# Patient Record
Sex: Female | Born: 1975 | Race: Black or African American | Hispanic: No | Marital: Single | Smoking: Never smoker
Health system: Southern US, Community
[De-identification: ages and names within clinical notes are randomized; demographics above are authoritative.]

## PROBLEM LIST (undated history)

## (undated) DIAGNOSIS — E039 Hypothyroidism, unspecified: Secondary | ICD-10-CM

## (undated) DIAGNOSIS — K219 Gastro-esophageal reflux disease without esophagitis: Secondary | ICD-10-CM

## (undated) DIAGNOSIS — D649 Anemia, unspecified: Secondary | ICD-10-CM

## (undated) DIAGNOSIS — Z9289 Personal history of other medical treatment: Secondary | ICD-10-CM

## (undated) DIAGNOSIS — G4730 Sleep apnea, unspecified: Secondary | ICD-10-CM

## (undated) DIAGNOSIS — R011 Cardiac murmur, unspecified: Secondary | ICD-10-CM

## (undated) DIAGNOSIS — T7840XA Allergy, unspecified, initial encounter: Secondary | ICD-10-CM

## (undated) DIAGNOSIS — J302 Other seasonal allergic rhinitis: Secondary | ICD-10-CM

## (undated) DIAGNOSIS — M13169 Monoarthritis, not elsewhere classified, unspecified knee: Secondary | ICD-10-CM

## (undated) DIAGNOSIS — E538 Deficiency of other specified B group vitamins: Secondary | ICD-10-CM

## (undated) DIAGNOSIS — Z8489 Family history of other specified conditions: Secondary | ICD-10-CM

## (undated) HISTORY — DX: Anemia, unspecified: D64.9

## (undated) HISTORY — DX: Gastro-esophageal reflux disease without esophagitis: K21.9

## (undated) HISTORY — DX: Monoarthritis, not elsewhere classified, unspecified knee: M13.169

## (undated) HISTORY — DX: Allergy, unspecified, initial encounter: T78.40XA

## (undated) HISTORY — DX: Deficiency of other specified B group vitamins: E53.8

---

## 1998-05-25 ENCOUNTER — Emergency Department (HOSPITAL_COMMUNITY): Admission: EM | Admit: 1998-05-25 | Discharge: 1998-05-25 | Payer: Self-pay

## 1998-06-16 ENCOUNTER — Encounter: Admission: RE | Admit: 1998-06-16 | Discharge: 1998-09-14 | Payer: Self-pay

## 1998-11-17 ENCOUNTER — Emergency Department (HOSPITAL_COMMUNITY): Admission: EM | Admit: 1998-11-17 | Discharge: 1998-11-17 | Payer: Self-pay | Admitting: Emergency Medicine

## 1998-11-18 ENCOUNTER — Encounter: Admission: RE | Admit: 1998-11-18 | Discharge: 1999-02-16 | Payer: Self-pay | Admitting: Family Medicine

## 1999-11-13 HISTORY — PX: GASTRIC BYPASS: SHX52

## 2001-04-11 ENCOUNTER — Encounter: Payer: Self-pay | Admitting: Family Medicine

## 2001-04-11 ENCOUNTER — Encounter: Admission: RE | Admit: 2001-04-11 | Discharge: 2001-04-11 | Payer: Self-pay | Admitting: Family Medicine

## 2001-04-28 ENCOUNTER — Encounter: Admission: RE | Admit: 2001-04-28 | Discharge: 2001-07-27 | Payer: Self-pay | Admitting: Family Medicine

## 2002-11-12 DIAGNOSIS — Z9289 Personal history of other medical treatment: Secondary | ICD-10-CM

## 2002-11-12 HISTORY — PX: ABDOMINOPLASTY: SHX5355

## 2002-11-12 HISTORY — PX: REDUCTION MAMMAPLASTY: SUR839

## 2002-11-12 HISTORY — DX: Personal history of other medical treatment: Z92.89

## 2003-09-28 DIAGNOSIS — Z9884 Bariatric surgery status: Secondary | ICD-10-CM | POA: Insufficient documentation

## 2003-11-13 HISTORY — PX: BREAST REDUCTION SURGERY: SHX8

## 2003-12-16 ENCOUNTER — Other Ambulatory Visit: Admission: RE | Admit: 2003-12-16 | Discharge: 2003-12-16 | Payer: Self-pay | Admitting: Family Medicine

## 2006-06-13 ENCOUNTER — Other Ambulatory Visit: Admission: RE | Admit: 2006-06-13 | Discharge: 2006-06-13 | Payer: Self-pay | Admitting: Family Medicine

## 2006-06-13 ENCOUNTER — Ambulatory Visit: Payer: Self-pay | Admitting: Family Medicine

## 2006-07-03 ENCOUNTER — Ambulatory Visit: Payer: Self-pay | Admitting: Gastroenterology

## 2006-07-04 ENCOUNTER — Ambulatory Visit (HOSPITAL_COMMUNITY): Admission: RE | Admit: 2006-07-04 | Discharge: 2006-07-04 | Payer: Self-pay | Admitting: Gastroenterology

## 2006-07-08 ENCOUNTER — Ambulatory Visit: Payer: Self-pay | Admitting: Gastroenterology

## 2006-07-12 ENCOUNTER — Ambulatory Visit: Payer: Self-pay | Admitting: Cardiology

## 2008-01-21 ENCOUNTER — Telehealth (INDEPENDENT_AMBULATORY_CARE_PROVIDER_SITE_OTHER): Payer: Self-pay | Admitting: *Deleted

## 2011-02-01 ENCOUNTER — Other Ambulatory Visit: Payer: Self-pay | Admitting: Gastroenterology

## 2011-02-02 ENCOUNTER — Other Ambulatory Visit: Payer: Self-pay | Admitting: Gastroenterology

## 2011-02-02 ENCOUNTER — Ambulatory Visit
Admission: RE | Admit: 2011-02-02 | Discharge: 2011-02-02 | Disposition: A | Discharge status: Home / Self Care | Payer: BC Managed Care – PPO | Source: Ambulatory Visit | Attending: Gastroenterology | Admitting: Gastroenterology

## 2011-09-25 ENCOUNTER — Ambulatory Visit: Payer: BC Managed Care – PPO | Attending: Obstetrics & Gynecology | Admitting: Physical Therapy

## 2011-09-25 DIAGNOSIS — R5381 Other malaise: Secondary | ICD-10-CM | POA: Insufficient documentation

## 2011-09-25 DIAGNOSIS — M2550 Pain in unspecified joint: Secondary | ICD-10-CM | POA: Insufficient documentation

## 2011-09-25 DIAGNOSIS — IMO0001 Reserved for inherently not codable concepts without codable children: Secondary | ICD-10-CM | POA: Insufficient documentation

## 2011-09-25 DIAGNOSIS — M6281 Muscle weakness (generalized): Secondary | ICD-10-CM | POA: Insufficient documentation

## 2011-10-09 ENCOUNTER — Ambulatory Visit: Payer: BC Managed Care – PPO | Admitting: Physical Therapy

## 2011-10-23 ENCOUNTER — Ambulatory Visit: Payer: BC Managed Care – PPO | Attending: Obstetrics & Gynecology | Admitting: Physical Therapy

## 2011-10-23 DIAGNOSIS — M6281 Muscle weakness (generalized): Secondary | ICD-10-CM | POA: Insufficient documentation

## 2011-10-23 DIAGNOSIS — IMO0001 Reserved for inherently not codable concepts without codable children: Secondary | ICD-10-CM | POA: Insufficient documentation

## 2011-10-23 DIAGNOSIS — M2550 Pain in unspecified joint: Secondary | ICD-10-CM | POA: Insufficient documentation

## 2011-10-23 DIAGNOSIS — R5381 Other malaise: Secondary | ICD-10-CM | POA: Insufficient documentation

## 2011-11-08 ENCOUNTER — Ambulatory Visit: Payer: BC Managed Care – PPO | Admitting: Physical Therapy

## 2011-11-20 ENCOUNTER — Ambulatory Visit: Payer: BC Managed Care – PPO | Attending: Obstetrics & Gynecology | Admitting: Physical Therapy

## 2011-11-20 DIAGNOSIS — M6281 Muscle weakness (generalized): Secondary | ICD-10-CM | POA: Insufficient documentation

## 2011-11-20 DIAGNOSIS — IMO0001 Reserved for inherently not codable concepts without codable children: Secondary | ICD-10-CM | POA: Insufficient documentation

## 2011-11-20 DIAGNOSIS — M2550 Pain in unspecified joint: Secondary | ICD-10-CM | POA: Insufficient documentation

## 2011-11-20 DIAGNOSIS — R5381 Other malaise: Secondary | ICD-10-CM | POA: Insufficient documentation

## 2011-11-28 ENCOUNTER — Ambulatory Visit: Payer: BC Managed Care – PPO | Admitting: Physical Therapy

## 2011-12-05 ENCOUNTER — Ambulatory Visit: Payer: BC Managed Care – PPO | Admitting: Physical Therapy

## 2011-12-11 ENCOUNTER — Ambulatory Visit: Payer: BC Managed Care – PPO | Admitting: Physical Therapy

## 2011-12-18 ENCOUNTER — Ambulatory Visit: Payer: BC Managed Care – PPO | Attending: Obstetrics & Gynecology | Admitting: Physical Therapy

## 2011-12-18 DIAGNOSIS — IMO0001 Reserved for inherently not codable concepts without codable children: Secondary | ICD-10-CM | POA: Insufficient documentation

## 2011-12-18 DIAGNOSIS — M6281 Muscle weakness (generalized): Secondary | ICD-10-CM | POA: Insufficient documentation

## 2011-12-18 DIAGNOSIS — M2550 Pain in unspecified joint: Secondary | ICD-10-CM | POA: Insufficient documentation

## 2011-12-18 DIAGNOSIS — R5381 Other malaise: Secondary | ICD-10-CM | POA: Insufficient documentation

## 2011-12-25 ENCOUNTER — Ambulatory Visit: Payer: BC Managed Care – PPO | Admitting: Physical Therapy

## 2012-01-08 ENCOUNTER — Ambulatory Visit: Payer: BC Managed Care – PPO | Admitting: Physical Therapy

## 2012-01-15 ENCOUNTER — Ambulatory Visit: Payer: BC Managed Care – PPO | Attending: Obstetrics & Gynecology | Admitting: Physical Therapy

## 2012-01-15 DIAGNOSIS — IMO0001 Reserved for inherently not codable concepts without codable children: Secondary | ICD-10-CM | POA: Insufficient documentation

## 2012-01-15 DIAGNOSIS — M2550 Pain in unspecified joint: Secondary | ICD-10-CM | POA: Insufficient documentation

## 2012-01-15 DIAGNOSIS — M6281 Muscle weakness (generalized): Secondary | ICD-10-CM | POA: Insufficient documentation

## 2012-01-15 DIAGNOSIS — R5381 Other malaise: Secondary | ICD-10-CM | POA: Insufficient documentation

## 2012-11-12 HISTORY — PX: WISDOM TOOTH EXTRACTION: SHX21

## 2013-02-02 ENCOUNTER — Encounter: Payer: Self-pay | Admitting: Obstetrics & Gynecology

## 2013-02-02 DIAGNOSIS — M25559 Pain in unspecified hip: Secondary | ICD-10-CM

## 2013-03-26 ENCOUNTER — Encounter: Payer: Self-pay | Admitting: Obstetrics & Gynecology

## 2013-03-26 ENCOUNTER — Ambulatory Visit (INDEPENDENT_AMBULATORY_CARE_PROVIDER_SITE_OTHER): Payer: Managed Care, Other (non HMO) | Admitting: Obstetrics & Gynecology

## 2013-03-26 VITALS — BP 116/80 | HR 36 | Resp 12 | Wt 245.0 lb

## 2013-03-26 DIAGNOSIS — N949 Unspecified condition associated with female genital organs and menstrual cycle: Secondary | ICD-10-CM

## 2013-03-26 DIAGNOSIS — M25559 Pain in unspecified hip: Secondary | ICD-10-CM

## 2013-03-26 DIAGNOSIS — N938 Other specified abnormal uterine and vaginal bleeding: Secondary | ICD-10-CM

## 2013-03-26 MED ORDER — NORETHIN ACE-ETH ESTRAD-FE 1-20 MG-MCG(24) PO CHEW: 1.0000 | CHEWABLE_TABLET | Freq: Every day | ORAL | Status: DC

## 2013-03-26 NOTE — Patient Instructions (Signed)
Please call if you have any new issues or problems.    Don't forget to take the information to the pharmacy about the savings.  BIN # [id number], PCN# [id number], Grp# [id number], ID# [id number]

## 2013-03-26 NOTE — Progress Notes (Signed)
37 y.o. Single African American G0P0000here for evaluation of irregular bleeding and pelvic pain.  She started Minastrin in March and has been through two packs.  Cycles are much lighter, mostly just spotting when wipes.  Lasts three days.  Cramping is much better.  All of the pain that was occuring not during her cycle is also much better.    Just bought her first house.  Closed on Monday.  Off work this week to move into her place.  This has been a busy week for her.    Patient taking medication as prescribed. Denies missed pills: yes, headaches: occasional but she is working more just in front of a computer, nausea no, DVT warning signs or symptoms: no, breakthrough bleeding: no, or other changes no.  Keeping menses calendar. No other health issues today.  Down 12 pounds since March.  Exercising regularly.  Not doing weights as much.    O: Healthy female, WD WN Affect: normal orientation X 3    A:  DUB and pelvic pain, much improved on Minastrin  P: Continue Minastrin.  3 samples given.  Pt will call and let me know if needs 90 day supply through [facility].   10 minutes spent with patient with >50% of time spent in face to face counseling.  RV

## 2013-06-29 ENCOUNTER — Telehealth: Payer: Self-pay | Admitting: Gynecology

## 2013-06-29 ENCOUNTER — Telehealth: Payer: Self-pay | Admitting: Obstetrics & Gynecology

## 2013-06-29 NOTE — Telephone Encounter (Signed)
Needs refill of Minastrin called into [facility].

## 2013-06-29 NOTE — Telephone Encounter (Signed)
Left Message To Call Back  

## 2013-06-29 NOTE — Telephone Encounter (Signed)
Minastrin 24 fe #28/12 refills was sent through to [facility] 03/26/13 #28/12rfs, s/w patient she is wanting to have her rx filled at [facility], told patient that she can call [facility] and have her rx transferred. Patient said she would do this, and is aware to call us back if she has any issues or problems.

## 2014-04-02 ENCOUNTER — Encounter: Payer: Self-pay | Admitting: Obstetrics & Gynecology

## 2014-04-02 ENCOUNTER — Ambulatory Visit (INDEPENDENT_AMBULATORY_CARE_PROVIDER_SITE_OTHER): Payer: Managed Care, Other (non HMO) | Admitting: Obstetrics & Gynecology

## 2014-04-02 VITALS — BP 124/78 | HR 58 | Resp 16 | Ht 66.0 in | Wt 250.4 lb

## 2014-04-02 DIAGNOSIS — N839 Noninflammatory disorder of ovary, fallopian tube and broad ligament, unspecified: Secondary | ICD-10-CM

## 2014-04-02 DIAGNOSIS — R790 Abnormal level of blood mineral: Secondary | ICD-10-CM

## 2014-04-02 DIAGNOSIS — R7989 Other specified abnormal findings of blood chemistry: Secondary | ICD-10-CM

## 2014-04-02 DIAGNOSIS — R5381 Other malaise: Secondary | ICD-10-CM

## 2014-04-02 DIAGNOSIS — Z01419 Encounter for gynecological examination (general) (routine) without abnormal findings: Secondary | ICD-10-CM

## 2014-04-02 DIAGNOSIS — Z0000 Encounter for general adult medical examination without abnormal findings: Secondary | ICD-10-CM

## 2014-04-02 DIAGNOSIS — Z124 Encounter for screening for malignant neoplasm of cervix: Secondary | ICD-10-CM

## 2014-04-02 DIAGNOSIS — R5383 Other fatigue: Secondary | ICD-10-CM

## 2014-04-02 DIAGNOSIS — N838 Other noninflammatory disorders of ovary, fallopian tube and broad ligament: Secondary | ICD-10-CM

## 2014-04-02 LAB — TSH: TSH: 0.629 u[IU]/mL (ref 0.350–4.500)

## 2014-04-02 LAB — POCT URINALYSIS DIPSTICK
BILIRUBIN UA: NEGATIVE
Blood, UA: NEGATIVE
Glucose, UA: NEGATIVE
KETONES UA: NEGATIVE
LEUKOCYTES UA: NEGATIVE
Nitrite, UA: NEGATIVE
PROTEIN UA: NEGATIVE
Urobilinogen, UA: NEGATIVE
pH, UA: 5

## 2014-04-02 LAB — COMPREHENSIVE METABOLIC PANEL
ALBUMIN: 3.5 g/dL (ref 3.5–5.2)
ALT: 16 U/L (ref 0–35)
AST: 16 U/L (ref 0–37)
Alkaline Phosphatase: 72 U/L (ref 39–117)
BUN: 9 mg/dL (ref 6–23)
CALCIUM: 9.2 mg/dL (ref 8.4–10.5)
CHLORIDE: 107 meq/L (ref 96–112)
CO2: 23 meq/L (ref 19–32)
Creat: 0.87 mg/dL (ref 0.50–1.10)
Glucose, Bld: 83 mg/dL (ref 70–99)
POTASSIUM: 4 meq/L (ref 3.5–5.3)
Sodium: 139 mEq/L (ref 135–145)
TOTAL PROTEIN: 7 g/dL (ref 6.0–8.3)
Total Bilirubin: 0.4 mg/dL (ref 0.2–1.2)

## 2014-04-02 LAB — IRON: IRON: 84 ug/dL (ref 42–145)

## 2014-04-02 LAB — FERRITIN: FERRITIN: 5 ng/mL — AB (ref 10–291)

## 2014-04-02 LAB — VITAMIN B12: Vitamin B-12: 280 pg/mL (ref 211–911)

## 2014-04-02 LAB — HEMOGLOBIN, FINGERSTICK: HEMOGLOBIN, FINGERSTICK: 12.2 g/dL (ref 12.0–16.0)

## 2014-04-02 MED ORDER — NORETHIN ACE-ETH ESTRAD-FE 1-20 MG-MCG(24) PO CHEW: 1.0000 | CHEWABLE_TABLET | Freq: Every day | ORAL | Status: DC

## 2014-04-02 NOTE — Progress Notes (Signed)
38 y.o. G2P0 SingleAfrican AmericanF here for annual exam.  Is much better!!  Would like to stay on OCP.  Trying to figure out if she can do mail order.  Having some more fatigue.     Patient's last menstrual period was 01/10/2013.          Sexually active: no  The current method of family planning is OCP (estrogen/progesterone).    Exercising: yes  weights, walking, and running Smoker:  no  Health Maintenance: Pap:  01/14/12 WNL/negative HR HPV History of abnormal Pap:  no MMG:  Per patient ? 02/09/11-normal Colonoscopy:  none BMD:   none TDaP:  01/11/11 Screening Labs: today, Hb today: 12.2, Urine today: negative   reports that she has never smoked. She has never used smokeless tobacco. She reports that she does not drink alcohol or use illicit drugs.  Past Medical History  Diagnosis Date  . Anemia     and low B12    Past Surgical History  Procedure Laterality Date  . Gastric bypass  2001  . Abdominoplasty  2004  . Breast reduction surgery  2005    Current Outpatient Prescriptions  Medication Sig Dispense Refill  . montelukast (SINGULAIR) 10 MG tablet Take 10 mg by mouth at bedtime.      . Norethin Ace-Eth Estrad-FE (MINASTRIN 24 FE) 1-20 MG-MCG(24) CHEW Chew 1 tablet by mouth daily.  28 tablet  12  . ferrous sulfate 220 (44 FE) MG/5ML solution Take 220 mg by mouth daily.      . Vitamin D, Ergocalciferol, (DRISDOL) 50000 UNITS CAPS Take 50,000 Units by mouth. Twice weekly       No current facility-administered medications for this visit.    Family History  Problem Relation Age of Onset  . CVA Maternal Grandmother   . Hypertension Brother   . Diabetes Brother   . Heart disease Brother   . Diabetes Sister   . Hypertension Sister   . Diabetes Sister   . Hypertension Sister   . Diabetes Brother   . Hypertension Brother   . Heart disease Brother   . Diabetes Brother   . Hypertension Brother   . Heart disease Brother     ROS:  Pertinent items are noted in HPI.   Otherwise, a comprehensive ROS was negative.  Exam:   BP 124/78  Pulse 58  Resp 16  Ht 5\' 6"  (1.676 m)  Wt 250 lb 6.4 oz (113.581 kg)  BMI 40.44 kg/m2  LMP 01/10/2013 Height: 5\' 6"  (167.6 cm)  Ht Readings from Last 3 Encounters:  04/02/14 5\' 6"  (1.676 m)    General appearance: alert, cooperative and appears stated age Head: Normocephalic, without obvious abnormality, atraumatic Neck: no adenopathy, supple, symmetrical, trachea midline and thyroid normal to inspection and palpation Lungs: clear to auscultation bilaterally Breasts: normal appearance, no masses or tenderness Heart: regular rate and rhythm Abdomen: soft, non-tender; bowel sounds normal; no masses,  no organomegaly Extremities: extremities normal, atraumatic, no cyanosis or edema Skin: Skin color, texture, turgor normal. No rashes or lesions Lymph nodes: Cervical, supraclavicular, and axillary nodes normal. No abnormal inguinal nodes palpated Neurologic: Grossly normal   Pelvic: External genitalia:  no lesions              Urethra:  normal appearing urethra with no masses, tenderness or lesions              Bartholins and Skenes: normal  Vagina: normal appearing vagina with normal color and discharge, no lesions              Cervix: no lesions              Pap taken: yes Bimanual Exam:  Uterus:  normal size, contour, position, consistency, mobility, non-tender              Adnexa: fullness on right size, about 4cm               Rectovaginal: Confirms               Anus:  normal sphincter tone, no lesions  A:  Well Woman with normal exam Amenorrhea on OCPs Chronic pelvic pain, improved with current OCP Fatigue Right enlarged ovary?  H/o 3cm right ovarian cyst on 1/14 PUS  P:   Mammogram recommended.  Information given for starting screening pap smear only  Iron studies, Vit B12 CMP, TSH, Vit D Rx for Minastrin 24 to pharmacy.  90 day supply given. Will need rx for Vit D.  Will wait to see  lab results. Recheck PUS to assess size of ovary. return annually or prn  An After Visit Summary was printed and given to the patient.

## 2014-04-03 LAB — VITAMIN D 25 HYDROXY (VIT D DEFICIENCY, FRACTURES): VIT D 25 HYDROXY: 33 ng/mL (ref 30–89)

## 2014-04-06 ENCOUNTER — Telehealth: Payer: Self-pay | Admitting: Obstetrics & Gynecology

## 2014-04-06 ENCOUNTER — Telehealth: Payer: Self-pay

## 2014-04-06 MED ORDER — FERROUS SULFATE 220 (44 FE) MG/5ML PO ELIX: 220.0000 mg | ORAL_SOLUTION | Freq: Every day | ORAL | Status: DC

## 2014-04-06 NOTE — Telephone Encounter (Signed)
Spoke with patient. Advised that per benefit quote received, she will be responsible for  $378.06 when she comes in for pus. Patient agreeable. Scheduled pus. Advised of cx policy.

## 2014-04-06 NOTE — Telephone Encounter (Signed)
Lmtcb//kn 

## 2014-04-06 NOTE — Addendum Note (Signed)
Addended by: Megan Salon on: 04/06/2014 07:20 AM   Modules accepted: Orders

## 2014-04-06 NOTE — Addendum Note (Signed)
Addended by: Megan Salon on: 04/06/2014 11:39 AM   Modules accepted: Orders

## 2014-04-06 NOTE — Telephone Encounter (Signed)
Message copied by Robley Fries on Tue Apr 06, 2014  8:55 AM ------      Message from: Megan Salon      Created: Tue Apr 06, 2014  7:19 AM       Inform CMP normal.  TSH normal.  Vit D normal.  B12 normal.  Ferriting (iron stores) very low.  Needs to take FeSo4 325mg  daily and repeat in 2 months.  Order placed.  If not a lot better then, may need IV iron infusion. ------

## 2014-04-07 LAB — IPS PAP SMEAR ONLY

## 2014-04-08 ENCOUNTER — Ambulatory Visit (INDEPENDENT_AMBULATORY_CARE_PROVIDER_SITE_OTHER): Payer: Managed Care, Other (non HMO) | Admitting: Obstetrics & Gynecology

## 2014-04-08 ENCOUNTER — Ambulatory Visit (INDEPENDENT_AMBULATORY_CARE_PROVIDER_SITE_OTHER): Payer: Managed Care, Other (non HMO)

## 2014-04-08 ENCOUNTER — Encounter: Payer: Self-pay | Admitting: Obstetrics & Gynecology

## 2014-04-08 VITALS — BP 120/78 | Ht 66.0 in | Wt 253.0 lb

## 2014-04-08 DIAGNOSIS — N838 Other noninflammatory disorders of ovary, fallopian tube and broad ligament: Secondary | ICD-10-CM

## 2014-04-08 DIAGNOSIS — N839 Noninflammatory disorder of ovary, fallopian tube and broad ligament, unspecified: Secondary | ICD-10-CM

## 2014-04-08 NOTE — Progress Notes (Signed)
38 yo G2A2 SAAF here for pelvic ultrasound due to palpable right adnexal mass noted on physical exam 04/08/14.  H/o ultrasound 11/24/12 here showing thin walled avascular cyst 33 x 27 x 36 mm on right side.  Patient's last menstrual period was 01/10/2013.  Sexually active:  no  Contraception: oral contraceptives (estrogen/progesterone)  FINDINGS: UTERUS: 5.7 x 4.0 x 2.3cm.  No masses noted. EMS: 3.18mm ADNEXA:   Left ovary 2.6 x 1.4 x 1.3cm   Right ovary 2.2 x 1.9 x 1.7cm with 3.6 x 2.9 x 3.4cm avascular cyst. CUL DE SAC: no free fluid  Images reviewed with patient.  Comparison made to prior ultrasound done 11/24/12.  As this has not changed significantly in size, feel monitoring vs surgical excision is appropriate.  Pt continues to have pain on right side, it just isn't as severe as a year ago.  She would very much like this removed as she does nto want to proceed with continued observation.  Procedure, incision locations, risks and benefits including bleeding, infection, bowel/bladder/ureteral injury discussed.  Pt in agreement with proceeding.    Assessment:  Ovarian mass Plan: Will test ca-125 today.  If normal, proceed with surgery scheduling.  ~20 minutes spent with patient >50% of time was in face to face discussion of above.

## 2014-04-09 LAB — CA 125: CA 125: 4.3 U/mL (ref 0.0–30.2)

## 2014-04-12 ENCOUNTER — Telehealth: Payer: Self-pay | Admitting: *Deleted

## 2014-04-12 NOTE — Telephone Encounter (Signed)
Call to patient, LMTCB

## 2014-04-12 NOTE — Telephone Encounter (Signed)
Message copied by Jaymes Graff on Mon Apr 12, 2014  4:39 PM ------      Message from: Megan Salon      Created: Fri Apr 09, 2014  6:39 PM       Inform pt this is normal.  Ok to proceed with scheduling laparoscopic ovarian cyst removal. ------

## 2014-04-13 NOTE — Telephone Encounter (Signed)
Patient notified of all results.//kn 

## 2014-04-13 NOTE — Telephone Encounter (Signed)
Left message for patient to call back. Need to go over surgery benefits. °

## 2014-04-13 NOTE — Telephone Encounter (Signed)
Returning a call to Sheri [name]. °

## 2014-04-13 NOTE — Telephone Encounter (Signed)
Patient returning Sally's call. °

## 2014-04-13 NOTE — Telephone Encounter (Signed)
Patient notified of normal CA125 and ok to proceed with scheduling surgery.  Per patient request, also reviewed iron results and ferritin from last week. Patient is still waiting for liquid iron RX to arrive from mail order.  She is interested in date of late June or early July but will take whatever we can get. Advised Gabriel Cirri will check insurance and call her back.

## 2014-04-13 NOTE — Telephone Encounter (Signed)
Spoke with patient. Advised that per benefit quote received, she will be responsible for $1035.68. Advised that payment is due in full at least 2 weeks prior to scheduled surgery date. Patient agreeable. She will be waiting to hear from [facility] regarding scheduling.

## 2014-04-14 MED ORDER — FERROUS SULFATE 220 (44 FE) MG/5ML PO ELIX: 220.0000 mg | ORAL_SOLUTION | Freq: Every day | ORAL | Status: DC

## 2014-04-14 NOTE — Telephone Encounter (Signed)
Spoke with patient. Patient states that she had a rx for iron sent in by Dr.Miller that was sent over to [facility] but they will not fill it. Patient would like rx sent over the [facility] in [facility]. Patient states "I have been waiting about a week and I do not want to have to have an infusion before surgery." Advised patient that rx was reordered to pharmacy of choice. Advised patient to call back with any further needs or questions. Patient agreeable.

## 2014-04-19 NOTE — Telephone Encounter (Signed)
Pt says she is calling the nurse or Gay Filler to schedule surgery.

## 2014-04-19 NOTE — Telephone Encounter (Signed)
Per DPR, Ok to leave message. LM returning call and apolgized for delay.Working on request and will be back in touch with her mid-week.  Case request sent to hosp for 05-17-14, first available.

## 2014-04-19 NOTE — Telephone Encounter (Signed)
Routing to [facility] for surgery scheduling.

## 2014-04-26 NOTE — Telephone Encounter (Signed)
Pt calling to schedule surgery.

## 2014-04-29 NOTE — Telephone Encounter (Signed)
Call to patient. Advised of surgical date of 05-17-14 at 22 at [facility].  Surgery instruction sheet reviewed and mailed to patient.  Surgery consult scheduled for week prior 05-11-14 so you can recheck iron if needed.  Will she need any type of bowel prep?

## 2014-04-29 NOTE — Telephone Encounter (Signed)
Call to patient to notify, LMTCB.

## 2014-04-29 NOTE — Telephone Encounter (Signed)
No bowel prep.  Thank you.

## 2014-04-30 NOTE — Telephone Encounter (Signed)
*  corrected patient name on letter: Dear [name],   Your surgery is scheduled for May 17, 2014.  Upon requesting authorization for your surgery, your insurance company has informed us that they will cover 60% of the charges after a $1700 deductible, and you will be responsible to pay approximately $1035.68  It is our office policy that this amount is paid in full two weeks prior to your surgery. Your payment is due on May 03, 2014.  If there is a balance due after your insurance company pays their portion, we will send you a bill.  If there is a refund due to you, we will send you a check within one month.  Payment may be made by cash, check, Visa, Nurse, learning disability. Payment can be made in the office or over the telephone.  If payment is not made two weeks prior to your surgery, we will have to reschedule your surgery.  The above fee includes only our fee for the surgery and does not include charges you may have from the facility, anesthesia or pathology.  If you have any questions, please call us at [phone].

## 2014-04-30 NOTE — Telephone Encounter (Signed)
April 30, 2014   Dear Ms. Marguerita Merles,   Your surgery is scheduled for May 17, 2014.  Upon requesting authorization for your surgery, your insurance company has informed us that they will cover 60% of the charges after a $1700 deductible, and you will be responsible to pay approximately $1035.68  It is our office policy that this amount is paid in full two weeks prior to your surgery. Your payment is due on May 03, 2014.  If there is a balance due after your insurance company pays their portion, we will send you a bill.  If there is a refund due to you, we will send you a check within one month.  Payment may be made by cash, check, Visa, Nurse, learning disability. Payment can be made in the office or over the telephone.  If payment is not made two weeks prior to your surgery, we will have to reschedule your surgery.  The above fee includes only our fee for the surgery and does not include charges you may have from the facility, anesthesia or pathology.  If you have any questions, please call us at [phone].

## 2014-04-30 NOTE — Telephone Encounter (Signed)
Call to patient, Per DPR, ok to  Leave detailed message on [phone]. LM calling to follow up from yesterday and advised dr [name] Heck said no bowel prep needed with the exception of NPO past midnight. Surgery instruction sheet mailed as confirmation. Call if questions. Encounter closed.

## 2014-05-05 ENCOUNTER — Encounter (HOSPITAL_COMMUNITY): Payer: Self-pay | Admitting: Pharmacist

## 2014-05-11 ENCOUNTER — Ambulatory Visit (INDEPENDENT_AMBULATORY_CARE_PROVIDER_SITE_OTHER): Payer: Managed Care, Other (non HMO) | Admitting: Obstetrics & Gynecology

## 2014-05-11 ENCOUNTER — Encounter: Payer: Self-pay | Admitting: Obstetrics & Gynecology

## 2014-05-11 ENCOUNTER — Telehealth: Payer: Self-pay | Admitting: Obstetrics & Gynecology

## 2014-05-11 VITALS — BP 104/64 | HR 60 | Resp 16 | Ht 66.0 in | Wt 250.4 lb

## 2014-05-11 DIAGNOSIS — N83209 Unspecified ovarian cyst, unspecified side: Secondary | ICD-10-CM

## 2014-05-11 MED ORDER — MORPHINE SULFATE 15 MG PO TABS: 15.0000 mg | ORAL_TABLET | ORAL | Status: DC | PRN

## 2014-05-11 MED ORDER — NORETHIN ACE-ETH ESTRAD-FE 1-20 MG-MCG(24) PO CHEW: 1.0000 | CHEWABLE_TABLET | Freq: Every day | ORAL | Status: DC

## 2014-05-11 NOTE — Progress Notes (Signed)
38 y.o. G2P0 SingleAfrican American female here for discussion of upcoming procedure.  Laparoscopic ovarian cystectomy planned due to persistent 4cm right ovarian cyst.  This has been imaged twice with no change in the cyst size.  Pt had RLQ pain that initiated the original evaluation.       Procedure discussed.  Incision locations discussed.  Risks and benefits discussed including but not limited to bleeding, 1% risk of receiving a  transfusion, infection, 3-4% risk of bowel/bladder/ureteral/vascular injury discussed as well as possible need for additional surgery if injury does occur discussed.  DVT/PE and rare risk of death discussed.  My actual complications with prior surgeries discussed.  Hernia formation discussed.  Positioning discussed.  Patient aware if pathology abnormal she may need additional treatment.  All questions answered.  Pt's mother was with her for entire discussion.   Ob Hx:   No LMP recorded. Patient is not currently having periods (Reason: Oral contraceptives).          Sexually active: No. Birth control: oral contraceptives (estrogen/progesterone) Last pap: 04/02/14 WNL Last MMG: none Tobacco: no Transfusion:  2001, after bypass  Past Surgical History  Procedure Laterality Date  . Gastric bypass  2001  . Abdominoplasty  2004  . Breast reduction surgery  2005    Past Medical History  Diagnosis Date  . Anemia     and low B12    Allergies: Aspirin  Current Outpatient Prescriptions  Medication Sig Dispense Refill  . ferrous sulfate 220 (44 FE) MG/5ML solution Take 5 mLs (220 mg total) by mouth daily.  480 mL  1  . montelukast (SINGULAIR) 10 MG tablet Take 10 mg by mouth at bedtime.      . Norethin Ace-Eth Estrad-FE (MINASTRIN 24 FE) 1-20 MG-MCG(24) CHEW Chew 1 tablet by mouth daily.  84 tablet  4   No current facility-administered medications for this visit.    ROS: A comprehensive review of systems was negative.  Exam:    BP 104/64  Pulse 60  Resp 16   Ht 5\' 6"  (1.676 m)  Wt 250 lb 6.4 oz (113.581 kg)  BMI 40.44 kg/m2  General appearance: alert and cooperative Head: Normocephalic, without obvious abnormality, atraumatic Neck: no adenopathy, supple, symmetrical, trachea midline and thyroid not enlarged, symmetric, no tenderness/mass/nodules Lungs: clear to auscultation bilaterally Heart: regular rate and rhythm, S1, S2 normal, no murmur, click, rub or gallop Abdomen: soft, non-tender; bowel sounds normal; no masses,  no organomegaly Extremities: extremities normal, atraumatic, no cyanosis or edema Skin: Skin color, texture, turgor normal. No rashes or lesions Lymph nodes: Cervical, supraclavicular, and axillary nodes normal. no inguinal nodes palpated Neurologic: Grossly normal  Pelvic: External genitalia:  no lesions              Urethra: normal appearing urethra with no masses, tenderness or lesions              Bartholins and Skenes: normal                 Vagina: normal appearing vagina with normal color and discharge, no lesions                A: 4cm right ovarian cyst, RLQ pain     P:  Laparoscopic right ovarian cystectomy planned Rx for MSIR Will check CBC and ferritin with pre-op labs Make sure before surgery, any metal in hair braids is removed/covered Medications/Vitamins reviewed.  Pt knows needs to stop Aleve.  ~25 minutes spent with patient >  50% of time was in face to face discussion of above.

## 2014-05-11 NOTE — Telephone Encounter (Signed)
Patient states she is bringing payment today

## 2014-05-11 NOTE — Telephone Encounter (Signed)
Left message for patient to call back. Need to collect her surgery payment of $1035.68.

## 2014-05-12 NOTE — Patient Instructions (Signed)
   Your procedure is scheduled on: Monday, July 6  Enter through the Main Entrance of [facility] at:  9 AM Pick up the phone at the desk and dial [phone] and inform us of your arrival.  Please call this number if you have any problems the morning of surgery: [phone]  Remember: Do not eat or drink after midnight: Sunday Take these medicines the morning of surgery with a SIP OF WATER:  Do not wear jewelry, make-up, or FINGER nail polish No metal in your hair or on your body. Do not wear lotions, powders, perfumes.  You may wear deodorant.  Do not bring valuables to the hospital. Contacts, dentures or bridgework may not be worn into surgery.  Patients discharged on the day of surgery will not be allowed to drive home.

## 2014-05-13 ENCOUNTER — Encounter (HOSPITAL_COMMUNITY)
Admission: RE | Admit: 2014-05-13 | Discharge: 2014-05-13 | Disposition: A | Discharge status: Home / Self Care | Payer: Managed Care, Other (non HMO) | Source: Ambulatory Visit | Attending: Obstetrics & Gynecology | Admitting: Obstetrics & Gynecology

## 2014-05-13 ENCOUNTER — Encounter (HOSPITAL_COMMUNITY): Payer: Self-pay

## 2014-05-13 ENCOUNTER — Telehealth: Payer: Self-pay | Admitting: Obstetrics & Gynecology

## 2014-05-13 DIAGNOSIS — Z01812 Encounter for preprocedural laboratory examination: Secondary | ICD-10-CM | POA: Insufficient documentation

## 2014-05-13 HISTORY — DX: Cardiac murmur, unspecified: R01.1

## 2014-05-13 HISTORY — DX: Other seasonal allergic rhinitis: J30.2

## 2014-05-13 HISTORY — DX: Hypothyroidism, unspecified: E03.9

## 2014-05-13 HISTORY — DX: Personal history of other medical treatment: Z92.89

## 2014-05-13 LAB — CBC
HEMATOCRIT: 38.8 % (ref 36.0–46.0)
Hemoglobin: 12.5 g/dL (ref 12.0–15.0)
MCH: 27.7 pg (ref 26.0–34.0)
MCHC: 32.2 g/dL (ref 30.0–36.0)
MCV: 86 fL (ref 78.0–100.0)
Platelets: 290 10*3/uL (ref 150–400)
RBC: 4.51 MIL/uL (ref 3.87–5.11)
RDW: 15.8 % — AB (ref 11.5–15.5)
WBC: 7 10*3/uL (ref 4.0–10.5)

## 2014-05-13 NOTE — Telephone Encounter (Signed)
Per Levada Dy from [facility], rx has already been called in #[phone] 1 refill  Encounter closed

## 2014-05-13 NOTE — Telephone Encounter (Signed)
Pt is requesting Minastrin 24 Fe to be called into [facility] @ [phone]

## 2014-05-13 NOTE — Patient Instructions (Addendum)
   Your procedure is scheduled on:  Monday, July 6  Enter through the [facility] at: 9 Am Pick up the phone at the desk and dial [phone] and inform us of your arrival.  Please call this number if you have any problems the morning of surgery: [phone]  Remember: Do not eat or drink after midnight: Sunday Take these medicines the morning of surgery with a SIP OF WATER:  liquid iron   Do not wear jewelry, make-up, or FINGER nail polish No metal in your hair or on your body. Do not wear lotions, powders, perfumes.  You may wear deodorant.  Do not bring valuables to the hospital. Contacts, dentures or bridgework may not be worn into surgery.   Patients discharged on the day of surgery will not be allowed to drive home.  Home with mother Sheri [name] cell [phone]

## 2014-05-13 NOTE — Telephone Encounter (Signed)
Dr. [name] Heck escribed rx refill to [facility] on 6/30. Sheri [name], they do not have rx. Called in at this time to Pharmacist Levada Dy and savings card faxed with fax confirmation received.  Patient aware and will pick up rx. Routing to provider for final review. Patient agreeable to disposition. Will close encounter

## 2014-05-13 NOTE — Telephone Encounter (Signed)
Pt is requesting Minastrin 24 Fe to be called into [facility] @ [phone]

## 2014-05-17 ENCOUNTER — Ambulatory Visit (HOSPITAL_COMMUNITY): Payer: Managed Care, Other (non HMO) | Admitting: Anesthesiology

## 2014-05-17 ENCOUNTER — Encounter: Payer: Self-pay | Admitting: Obstetrics & Gynecology

## 2014-05-17 ENCOUNTER — Ambulatory Visit (HOSPITAL_COMMUNITY)
Admission: RE | Admit: 2014-05-17 | Discharge: 2014-05-17 | Disposition: A | Discharge status: Home / Self Care | Payer: Managed Care, Other (non HMO) | Source: Ambulatory Visit | Attending: Obstetrics & Gynecology | Admitting: Obstetrics & Gynecology

## 2014-05-17 ENCOUNTER — Encounter (HOSPITAL_COMMUNITY): Payer: Self-pay | Admitting: Anesthesiology

## 2014-05-17 ENCOUNTER — Encounter (HOSPITAL_COMMUNITY): Payer: Managed Care, Other (non HMO) | Admitting: Anesthesiology

## 2014-05-17 ENCOUNTER — Encounter (HOSPITAL_COMMUNITY)
Admission: RE | Disposition: A | Discharge status: Home / Self Care | Payer: Self-pay | Source: Ambulatory Visit | Attending: Obstetrics & Gynecology

## 2014-05-17 DIAGNOSIS — E039 Hypothyroidism, unspecified: Secondary | ICD-10-CM | POA: Insufficient documentation

## 2014-05-17 DIAGNOSIS — N838 Other noninflammatory disorders of ovary, fallopian tube and broad ligament: Secondary | ICD-10-CM

## 2014-05-17 DIAGNOSIS — Z6841 Body Mass Index (BMI) 40.0 and over, adult: Secondary | ICD-10-CM | POA: Insufficient documentation

## 2014-05-17 DIAGNOSIS — D649 Anemia, unspecified: Secondary | ICD-10-CM | POA: Insufficient documentation

## 2014-05-17 DIAGNOSIS — K219 Gastro-esophageal reflux disease without esophagitis: Secondary | ICD-10-CM | POA: Insufficient documentation

## 2014-05-17 DIAGNOSIS — G8929 Other chronic pain: Secondary | ICD-10-CM | POA: Insufficient documentation

## 2014-05-17 DIAGNOSIS — Z823 Family history of stroke: Secondary | ICD-10-CM | POA: Insufficient documentation

## 2014-05-17 DIAGNOSIS — N9489 Other specified conditions associated with female genital organs and menstrual cycle: Secondary | ICD-10-CM

## 2014-05-17 DIAGNOSIS — Z9884 Bariatric surgery status: Secondary | ICD-10-CM | POA: Insufficient documentation

## 2014-05-17 DIAGNOSIS — N949 Unspecified condition associated with female genital organs and menstrual cycle: Secondary | ICD-10-CM | POA: Insufficient documentation

## 2014-05-17 DIAGNOSIS — N854 Malposition of uterus: Secondary | ICD-10-CM | POA: Insufficient documentation

## 2014-05-17 DIAGNOSIS — R011 Cardiac murmur, unspecified: Secondary | ICD-10-CM | POA: Insufficient documentation

## 2014-05-17 DIAGNOSIS — D3910 Neoplasm of uncertain behavior of unspecified ovary: Secondary | ICD-10-CM

## 2014-05-17 DIAGNOSIS — N7013 Chronic salpingitis and oophoritis: Secondary | ICD-10-CM | POA: Insufficient documentation

## 2014-05-17 HISTORY — PX: LAPAROSCOPIC OVARIAN CYSTECTOMY: SHX6248

## 2014-05-17 LAB — PREGNANCY, URINE: Preg Test, Ur: NEGATIVE

## 2014-05-17 LAB — FERRITIN: FERRITIN: 13 ng/mL (ref 10–291)

## 2014-05-17 SURGERY — EXCISION, CYST, OVARY, LAPAROSCOPIC
Anesthesia: General | Site: Abdomen | Laterality: Right

## 2014-05-17 MED ORDER — LIDOCAINE HCL (CARDIAC) 20 MG/ML IV SOLN
INTRAVENOUS | Status: DC | PRN
  Administered 2014-05-17: 70 mg via INTRAVENOUS
  Administered 2014-05-17: 30 mg via INTRAVENOUS

## 2014-05-17 MED ORDER — CEFAZOLIN SODIUM-DEXTROSE 2-3 GM-% IV SOLR: 2.0000 g | INTRAVENOUS | Status: DC

## 2014-05-17 MED ORDER — CEFAZOLIN SODIUM-DEXTROSE 2-3 GM-% IV SOLR
INTRAVENOUS | Status: AC
Start: 2014-05-17 — End: 2014-05-17
  Administered 2014-05-17: 2 g via INTRAVENOUS
  Filled 2014-05-17: qty 50

## 2014-05-17 MED ORDER — METOCLOPRAMIDE HCL 5 MG/ML IJ SOLN: 10.0000 mg | Freq: Once | INTRAMUSCULAR | Status: DC | PRN

## 2014-05-17 MED ORDER — OXYCODONE HCL 5 MG/5ML PO SOLN: 5.0000 mg | Freq: Once | ORAL | Status: DC | PRN

## 2014-05-17 MED ORDER — LACTATED RINGERS IV SOLN
INTRAVENOUS | Status: DC
  Administered 2014-05-17 (×2): via INTRAVENOUS

## 2014-05-17 MED ORDER — NEOSTIGMINE METHYLSULFATE 10 MG/10ML IV SOLN
INTRAVENOUS | Status: DC | PRN
  Administered 2014-05-17: 3 mg via INTRAVENOUS

## 2014-05-17 MED ORDER — OXYCODONE HCL 5 MG PO TABS: 5.0000 mg | ORAL_TABLET | Freq: Once | ORAL | Status: DC | PRN

## 2014-05-17 MED ORDER — BUPIVACAINE HCL (PF) 0.25 % IJ SOLN
INTRAMUSCULAR | Status: DC | PRN
  Administered 2014-05-17: 10 mL

## 2014-05-17 MED ORDER — MIDAZOLAM HCL 2 MG/2ML IJ SOLN
INTRAMUSCULAR | Status: AC
  Filled 2014-05-17: qty 2

## 2014-05-17 MED ORDER — GLYCOPYRROLATE 0.2 MG/ML IJ SOLN
INTRAMUSCULAR | Status: AC
  Filled 2014-05-17: qty 1

## 2014-05-17 MED ORDER — MIDAZOLAM HCL 2 MG/2ML IJ SOLN
INTRAMUSCULAR | Status: DC | PRN
  Administered 2014-05-17: 2 mg via INTRAVENOUS

## 2014-05-17 MED ORDER — MEPERIDINE HCL 25 MG/ML IJ SOLN: 6.2500 mg | INTRAMUSCULAR | Status: DC | PRN

## 2014-05-17 MED ORDER — FENTANYL CITRATE 0.05 MG/ML IJ SOLN: 25.0000 ug | INTRAMUSCULAR | Status: DC | PRN

## 2014-05-17 MED ORDER — FENTANYL CITRATE 0.05 MG/ML IJ SOLN
INTRAMUSCULAR | Status: DC | PRN
  Administered 2014-05-17 (×5): 50 ug via INTRAVENOUS

## 2014-05-17 MED ORDER — LIDOCAINE HCL (CARDIAC) 20 MG/ML IV SOLN
INTRAVENOUS | Status: AC
  Filled 2014-05-17: qty 5

## 2014-05-17 MED ORDER — FENTANYL CITRATE 0.05 MG/ML IJ SOLN
INTRAMUSCULAR | Status: AC
  Filled 2014-05-17: qty 5

## 2014-05-17 MED ORDER — KETOROLAC TROMETHAMINE 30 MG/ML IJ SOLN
INTRAMUSCULAR | Status: AC
Start: 2014-05-17 — End: 2014-05-17
  Filled 2014-05-17: qty 1

## 2014-05-17 MED ORDER — DEXAMETHASONE SODIUM PHOSPHATE 10 MG/ML IJ SOLN
INTRAMUSCULAR | Status: AC
  Filled 2014-05-17: qty 1

## 2014-05-17 MED ORDER — ROCURONIUM BROMIDE 100 MG/10ML IV SOLN
INTRAVENOUS | Status: DC | PRN
  Administered 2014-05-17: 35 mg via INTRAVENOUS
  Administered 2014-05-17: 10 mg via INTRAVENOUS

## 2014-05-17 MED ORDER — NEOSTIGMINE METHYLSULFATE 10 MG/10ML IV SOLN
INTRAVENOUS | Status: AC
  Filled 2014-05-17: qty 1

## 2014-05-17 MED ORDER — KETOROLAC TROMETHAMINE 30 MG/ML IJ SOLN
INTRAMUSCULAR | Status: DC | PRN
  Administered 2014-05-17: 30 mg via INTRAVENOUS

## 2014-05-17 MED ORDER — SODIUM CHLORIDE 0.9 % IJ SOLN
INTRAMUSCULAR | Status: AC
  Filled 2014-05-17: qty 10

## 2014-05-17 MED ORDER — GLYCOPYRROLATE 0.2 MG/ML IJ SOLN
INTRAMUSCULAR | Status: AC
  Filled 2014-05-17: qty 3

## 2014-05-17 MED ORDER — HEPARIN SODIUM (PORCINE) 5000 UNIT/ML IJ SOLN
INTRAMUSCULAR | Status: AC
  Filled 2014-05-17: qty 1

## 2014-05-17 MED ORDER — PROPOFOL 10 MG/ML IV BOLUS
INTRAVENOUS | Status: DC | PRN
  Administered 2014-05-17: 180 mg via INTRAVENOUS

## 2014-05-17 MED ORDER — ROCURONIUM BROMIDE 100 MG/10ML IV SOLN
INTRAVENOUS | Status: AC
  Filled 2014-05-17: qty 1

## 2014-05-17 MED ORDER — ONDANSETRON HCL 4 MG/2ML IJ SOLN
INTRAMUSCULAR | Status: AC
  Filled 2014-05-17: qty 2

## 2014-05-17 MED ORDER — GLYCOPYRROLATE 0.2 MG/ML IJ SOLN
INTRAMUSCULAR | Status: DC | PRN
  Administered 2014-05-17: 0.2 mg via INTRAVENOUS
  Administered 2014-05-17: 0.6 mg via INTRAVENOUS

## 2014-05-17 MED ORDER — PROPOFOL 10 MG/ML IV EMUL
INTRAVENOUS | Status: AC
  Filled 2014-05-17: qty 20

## 2014-05-17 MED ORDER — ONDANSETRON HCL 4 MG/2ML IJ SOLN
INTRAMUSCULAR | Status: DC | PRN
  Administered 2014-05-17: 4 mg via INTRAVENOUS

## 2014-05-17 MED ORDER — BUPIVACAINE HCL (PF) 0.25 % IJ SOLN
INTRAMUSCULAR | Status: AC
  Filled 2014-05-17: qty 30

## 2014-05-17 MED ORDER — DEXAMETHASONE SODIUM PHOSPHATE 10 MG/ML IJ SOLN
INTRAMUSCULAR | Status: DC | PRN
  Administered 2014-05-17: 10 mg via INTRAVENOUS

## 2014-05-17 SURGICAL SUPPLY — 35 items
BENZOIN TINCTURE PRP APPL 2/3 (GAUZE/BANDAGES/DRESSINGS) IMPLANT
CABLE HIGH FREQUENCY MONO STRZ (ELECTRODE) ×3 IMPLANT
CATH ROBINSON RED A/P 16FR (CATHETERS) IMPLANT
CHLORAPREP W/TINT 26ML (MISCELLANEOUS) ×3 IMPLANT
CLOSURE WOUND 1/2 X4 (GAUZE/BANDAGES/DRESSINGS)
CLOSURE WOUND 1/4X4 (GAUZE/BANDAGES/DRESSINGS) ×1
CLOTH BEACON ORANGE TIMEOUT ST (SAFETY) ×3 IMPLANT
DERMABOND ADVANCED (GAUZE/BANDAGES/DRESSINGS) ×2
DERMABOND ADVANCED .7 DNX12 (GAUZE/BANDAGES/DRESSINGS) ×1 IMPLANT
DRSG COVADERM PLUS 2X2 (GAUZE/BANDAGES/DRESSINGS) ×3 IMPLANT
DRSG OPSITE POSTOP 3X4 (GAUZE/BANDAGES/DRESSINGS) ×3 IMPLANT
GLOVE BIOGEL PI IND STRL 7.0 (GLOVE) ×2 IMPLANT
GLOVE BIOGEL PI INDICATOR 7.0 (GLOVE) ×4
GLOVE ECLIPSE 6.5 STRL STRAW (GLOVE) ×6 IMPLANT
GOWN STRL REUS W/TWL LRG LVL3 (GOWN DISPOSABLE) ×3 IMPLANT
NEEDLE INSUFFLATION 120MM (ENDOMECHANICALS) ×3 IMPLANT
PACK LAPAROSCOPY BASIN (CUSTOM PROCEDURE TRAY) ×3 IMPLANT
POUCH SPECIMEN RETRIEVAL 10MM (ENDOMECHANICALS) IMPLANT
PROTECTOR NERVE ULNAR (MISCELLANEOUS) ×3 IMPLANT
SEALER TISSUE G2 CVD JAW 35 (ENDOMECHANICALS) IMPLANT
SEALER TISSUE G2 CVD JAW 45CM (ENDOMECHANICALS)
SET CYSTO W/LG BORE CLAMP LF (SET/KITS/TRAYS/PACK) ×3 IMPLANT
SET IRRIG TUBING LAPAROSCOPIC (IRRIGATION / IRRIGATOR) IMPLANT
STRIP CLOSURE SKIN 1/2X4 (GAUZE/BANDAGES/DRESSINGS) IMPLANT
STRIP CLOSURE SKIN 1/4X4 (GAUZE/BANDAGES/DRESSINGS) ×2 IMPLANT
SUT VICRYL 0 UR6 27IN ABS (SUTURE) ×6 IMPLANT
SUT VICRYL 4-0 PS2 18IN ABS (SUTURE) ×3 IMPLANT
SYR 30ML LL (SYRINGE) IMPLANT
TOWEL OR 17X24 6PK STRL BLUE (TOWEL DISPOSABLE) ×6 IMPLANT
TRAY FOLEY CATH 14FR (SET/KITS/TRAYS/PACK) ×3 IMPLANT
TROCAR BALLN 12MMX100 BLUNT (TROCAR) IMPLANT
TROCAR XCEL NON-BLD 11X100MML (ENDOMECHANICALS) IMPLANT
TROCAR XCEL NON-BLD 5MMX100MML (ENDOMECHANICALS) IMPLANT
WARMER LAPAROSCOPE (MISCELLANEOUS) ×3 IMPLANT
WATER STERILE IRR 1000ML POUR (IV SOLUTION) ×3 IMPLANT

## 2014-05-17 NOTE — Discharge Instructions (Addendum)
Post-surgical Instructions, Outpatient Surgery  You may expect to feel dizzy, weak, and drowsy for as long as 24 hours after receiving the medicine that made you sleep (anesthetic). For the first 24 hours after your surgery:    Do not drive a car, ride a bicycle, participate in physical activities, or take public transportation until you are done taking narcotic pain medicines or as directed by Dr. [name] Heck.   Do not drink alcohol or take tranquilizers.   Do not take medicine that has not been prescribed by your physicians.   Do not sign important papers or make important decisions while on narcotic pain medicines.   Have a responsible person with you.   CARE OF INCISION  If you have a bandage, you may remove it in one day, except for the one at the belly button.  This one you can remove in 3 days.  If there are steri-strips or dermabond, just let this loosen on its own.   You may shower on the first day after your surgery.  Do not sit in a tub bath for one week.  Avoid heavy lifting (more than 10 pounds/4.5 kilograms), pushing, or pulling.   Avoid activities that may risk injury to your incisions.   PAIN MANAGEMENT    MSIR 15mg , one tablet every 4 hours as needed.  (Remember that narcotic pain medications increase your risk of constipation.  If this becomes a problem, you may take an over the counter stool softener like Colace 100mg  up to four times a day.)  DO'S AND DON'T'S  Do not take a tub bath for one week.  You may shower on the first day after your surgery  Do not do any heavy lifting for one to two weeks.  This increases the chance of bleeding.  Do move around as you feel able.  Stairs are fine.  You may begin to exercise again as you feel able.  Do not lift any weights for two weeks.  Do not put anything in the vagina for two weeks--no tampons, intercourse, or douching.    REGULAR MEDIATIONS/VITAMINS:  You may restart all of your regular medications as  prescribed.  You may restart all of your vitamins as you normally take them.    PLEASE CALL OR SEEK MEDICAL CARE IF:  You have persistent nausea and vomiting.   You have trouble eating or drinking.   You have an oral temperature above 100.5.   You have constipation that is not helped by adjusting diet or increasing fluid intake. Pain medicines are a common cause of constipation.   You have heavy vaginal bleeding  You have redness or drainage from your incision(s) or there is increasing pain or tenderness near or in the surgical site.

## 2014-05-17 NOTE — Op Note (Signed)
05/17/2014  1:23 PM  PATIENT:  Sheri [name]  38 y.o. 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, scarring of right ovary to the bladder, malpositioned uterus  PROCEDURE:  Procedure(s): LAPAROSCOPIC  partial  right salpingo-oohorectomy  and cystoscopy  SURGEON:  Omarii Scalzo SUZANNE  ASSISTANTS: Josefa Half   ANESTHESIA:   general  ESTIMATED BLOOD LOSS: 10cc  BLOOD ADMINISTERED:none   FLUIDS: 1200cc LR  UOP: 75cc concentrated urine in foley bag  SPECIMEN:  Partial right tube and small portion of right ovary, washings  DISPOSITION OF SPECIMEN:  PATHOLOGY  FINDINGS: fimbria of right fallopian tube scarred to bladder, dilated right tube, scarring of right ovary to bladder, more exaggerated posterior position of uterus which was relieved after scarring was incised, normal ovaries, normal left tube, normal liver, small amount of adhesions along the left upper quadrant  DESCRIPTION OF OPERATION: Patient is taken to the operating room. She is placed in the supine position. She is a running IV in place. Informed consent was present on the chart. SCDs on her lower extremities and functioning properly. General endotracheal anesthesia was administered by the anesthesia staff without difficulty. Once adequate anesthesia was confirmed the legs are placed in the low lithotomy position in [place]. Her left arm was tucked by the side.   The right arm extended on the arm board.    Dura prep was then used to prep the abdomen and Betadine was used to prep the inner thighs, perineum and vagina. Once 3 minutes had past the patient was draped in a normal standard fashion. The legs were lifted to the high lithotomy position. The cervix was visualized by placing a heavy weighted speculum in the posterior aspect of the vagina and using a curved Deaver retractor to the retract anteriorly. The uterus sounded to  7 cm.  The anterior lip of the cervix was grasped with single-tooth tenaculum. A Hulka clamp was advanced through the cervical canal and attached to the anterior lip of the cervix as a means of manipulating the uterus during the procedure. The tenaculum was removed. The speculum was removed. A Foley catheter was placed to straight drain. Clear urine was noted. Legs were lowered to the low lithotomy position and attention was turned the abdomen.   0.25% Marcaine was used anesthetize the skin above the umbilicus. An open technique was used to make the incision for the [place] port.  There was significant scaring from her prior abdominoplasty.  Once the fascia was incised, the peritoneum was found easily and incised sharply.  A single #0 Vicryl suture was used on the corners of the incision to provide and anchor for the [place] port.  Pneumoperitoneum was achieved.  Locations for LLQ and RLQ 20mm ports were chosen with transillumination of the skin. The skin was anesthetized with 0.25% Marcaine. 77mm skin incisions were made and 5mm nonbladed trocars and ports were passed directly into the abdomen. The trochars were removed. The upper abdomen was surveyed. Liver, gall bladder, and stomach edge appeared normal except for the adhesions from prior surgery.  The patient was placed in Trendelenburg. The pelvix was surveyed. The ovaries were normal.  The left fallopian tube was normal.  The right tube was scarred along with the ovary to the bladder.  The tube was dilated as well.  Washings were obtained.  Using sharp dissection, the scar was incised.  A small portion of the ovary was excised  to be able to visualize the tube better.  This was ultimately excised off the bladder and sent for pathology.   The tube on the right was dilated and the fimbriated end scarred closed.  Decision was made to remove a portion of the tube.  Using an Enseal device, the tube was transected in the mid-cornual region and the mesosalpinx serially  clamped, cauterized and incised.  This freed the tube.  Once the adhesions were completely freed, the uterus become more mid position in location and was much more mobile from vaginal manipulation.  The tube was then placed in a laparoscopic bag and brought through the midline.  The tube was ruptured in the bag, allowing for enough decompression to the remove this through the umbilical incision.  The pelvis was irrigated.  No bleeding was noted.  The inferior ports were removed under direct visualization.  The pneumoperitoneum was relieved.  The pt was taken out of Trendelenburg positioning.  The umbilical incision was closed with a running, interlocking stitch of #0 Vicryl.  Then the skin was closed at all three port sites with subcuticular stitch of #4.0 Vicryl.  Dermabond was applied.    The vaginal instruments were removed.  The Foley catheter was removed.  A cystoscopy was performed to ensure there was no bladder injury.  The bladder was intact and the ureteral orifices were seen and were both shooting urine.  Cystoscopy fluid was drained and the procedure was ended.  Sponge, lap, instrument, and needle counts were correct  X 2.  Pt was placed back in supine positioning and awakened from anesthesia without difficulty.  Pt was taken to PACU in stable condition.   COUNTS:  YES  PLAN OF CARE: Transfer to PACU

## 2014-05-17 NOTE — Transfer of Care (Signed)
Immediate Anesthesia Transfer of Care Note  Patient: Sheri [name]  Procedure(s) Performed: Procedure(s): LAPAROSCOPIC  partial  right salpingo-oohorectomy  and cystoscopy (Right)  Patient Location: PACU  Anesthesia Type:General  Level of Consciousness: awake, alert , oriented and patient cooperative  Airway & Oxygen Therapy: Patient Spontanous Breathing and Patient connected to nasal cannula oxygen  Post-op Assessment: Report given to PACU RN and Post -op Vital signs reviewed and stable  Post vital signs: Reviewed and stable  Complications: No apparent anesthesia complications

## 2014-05-17 NOTE — Anesthesia Postprocedure Evaluation (Signed)
Anesthesia Post Note  Patient: Sheri [name]  Procedure(s) Performed: Procedure(s) (LRB): LAPAROSCOPIC  partial  right salpingo-oohorectomy  and cystoscopy (Right)  Anesthesia type: General  Patient location: PACU  Post pain: Pain level controlled  Post assessment: Post-op Vital signs reviewed  Last Vitals:  Filed Vitals:   05/17/14 1415  BP: 106/71  Pulse: 56  Temp:   Resp: 14    Post vital signs: Reviewed  Level of consciousness: sedated  Complications: No apparent anesthesia complications

## 2014-05-17 NOTE — Anesthesia Preprocedure Evaluation (Addendum)
Anesthesia Evaluation  Patient identified by MRN, date of birth, ID band Patient awake    Reviewed: Allergy & Precautions, H&P , NPO status , Patient's Chart, lab work & pertinent test results  Airway Mallampati: II TM Distance: >3 FB Neck ROM: Full    Dental no notable dental hx. (+) Teeth Intact   Pulmonary neg pulmonary ROS,  breath sounds clear to auscultation  Pulmonary exam normal       Cardiovascular negative cardio ROS  + Valvular Problems/Murmurs Rhythm:Regular Rate:Normal     Neuro/Psych negative neurological ROS  negative psych ROS   GI/Hepatic Neg liver ROS, GERD-  ,S/P Roux en Y Gastric Bypass 2001   Endo/Other  Hypothyroidism Morbid obesityRight Ovarian Cyst  Renal/GU negative Renal ROS  negative genitourinary   Musculoskeletal   Abdominal (+) + obese,   Peds  Hematology  (+) anemia , Hx/o Blood transfusion 2004   Anesthesia Other Findings   Reproductive/Obstetrics Chronic pelvic pain                          Anesthesia Physical Anesthesia Plan  ASA: III  Anesthesia Plan: General   Post-op Pain Management:    Induction: Intravenous  Airway Management Planned: Oral ETT  Additional Equipment:   Intra-op Plan:   Post-operative Plan: Extubation in OR  Informed Consent: I have reviewed the patients History and Physical, chart, labs and discussed the procedure including the risks, benefits and alternatives for the proposed anesthesia with the patient or authorized representative who has indicated his/her understanding and acceptance.   Dental advisory given  Plan Discussed with: CRNA, Anesthesiologist and Surgeon  Anesthesia Plan Comments:         Anesthesia Quick Evaluation

## 2014-05-17 NOTE — H&P (Signed)
Sheri [name] is an 38 y.o. female  G45 SAAF here with laparoscopic right ovarian cyst removal.  Cyst has been present on ultrasound for greater than six months showing smooth walled, avascular cyst.  Pt has been experiencing RLQ pain for this entire time.  She feels these are related.  I have discussed with her that I am not sure about this nor can I guarantee that surgery will fix any pain she may have.  Procedure, risks, and benefits have all been discussed and she is here and ready to proceed.  I am also planning to evaluate for endometriosis and will treat this if I see it as well.  Above reviewed with patient. She is ready to proceed.  Pertinent Gynecological History: Menses: none since starting current OCP Bleeding: none Contraception: OCP (estrogen/progesterone) DES exposure: denies Blood transfusions: one after abdominoplasty Sexually transmitted diseases: no past history Previous GYN Procedures: none except ultrasounds  Last mammogram: n/a Date: n/a Last pap: normal Date: 5/15 OB History: G0, P0   Menstrual History: No LMP recorded. Patient is not currently having periods (Reason: Oral contraceptives).    Past Medical History  Diagnosis Date  . Anemia     and low B12  . History of blood transfusion 2004    In [place] at [place]  . Seasonal allergies   . Heart murmur     as child - no problems as adult  . Hypothyroidism     history in past, resolved    Past Surgical History  Procedure Laterality Date  . Gastric bypass  2001    Dr Duwayne Heck, in [facility]  . Abdominoplasty  2004  . Breast reduction surgery  2005  . Wisdom tooth extraction  2014    upper right    Family History  Problem Relation Age of Onset  . CVA Maternal Grandmother   . Hypertension Brother   . Diabetes Brother   . Heart disease Brother   . Diabetes Sister   . Hypertension Sister   . Diabetes Sister   . Hypertension Sister   . Diabetes Brother   . Hypertension Brother   . Heart  disease Brother   . Diabetes Brother   . Hypertension Brother   . Heart disease Brother     Social History:  reports that she has never smoked. She has never used smokeless tobacco. She reports that she does not drink alcohol or use illicit drugs.  Allergies:  Allergies  Allergen Reactions  . Aspirin Other (See Comments)    Pt. Has had gastric bypass surgery.  ASA causes ulcers.  Can take Aleve.    Prescriptions prior to admission  Medication Sig Dispense Refill  . ferrous sulfate 220 (44 FE) MG/5ML solution Take 5 mLs (220 mg total) by mouth daily.  480 mL  1  . montelukast (SINGULAIR) 10 MG tablet Take 10 mg by mouth at bedtime.      . Norethin Ace-Eth Estrad-FE (MINASTRIN 24 FE) 1-20 MG-MCG(24) CHEW Chew 1 tablet by mouth daily.  28 tablet  1  . morphine (MSIR) 15 MG tablet Take 1 tablet (15 mg total) by mouth every 4 (four) hours as needed for severe pain.  20 tablet  0    Review of Systems  All other systems reviewed and are negative.   Blood pressure 121/68, pulse 59, temperature 98.2 F (36.8 C), temperature source Oral, resp. rate 16, SpO2 100.00%. Physical Exam  Results for orders placed during the hospital encounter of 05/17/14 (  from the past 24 hour(s))  PREGNANCY, URINE     Status: None   Collection Time    05/17/14  8:50 AM      Result Value Ref Range   Preg Test, Ur NEGATIVE  NEGATIVE    No results found.  Assessment/Plan: 38 yo G0 SAAF here for laparoscopic right ovarian removal due to persistent 4cm right ovarian cyst and RLQ pain.  All questions answered and pt ready to proceed.  Hale Bogus SUZANNE 05/17/2014, 10:44 AM

## 2014-05-18 ENCOUNTER — Encounter (HOSPITAL_COMMUNITY): Payer: Self-pay | Admitting: Obstetrics & Gynecology

## 2014-05-18 MED FILL — Heparin Sodium (Porcine) Inj 5000 Unit/ML: INTRAMUSCULAR | Qty: 1 | Status: AC

## 2014-05-26 ENCOUNTER — Telehealth: Payer: Self-pay | Admitting: Obstetrics & Gynecology

## 2014-05-26 NOTE — Telephone Encounter (Signed)
Patient is requesting a note to be excusing her from work this week. Patient went back to work on Monday and was just to exhausted to stay. Patient has been working from home this week. Patient has a post op appointment this Friday.

## 2014-05-26 NOTE — Telephone Encounter (Signed)
Routing to Dr. [name] Heck for review and advise. Patient attempted to drive first time on Monday into work and realized that she is more mentally and physically tired than she thought she would be. She has been working from home and is just fatigued. No fevers or abdominal pain other than slight cramping intermittently. She is requesting that on Friday, when she comes for post op appointment, that she be given a letter that says it was okay to have modified work schedule from home from 7/13-7/19/15 and cleared to return to work with no restrictions on 05/31/14. Wanting to know if Dr. [name] Heck agrees to this?

## 2014-05-26 NOTE — Telephone Encounter (Signed)
Yes.  That is fine.  I can sign it once done.

## 2014-05-27 ENCOUNTER — Encounter: Payer: Self-pay | Admitting: Emergency Medicine

## 2014-05-27 NOTE — Telephone Encounter (Signed)
Letter written, printed and given to [facility] to place on patient's chart for tomorrow.

## 2014-05-28 ENCOUNTER — Encounter: Payer: Self-pay | Admitting: Obstetrics & Gynecology

## 2014-05-28 ENCOUNTER — Ambulatory Visit (INDEPENDENT_AMBULATORY_CARE_PROVIDER_SITE_OTHER): Payer: Managed Care, Other (non HMO) | Admitting: Obstetrics & Gynecology

## 2014-05-28 VITALS — BP 118/80 | HR 60 | Ht 66.0 in | Wt 251.0 lb

## 2014-05-28 DIAGNOSIS — Z9889 Other specified postprocedural states: Secondary | ICD-10-CM

## 2014-05-28 DIAGNOSIS — N9489 Other specified conditions associated with female genital organs and menstrual cycle: Secondary | ICD-10-CM | POA: Insufficient documentation

## 2014-05-28 NOTE — Progress Notes (Signed)
Post Operative Visit  Procedure:LAPAROSCOPIC partial right salpingo-oohorectomy and cystoscopy  Days Post-op: 11  Subjective: Doing well.  Incisions healing well.  Pain is really good.  Hasn't taken anything in several day.  Had a scant amount of spotting for about a week post operatively.  Bowel function does seem to have returned to normal.  Emptying bladder fine.  Reviewed pictures and pathology with patient.    Objective: BP 118/80  Pulse 60  Ht 5\' 6"  (1.676 m)  Wt 251 lb (113.853 kg)  BMI 40.53 kg/m2  EXAM General: alert and cooperative Resp: clear to auscultation bilaterally Cardio: regular rate and rhythm, S1, S2 normal, no murmur, click, rub or gallop GI: soft, non-tender; bowel sounds normal; no masses,  no organomegaly Extremities: extremities normal, atraumatic, no cyanosis or edema Vaginal Bleeding: none Inc:  C/D/I  Assessment: s/p laparoscopy with partial RSO, cystoscopy  Plan: Return to work Monday.  Letter given to patient. Recheck 2 months

## 2014-06-02 ENCOUNTER — Other Ambulatory Visit: Payer: Managed Care, Other (non HMO)

## 2014-06-10 ENCOUNTER — Telehealth: Payer: Self-pay | Admitting: Obstetrics & Gynecology

## 2014-06-10 NOTE — Telephone Encounter (Signed)
Patient calling to see if her FMLA forms are ready yet?

## 2014-06-11 DIAGNOSIS — Z0289 Encounter for other administrative examinations: Secondary | ICD-10-CM

## 2014-06-29 NOTE — Telephone Encounter (Signed)
Sheri [name], Can this encounter be closed? [name]

## 2014-07-14 ENCOUNTER — Telehealth: Payer: Self-pay | Admitting: Obstetrics & Gynecology

## 2014-07-14 NOTE — Telephone Encounter (Signed)
Pt calling to check status of claims says she had surgery earlier this year and wants to see if she is going to owe any more or if she will receive a refund

## 2014-07-29 ENCOUNTER — Encounter: Payer: Self-pay | Admitting: Obstetrics & Gynecology

## 2014-07-29 ENCOUNTER — Ambulatory Visit (INDEPENDENT_AMBULATORY_CARE_PROVIDER_SITE_OTHER): Payer: Managed Care, Other (non HMO) | Admitting: Obstetrics & Gynecology

## 2014-07-29 VITALS — BP 100/62 | HR 56 | Resp 16 | Wt 255.2 lb

## 2014-07-29 DIAGNOSIS — N9489 Other specified conditions associated with female genital organs and menstrual cycle: Secondary | ICD-10-CM

## 2014-07-29 MED ORDER — NORETHIN ACE-ETH ESTRAD-FE 1-20 MG-MCG(24) PO CHEW: 1.0000 | CHEWABLE_TABLET | Freq: Every day | ORAL | Status: DC

## 2014-07-29 NOTE — Progress Notes (Deleted)
Post Operative Visit  Procedure:Laparoscopic partial left salpingo-oohorectomy and cystoscopy Days Post-op: 74  Subjective: ***  Objective: BP 100/62  Pulse 56  Resp 16  Wt 255 lb 3.2 oz (115.758 kg)  LMP 01/10/2013  EXAM General: {Exam; general:16600} Resp: {Exam; lung:16931} Cardio: {Exam; heart:5510} GI: {Exam, BD:5329924} Extremities: {Exam; extremity:5109} Vaginal Bleeding: {exam; vaginal bleeding:3041122}  Assessment: s/p ***  Plan: Recheck {NUMBER 1-10:22536} weeks ***

## 2014-07-29 NOTE — Progress Notes (Signed)
Subjective:     Patient ID: Sheri [name], female   DOB: [birth year], 38 y.o.   MRN: [id number]  HPI 38 yo G2P0 SAAF here for recheck after laparoscopic for right ovarian cyst that turned out to be a cyst on the fallopian tube.  Also, unexpected finding of adhesion to this ovary was seen.  Pathology showed cystadenoma.  Pt states chronic, pulling pain has completely resolved.  States she feels great.  Has started exercising again.  Amenorrhea on OCPs.  No issues.  BP normal today.  No increased headaches.  Does occasionally feel like her "ovary hurts".  O/W doing well.    Review of Systems  All other systems reviewed and are negative.      Objective:   Physical Exam  Constitutional: She is oriented to person, place, and time. She appears well-developed and well-nourished.  Abdominal: Soft. Bowel sounds are normal. She exhibits no distension and no mass. There is no tenderness. There is no rebound and no guarding.  Neurological: She is alert and oriented to person, place, and time.  Skin: Skin is warm and dry.  Psychiatric: She has a normal mood and affect.       Assessment:     Pelvic pain with significant improvement since surgery H/O persistent right cyst on u/s. Surgical excision 05/17/14    Plan:   Continue current medications.  Rx for [employer] given for one year. AEX scheduled 6/16

## 2014-08-06 ENCOUNTER — Telehealth: Payer: Self-pay | Admitting: Emergency Medicine

## 2014-08-06 MED ORDER — NORETHIN ACE-ETH ESTRAD-FE 1-20 MG-MCG(24) PO CHEW: 1.0000 | CHEWABLE_TABLET | Freq: Every day | ORAL | Status: DC

## 2014-08-06 NOTE — Telephone Encounter (Signed)
[facility] and they have received current escribed rx.  Routing to provider for final review. Patient agreeable to disposition. Will close encounter

## 2014-08-06 NOTE — Telephone Encounter (Signed)
Patient calling, she is at [facility] and they do not have her prescription. Prescription re-ordered and sent to pharmacy.  Patient aware.

## 2014-08-09 ENCOUNTER — Other Ambulatory Visit: Payer: Self-pay

## 2014-08-09 NOTE — Telephone Encounter (Signed)
Per Karen Chafe, Rn (encounter 08/06/14) Minastrin Rx was received at the pharmacy.  Encounter closed

## 2014-08-26 ENCOUNTER — Other Ambulatory Visit: Payer: Self-pay | Admitting: Obstetrics & Gynecology

## 2014-08-26 DIAGNOSIS — Z1231 Encounter for screening mammogram for malignant neoplasm of breast: Secondary | ICD-10-CM

## 2014-08-30 ENCOUNTER — Ambulatory Visit (HOSPITAL_COMMUNITY)
Admission: RE | Admit: 2014-08-30 | Discharge: 2014-08-30 | Disposition: A | Discharge status: Home / Self Care | Payer: Managed Care, Other (non HMO) | Source: Ambulatory Visit | Attending: Obstetrics & Gynecology | Admitting: Obstetrics & Gynecology

## 2014-08-30 DIAGNOSIS — Z1231 Encounter for screening mammogram for malignant neoplasm of breast: Secondary | ICD-10-CM | POA: Insufficient documentation

## 2014-09-07 ENCOUNTER — Other Ambulatory Visit: Payer: Self-pay | Admitting: Obstetrics & Gynecology

## 2014-09-07 DIAGNOSIS — R928 Other abnormal and inconclusive findings on diagnostic imaging of breast: Secondary | ICD-10-CM

## 2014-09-13 ENCOUNTER — Encounter: Payer: Self-pay | Admitting: Obstetrics & Gynecology

## 2014-09-20 ENCOUNTER — Ambulatory Visit
Admission: RE | Admit: 2014-09-20 | Discharge: 2014-09-20 | Disposition: A | Discharge status: Home / Self Care | Payer: Managed Care, Other (non HMO) | Source: Ambulatory Visit | Attending: Obstetrics & Gynecology | Admitting: Obstetrics & Gynecology

## 2014-09-20 DIAGNOSIS — R928 Other abnormal and inconclusive findings on diagnostic imaging of breast: Secondary | ICD-10-CM

## 2014-11-29 ENCOUNTER — Telehealth: Payer: Self-pay | Admitting: Obstetrics & Gynecology

## 2014-11-29 NOTE — Telephone Encounter (Signed)
Call to patient. She states that Minastrin is costing her $144.43 and it usually doesn't cost so much. She is concerned.  Advised would call pharmacy to discuss. Called pharmacy. Patient has new deductible. Savings card information given.  New rx cost $53.28.  Patient notified, she will pick up rx.  Routing to provider for final review. Patient agreeable to disposition. Will close encounter

## 2014-11-29 NOTE — Telephone Encounter (Signed)
Patient calling requesting some assistance with her Minastrin due to cost and insurance issues. Pharmacy on file is correct.

## 2015-04-22 ENCOUNTER — Ambulatory Visit: Payer: Managed Care, Other (non HMO) | Admitting: Obstetrics & Gynecology

## 2015-04-28 ENCOUNTER — Telehealth: Payer: Self-pay | Admitting: Obstetrics & Gynecology

## 2015-04-28 NOTE — Telephone Encounter (Signed)
Patient would like an appointment with Dr [name] Heck just for a check after surgery.

## 2015-04-28 NOTE — Telephone Encounter (Signed)
Can you find a place for her for an AEX.  I would prefer to do this instead of a "check in" appt.  Thanks.

## 2015-04-28 NOTE — Telephone Encounter (Signed)
Left message to call Kaitlyn at [phone]. 

## 2015-04-28 NOTE — Telephone Encounter (Signed)
Spoke with patient. Patient states that she had to cancel her aex with Dr.Miller due to being [place] the [place]. Patient is rescheduled by front for first available aex for 06/15/2016. Would like to be seen before aex to "just check in with Dr.Miller." Denies any current problems. OV scheduled with Dr.Miller for 05/27/2015 at 2:30pm. Patient is agreeable to date and time.  Routing to provider for final review. Patient agreeable to disposition. Will close encounter.   Patient aware provider will review message and nurse will return call if any additional advice or change of disposition.

## 2015-05-05 NOTE — Telephone Encounter (Signed)
6/23 lmtcb//kn

## 2015-05-05 NOTE — Telephone Encounter (Signed)
Appointment for AEX made on 06/03/15 with Dr Miller.//kn

## 2015-05-27 ENCOUNTER — Ambulatory Visit: Payer: Managed Care, Other (non HMO) | Admitting: Obstetrics & Gynecology

## 2015-06-03 ENCOUNTER — Ambulatory Visit (INDEPENDENT_AMBULATORY_CARE_PROVIDER_SITE_OTHER): Payer: Managed Care, Other (non HMO) | Admitting: Obstetrics & Gynecology

## 2015-06-03 ENCOUNTER — Encounter: Payer: Self-pay | Admitting: Obstetrics & Gynecology

## 2015-06-03 VITALS — BP 118/74 | HR 60 | Resp 16 | Ht 66.0 in | Wt 257.0 lb

## 2015-06-03 DIAGNOSIS — Z01419 Encounter for gynecological examination (general) (routine) without abnormal findings: Secondary | ICD-10-CM

## 2015-06-03 DIAGNOSIS — Z9884 Bariatric surgery status: Secondary | ICD-10-CM

## 2015-06-03 DIAGNOSIS — R5383 Other fatigue: Secondary | ICD-10-CM

## 2015-06-03 DIAGNOSIS — Z0000 Encounter for general adult medical examination without abnormal findings: Secondary | ICD-10-CM

## 2015-06-03 LAB — POCT URINALYSIS DIPSTICK
Bilirubin, UA: NEGATIVE
Blood, UA: NEGATIVE
GLUCOSE UA: NEGATIVE
Ketones, UA: NEGATIVE
Leukocytes, UA: NEGATIVE
Nitrite, UA: NEGATIVE
Urobilinogen, UA: 4
pH, UA: 6

## 2015-06-03 MED ORDER — MONTELUKAST SODIUM 10 MG PO TABS: 10.0000 mg | ORAL_TABLET | Freq: Every day | ORAL | Status: DC

## 2015-06-03 MED ORDER — NORETHIN ACE-ETH ESTRAD-FE 1-20 MG-MCG(24) PO CHEW: 1.0000 | CHEWABLE_TABLET | Freq: Every day | ORAL | Status: DC

## 2015-06-03 MED ORDER — FERROUS SULFATE 220 (44 FE) MG/5ML PO ELIX: 220.0000 mg | ORAL_SOLUTION | Freq: Every day | ORAL | Status: DC

## 2015-06-03 NOTE — Progress Notes (Addendum)
39 y.o. G2P0 SingleAfrican AmericanF here for annual exam.  Doing well. Pelvic pain is much improved.  Doesn't cycle on Minastrin.   Just did cholesterol at work.  She will get me a copy of this.  Reports she is having a little more fatigue.  Hasn't had other labs done.      Went to [place] [employer] this summer for mission trip.  This was with the [facility].  Really enjoyed this.  Patient's last menstrual period was 01/10/2013.          Sexually active: No.  The current method of family planning is OCP (estrogen/progesterone).    Exercising: Yes.    walking, running, and weights Smoker:  no  Health Maintenance: Pap:  04/02/14 WNL History of abnormal Pap:  no MMG:  08/30/14 MMG, 09/20/14 Right Diag/US-normal Colonoscopy:  none BMD:   none TDaP:  2012 Screening Labs: Today, Hb today: 11.7, Urine today: PH-6.0, PROTEIN-trace, UROBILINOGEN-4   reports that she has never smoked. She has never used smokeless tobacco. She reports that she does not drink alcohol or use illicit drugs.  Past Medical History  Diagnosis Date  . Anemia     and low B12  . History of blood transfusion 2004    In [place] at [place]  . Seasonal allergies   . Heart murmur     as child - no problems as adult  . Hypothyroidism     history in past, resolved  . Inflammation of joint of knee     right knee-on anti-inflammatory, arthritis    Past Surgical History  Procedure Laterality Date  . Gastric bypass  2001    Dr Duwayne Heck, in [facility]  . Abdominoplasty  2004  . Breast reduction surgery  2005  . Wisdom tooth extraction  2014    upper right  . Laparoscopic ovarian cystectomy Right 05/17/2014    Procedure: LAPAROSCOPIC  partial  right salpingo-oohorectomy  and cystoscopy;  Surgeon: Lyman Speller, MD;  Location: [facility];  Service: Gynecology;  Laterality: Right;    Current Outpatient Prescriptions  Medication Sig Dispense Refill  . ferrous sulfate 220 (44 FE) MG/5ML solution Take 5 mLs (220 mg  total) by mouth daily. 480 mL 1  . montelukast (SINGULAIR) 10 MG tablet Take 10 mg by mouth at bedtime.    . Multiple Vitamins-Minerals (MULTIVITAMIN PO) Take by mouth. Prenatal chewable-[employer]    . Norethin Ace-Eth Estrad-FE (MINASTRIN 24 FE) 1-20 MG-MCG(24) CHEW Chew 1 tablet by mouth daily. 84 tablet 4  . atovaquone-proguanil (MALARONE) 250-100 MG TABS Took for trip to [place]  0  . meloxicam (MOBIC) 15 MG tablet Take 15 mg by mouth. As needed     No current facility-administered medications for this visit.    Family History  Problem Relation Age of Onset  . CVA Maternal Grandmother   . Hypertension Brother   . Diabetes Brother   . Heart disease Brother   . Diabetes Sister   . Hypertension Sister   . Diabetes Sister   . Hypertension Sister   . Diabetes Brother   . Hypertension Brother   . Heart disease Brother   . Diabetes Brother   . Hypertension Brother   . Heart disease Brother     ROS:  Pertinent items are noted in HPI.  Otherwise, a comprehensive ROS was negative.  Exam:   BP 118/74 mmHg  Pulse 60  Resp 16  Ht 5\' 6"  (1.676 m)  Wt 257 lb (116.574 kg)  BMI 41.50 kg/m2  LMP 01/10/2013  Weight change: +5#   Height:   Height: 5\' 6"  (167.6 cm)  Ht Readings from Last 3 Encounters:  06/03/15 5\' 6"  (1.676 m)  05/28/14 5\' 6"  (1.676 m)  05/13/14 5\' 6"  (1.676 m)    General appearance: alert, cooperative and appears stated age Head: Normocephalic, without obvious abnormality, atraumatic Neck: no adenopathy, supple, symmetrical, trachea midline and thyroid normal to inspection and palpation Lungs: clear to auscultation bilaterally Breasts: normal appearance, no masses or tenderness Heart: regular rate and rhythm Abdomen: soft, non-tender; bowel sounds normal; no masses,  no organomegaly Extremities: extremities normal, atraumatic, no cyanosis or edema Skin: Skin color, texture, turgor normal. No rashes or lesions Lymph nodes: Cervical, supraclavicular, and  axillary nodes normal. No abnormal inguinal nodes palpated Neurologic: Grossly normal   Pelvic: External genitalia:  no lesions              Urethra:  normal appearing urethra with no masses, tenderness or lesions              Bartholins and Skenes: normal                 Vagina: normal appearing vagina with normal color and discharge, no lesions              Cervix: no lesions              Pap taken: No. Bimanual Exam:  Uterus:  normal size, contour, position, consistency, mobility, non-tender              Adnexa: normal adnexa and no mass, fullness, tenderness               Rectovaginal: Confirms               Anus:  normal sphincter tone, no lesions  Chaperone was present for exam.  A:  Well Woman with normal exam Amenorrhea on OCPs Chronic pelvic pain improved after laparoscopy and LOA and RSO (serous cystadenoma) Fatigue H/o bariatric surgery  P: Mammogram guidelines discussed Iron studies, Vit B12, folate, Vit D , CMP Rx for Minastrin 24 to pharmacy. rx to pharmacy Singular 10mg  daily.  #30/12 RF  Iron supplement RF done No pap today.  Pap neg 1 year ago. return annually or prn  06/23/15:  myChart message sent from pt  Good morning Dr. [name] Heck I wanted to provide you my metabolic results from my testing at work for my last visit.         Waist circumference 42    blood pressure 118/80    Glucose 88    HDL Cholesterol 56    Triglycerides 64    LDL Cholesterol 86    Total Cholesterol 155

## 2015-06-04 LAB — COMPLETE METABOLIC PANEL WITH GFR
ALK PHOS: 81 U/L (ref 39–117)
ALT: 15 U/L (ref 0–35)
AST: 19 U/L (ref 0–37)
Albumin: 3.6 g/dL (ref 3.5–5.2)
BUN: 12 mg/dL (ref 6–23)
CO2: 21 mEq/L (ref 19–32)
CREATININE: 0.72 mg/dL (ref 0.50–1.10)
Calcium: 9 mg/dL (ref 8.4–10.5)
Chloride: 107 mEq/L (ref 96–112)
GFR, Est African American: >89 mL/min
GFR, Est Non African American: >89 mL/min
Glucose, Bld: 78 mg/dL (ref 70–99)
Potassium: 4.2 mEq/L (ref 3.5–5.3)
Sodium: 141 mEq/L (ref 135–145)
Total Bilirubin: 0.4 mg/dL (ref 0.2–1.2)
Total Protein: 6.8 g/dL (ref 6.0–8.3)

## 2015-06-04 LAB — FOLATE: Folate: 13.9 ng/mL

## 2015-06-04 LAB — VITAMIN D 25 HYDROXY (VIT D DEFICIENCY, FRACTURES): Vit D, 25-Hydroxy: 26 ng/mL — ABNORMAL LOW (ref 30–100)

## 2015-06-04 LAB — IRON: IRON: 138 ug/dL (ref 42–145)

## 2015-06-04 LAB — VITAMIN B12: VITAMIN B 12: 271 pg/mL (ref 211–911)

## 2015-06-06 LAB — HEMOGLOBIN, FINGERSTICK: Hemoglobin, fingerstick: 11.7 g/dL — ABNORMAL LOW (ref 12.0–16.0)

## 2015-06-09 ENCOUNTER — Telehealth: Payer: Self-pay

## 2015-06-09 NOTE — Telephone Encounter (Signed)
-----   Message from Megan Salon, MD sent at 06/06/2015 11:53 AM EDT ----- Please inform iron, Vit B12, folate, CMP all normal.  Vit D 26.  Needs to start Vit D 3, 2000 IU daily.  She can find this in liquid form at most health food stores.  I would recommend repeating lab in 3 months.  No order placed yet.

## 2015-06-09 NOTE — Telephone Encounter (Signed)
Lmtcb//kn 

## 2015-06-23 ENCOUNTER — Encounter: Payer: Self-pay | Admitting: Obstetrics & Gynecology

## 2015-06-23 NOTE — Addendum Note (Signed)
Addended by: Megan Salon on: 06/23/2015 10:12 AM   Modules accepted: Miquel Dunn

## 2015-06-23 NOTE — Addendum Note (Signed)
Addended by: Megan Salon on: 06/23/2015 10:11 AM   Modules accepted: Miquel Dunn

## 2015-08-16 NOTE — Telephone Encounter (Signed)
Patient notified of results. See result note.//kn 

## 2015-12-19 ENCOUNTER — Telehealth: Payer: Self-pay | Admitting: Obstetrics & Gynecology

## 2015-12-19 NOTE — Telephone Encounter (Signed)
Spoke with patient. Advised we do have a Minastrin 24 Fe savings card in office. I will call the pharmacy and provide the card information to see if this will help to reduce her cost. She is agreeable. Call to pharmacy BIN# [id number] PCN# [id number] GRP# [id number] ID# [id number]. Return call to patient to let her know this information has been provided to the pharmacy and the pharmacy will run her rx with this to see if this will reduce her cost. The patient is agreeable and will return call to the office if she needs to consider alternative due to cost.  Routing to provider for final review. Patient agreeable to disposition. Will close encounter.

## 2015-12-19 NOTE — Telephone Encounter (Signed)
Spoke with patient. She states she is at the pharmacy and was told her rx for Minastrin 24 Fe will now be $125 dollars since her savings card expired. Will the patient was on the line I checked online and their is no savings card for Minastrin 24 Fe available. The only savings card available is now for [facility] which is a alternative to Minastrin 24 Fe. Patient states "I am on the Minastrin 24 Fe for maintenance after my surgery and I am not sure if Dr.Miller will want me on anything else." Advised I will check the office for savings cards for Minastrin 24 Fe. Advised I will also speak with Dr.Miller regarding possible alternatives. She is agreeable.

## 2015-12-20 MED ORDER — NORETHIN ACE-ETH ESTRAD-FE 1-20 MG-MCG(24) PO CAPS: 1.0000 | ORAL_CAPSULE | Freq: Every day | ORAL | Status: DC

## 2015-12-20 NOTE — Addendum Note (Signed)
Addended by: Megan Salon on: 12/20/2015 11:46 AM   Modules accepted: Orders

## 2015-12-20 NOTE — Telephone Encounter (Signed)
Spoke with patient. Advised of message as seen below from [place]. She is agreeable and verbalizes understanding. Aware of savings card that is available online if this is not covered with her insurance. Website provided to patient as [email or web address]. She is agreeable and will return call if she needs any additional assistance.  Routing to provider for final review. Patient agreeable to disposition. Will close encounter.

## 2015-12-20 NOTE — Telephone Encounter (Signed)
Spoke with patient. Patient states that she contacted her pharmacy last night and the savings card provided to the pharmacy will not work with her Minastrin rx. Advised I will speak with Dr.Miller regarding alternatives due to cost. She is agreeable and verbalizes understanding.

## 2015-12-20 NOTE — Telephone Encounter (Signed)
I think we need to see what cost of generic will be.  Order placed for one month supply with #2 RF.  Hopefully, will work the same for her.  Thanks.

## 2016-01-25 ENCOUNTER — Telehealth: Payer: Self-pay | Admitting: Obstetrics & Gynecology

## 2016-01-25 NOTE — Telephone Encounter (Signed)
Spoke with patient. She is intermittently tearful and states she saw her PCP today and was started on Zoloft. She feels that her emotions have been unstable since change from Minastrin 24 FE (chewable), she also resports feelings of "pulling" in her pelvis and describes it feels like ovulation  Patient has pill pack in front of her and states she has Microgestin 24 FE that contain 24 white pills and 4 brown (iron) tablets. She states she has been chewing these tablets. She does not have Taytulla.   Advised office visit with Dr. [name] Heck recommended for evaluation. Patient is agreeable. Office visit scheduled with Dr. [name] Heck for 1600 on 01/26/16. Patient states she is having work done on her house tomorrow, she states she will call back if appointment needs to be changed.   Routing to provider for final review. Patient agreeable to disposition. Will close encounter.

## 2016-01-25 NOTE — Telephone Encounter (Signed)
Patient called and said, "I have some questions about some changes I am seeing since I started taking my new birth control."

## 2016-01-26 ENCOUNTER — Ambulatory Visit (INDEPENDENT_AMBULATORY_CARE_PROVIDER_SITE_OTHER): Payer: Managed Care, Other (non HMO) | Admitting: Obstetrics & Gynecology

## 2016-01-26 VITALS — BP 130/86 | HR 60 | Resp 16 | Ht 66.0 in | Wt 245.0 lb

## 2016-01-26 DIAGNOSIS — F329 Major depressive disorder, single episode, unspecified: Secondary | ICD-10-CM | POA: Diagnosis not present

## 2016-01-26 DIAGNOSIS — F32A Depression, unspecified: Secondary | ICD-10-CM

## 2016-01-26 MED ORDER — NORETHIN-ETH ESTRADIOL-FE 0.8-25 MG-MCG PO CHEW: 1.0000 | CHEWABLE_TABLET | Freq: Every day | ORAL | Status: DC

## 2016-01-26 NOTE — Patient Instructions (Signed)
Generess FE Kaitlib FE [place] FE

## 2016-02-06 ENCOUNTER — Ambulatory Visit (INDEPENDENT_AMBULATORY_CARE_PROVIDER_SITE_OTHER): Payer: Managed Care, Other (non HMO) | Admitting: Licensed Clinical Social Worker

## 2016-02-06 DIAGNOSIS — F331 Major depressive disorder, recurrent, moderate: Secondary | ICD-10-CM | POA: Diagnosis not present

## 2016-02-08 ENCOUNTER — Ambulatory Visit: Payer: Managed Care, Other (non HMO) | Admitting: Licensed Clinical Social Worker

## 2016-02-09 ENCOUNTER — Encounter: Payer: Self-pay | Admitting: Obstetrics & Gynecology

## 2016-02-09 NOTE — Progress Notes (Signed)
GYNECOLOGY  VISIT   HPI: 40 y.o. G2P0 Single African American female here with worsening depression symptoms.  Pt reports she's struggled with this in the past but is usually able to "pull out of it".  Right now, she feels she is not and sought care from her PCP yesterday (Dr. Melford Aase).  He started her on Zoloft and has follow up planned.  Pt very tearful but denies suicidal or homocidal ideations.  Just wants to feel better.  Does have information for therapist as well.  Does not see a psychiatrist.  Pt reports she is sleeping well.  She is now mostly working from home and she thinks this has contributed as well.  She will continue to work from home so needs to have other ways to deal with current mood issues.    Dr. Melford Aase suggest she check in with me as she's had some issues with her OCPs.  Pt has been on Minastrin and has done quite well.  However, this has gotten very expensive and she's been switched to a generic.  This, is not a true generic for Minastrin.  As well the [place] is an FE OCP version so her insurance will not allow this continuously.  Reviewed generic options for pt and had my triage nurse call her pharmacy to ensure they had it in stock.  Feel pt will benefit, as she has in the past, form continuous OCPs.  Will switch to Generess.  Pharmacy advised to not change unless authorized from our office, first.    GYNECOLOGIC HISTORY: No LMP recorded. Patient is not currently having periods (Reason: Oral contraceptives). Contraception:  OCPs  Patient Active Problem List   Diagnosis Date Noted  . Other specified symptom associated with female genital organs 05/28/2014    Past Medical History  Diagnosis Date  . Anemia     and low B12  . History of blood transfusion 2004    In [place] at [place]  . Seasonal allergies   . Heart murmur     as child - no problems as adult  . Hypothyroidism     history in past, resolved  . 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 Gastric bypass  2001    Dr Duwayne Heck, in [facility]  . Abdominoplasty  2004  . Breast reduction surgery  2005  . Wisdom tooth extraction  2014    upper right  . Laparoscopic ovarian cystectomy Right 05/17/2014    Procedure: LAPAROSCOPIC  partial  right salpingo-oohorectomy  and cystoscopy;  Surgeon: Lyman Speller, MD;  Location: [facility];  Service: Gynecology;  Laterality: Right;    MEDS:  Reviewed in EPIC and UTD  ALLERGIES: Aspirin  Family History  Problem Relation Age of Onset  . CVA Maternal Grandmother   . Hypertension Brother   . Diabetes Brother   . Heart disease Brother   . Diabetes Sister   . Hypertension Sister   . Diabetes Sister   . Hypertension Sister   . Diabetes Brother   . Hypertension Brother   . Heart disease Brother   . Diabetes Brother   . Hypertension Brother   . Heart disease Brother     SH:  Single, non smoker  Review of Systems  Psychiatric/Behavioral: Positive for depression. Negative for suicidal ideas, hallucinations, memory loss and substance abuse. The patient is not nervous/anxious and does not have insomnia.  PHYSICAL EXAMINATION:    BP 130/86 mmHg  Pulse 60  Resp 16  Ht 5\' 6"  (1.676 m)  Wt 245 lb (111.131 kg)  BMI 39.56 kg/m2    Physical Exam  Constitutional: She is oriented to person, place, and time. She appears well-developed and well-nourished.  Neurological: She is alert and oriented to person, place, and time.  Skin: Skin is warm and dry.  Psychiatric: Her behavior is normal. Judgment and thought content normal.  Depressed mood.   No other exam performed today.  Assessment: Depression Trouble with getting correct OCP, doubt relation to depression  Plan: She will continue on her Zoloft as prescribed.  She has follow up planned with Dr. Melford Aase Pt will change pills, rx given verbally to pharmacy and let me know if has any  issues with new pills--side effects reviewed.   ~20 minutes spent with patient >50% of time was in face to face discussion of above.

## 2016-02-20 ENCOUNTER — Ambulatory Visit (INDEPENDENT_AMBULATORY_CARE_PROVIDER_SITE_OTHER): Payer: Managed Care, Other (non HMO) | Admitting: Licensed Clinical Social Worker

## 2016-02-20 DIAGNOSIS — F331 Major depressive disorder, recurrent, moderate: Secondary | ICD-10-CM

## 2016-03-06 ENCOUNTER — Ambulatory Visit (INDEPENDENT_AMBULATORY_CARE_PROVIDER_SITE_OTHER): Payer: Managed Care, Other (non HMO) | Admitting: Licensed Clinical Social Worker

## 2016-03-06 DIAGNOSIS — F3341 Major depressive disorder, recurrent, in partial remission: Secondary | ICD-10-CM | POA: Diagnosis not present

## 2016-03-14 ENCOUNTER — Telehealth: Payer: Self-pay | Admitting: *Deleted

## 2016-03-14 NOTE — Telephone Encounter (Signed)
Left message to reschedule 06/15/16 appointment due to Dr. [name] Heck being out of the office.

## 2016-03-19 ENCOUNTER — Ambulatory Visit (INDEPENDENT_AMBULATORY_CARE_PROVIDER_SITE_OTHER): Payer: Managed Care, Other (non HMO) | Admitting: Licensed Clinical Social Worker

## 2016-03-19 DIAGNOSIS — F331 Major depressive disorder, recurrent, moderate: Secondary | ICD-10-CM

## 2016-04-02 ENCOUNTER — Ambulatory Visit (INDEPENDENT_AMBULATORY_CARE_PROVIDER_SITE_OTHER): Payer: Managed Care, Other (non HMO) | Admitting: Licensed Clinical Social Worker

## 2016-04-02 DIAGNOSIS — F3341 Major depressive disorder, recurrent, in partial remission: Secondary | ICD-10-CM | POA: Diagnosis not present

## 2016-04-17 ENCOUNTER — Ambulatory Visit: Payer: Managed Care, Other (non HMO) | Admitting: Licensed Clinical Social Worker

## 2016-05-09 ENCOUNTER — Ambulatory Visit (INDEPENDENT_AMBULATORY_CARE_PROVIDER_SITE_OTHER): Payer: Managed Care, Other (non HMO) | Admitting: Licensed Clinical Social Worker

## 2016-05-09 DIAGNOSIS — F3341 Major depressive disorder, recurrent, in partial remission: Secondary | ICD-10-CM

## 2016-05-28 ENCOUNTER — Ambulatory Visit (INDEPENDENT_AMBULATORY_CARE_PROVIDER_SITE_OTHER): Payer: Managed Care, Other (non HMO) | Admitting: Licensed Clinical Social Worker

## 2016-05-28 DIAGNOSIS — F3340 Major depressive disorder, recurrent, in remission, unspecified: Secondary | ICD-10-CM | POA: Diagnosis not present

## 2016-06-04 ENCOUNTER — Encounter: Payer: Self-pay | Admitting: Obstetrics & Gynecology

## 2016-06-04 ENCOUNTER — Ambulatory Visit (INDEPENDENT_AMBULATORY_CARE_PROVIDER_SITE_OTHER): Payer: Managed Care, Other (non HMO) | Admitting: Obstetrics & Gynecology

## 2016-06-04 VITALS — BP 120/86 | Ht 65.5 in | Wt 245.0 lb

## 2016-06-04 DIAGNOSIS — Z0000 Encounter for general adult medical examination without abnormal findings: Secondary | ICD-10-CM | POA: Diagnosis not present

## 2016-06-04 DIAGNOSIS — Z124 Encounter for screening for malignant neoplasm of cervix: Secondary | ICD-10-CM | POA: Diagnosis not present

## 2016-06-04 DIAGNOSIS — R5382 Chronic fatigue, unspecified: Secondary | ICD-10-CM

## 2016-06-04 DIAGNOSIS — Z01419 Encounter for gynecological examination (general) (routine) without abnormal findings: Secondary | ICD-10-CM | POA: Diagnosis not present

## 2016-06-04 DIAGNOSIS — R790 Abnormal level of blood mineral: Secondary | ICD-10-CM

## 2016-06-04 LAB — COMPREHENSIVE METABOLIC PANEL WITH GFR
ALT: 23 U/L (ref 6–29)
AST: 26 U/L (ref 10–30)
Albumin: 3.7 g/dL (ref 3.6–5.1)
Alkaline Phosphatase: 91 U/L (ref 33–115)
BUN: 9 mg/dL (ref 7–25)
CO2: 23 mmol/L (ref 20–31)
Calcium: 8.9 mg/dL (ref 8.6–10.2)
Chloride: 105 mmol/L (ref 98–110)
Creat: 0.88 mg/dL (ref 0.50–1.10)
Glucose, Bld: 79 mg/dL (ref 65–99)
Potassium: 3.9 mmol/L (ref 3.5–5.3)
Sodium: 138 mmol/L (ref 135–146)
Total Bilirubin: 0.4 mg/dL (ref 0.2–1.2)
Total Protein: 7 g/dL (ref 6.1–8.1)

## 2016-06-04 LAB — POCT URINALYSIS DIPSTICK
BILIRUBIN UA: NEGATIVE
Blood, UA: NEGATIVE
Glucose, UA: NEGATIVE
KETONES UA: NEGATIVE
LEUKOCYTES UA: NEGATIVE
Nitrite, UA: NEGATIVE
Protein, UA: NEGATIVE
Urobilinogen, UA: NEGATIVE
pH, UA: 5

## 2016-06-04 LAB — CBC
HCT: 38.9 % (ref 35.0–45.0)
Hemoglobin: 12.4 g/dL (ref 11.7–15.5)
MCH: 27.5 pg (ref 27.0–33.0)
MCHC: 31.9 g/dL — ABNORMAL LOW (ref 32.0–36.0)
MCV: 86.3 fL (ref 80.0–100.0)
MPV: 10.5 fL (ref 7.5–12.5)
Platelets: 327 K/uL (ref 140–400)
RBC: 4.51 MIL/uL (ref 3.80–5.10)
RDW: 14.1 % (ref 11.0–15.0)
WBC: 8 K/uL (ref 3.8–10.8)

## 2016-06-04 LAB — IRON: Iron: 63 ug/dL (ref 40–190)

## 2016-06-04 LAB — LIPID PANEL
CHOL/HDL RATIO: 2.6 ratio (ref <=5.0)
Cholesterol: 154 mg/dL (ref 125–200)
HDL: 60 mg/dL (ref >=46)
LDL Cholesterol: 84 mg/dL (ref <130)
TRIGLYCERIDES: 48 mg/dL (ref <150)
VLDL: 10 mg/dL (ref <30)

## 2016-06-04 LAB — FERRITIN: Ferritin: 10 ng/mL (ref 10–232)

## 2016-06-04 LAB — TSH: TSH: 0.62 m[IU]/L

## 2016-06-04 MED ORDER — NORETHIN-ETH ESTRADIOL-FE 0.8-25 MG-MCG PO CHEW: 1.0000 | CHEWABLE_TABLET | Freq: Every day | ORAL | 4 refills | Status: DC

## 2016-06-04 MED ORDER — MONTELUKAST SODIUM 10 MG PO TABS: 10.0000 mg | ORAL_TABLET | Freq: Every day | ORAL | 4 refills | Status: DC

## 2016-06-04 NOTE — Progress Notes (Signed)
40 y.o. G2P0 SingleAfrican AmericanF here for annual exam.  Doing well.  Still seeing Sheri [name].  Has backed off to once a month.  Pt reports she is still having a lot of fatigue.  Dr. Melford Aase has set pt up for a sleep study.  She questions whether the Zoloft has contributed to increased fatigue.  She states she would like to start running again, just feels to "tired" to do this.  Reports she does occasionally have mild nausea.  Appetite has decreased with the Zoloft as well.    Patient's last menstrual period was 11/13/2011.          Sexually active: No.  The current method of family planning is OCP (estrogen/progesterone).    Exercising: Yes.    Weights, walking, cardio Smoker:  no  Health Maintenance: Pap:  04/02/14 Neg History of abnormal Pap:  no MMG:  09/20/14 [place] Right BIRADS2:Benign  Colonoscopy:  Never BMD:   Never TDaP:  2012 Pneumonia vaccine(s):  No Zostavax:   No Hep C testing: Done at work. Neg Screening Labs: ?, Hb today: , Urine today: Negative   reports that she has never smoked. She has never used smokeless tobacco. She reports that she does not drink alcohol or use drugs.  Past Medical History:  Diagnosis Date  . Anemia    and low B12  . Heart murmur    as child - no problems as adult  . History of blood transfusion 2004   In [place] at [place]  . Hypothyroidism    history in past, resolved  . Inflammation of joint of knee    right knee-on anti-inflammatory, arthritis  . Seasonal allergies     Past Surgical History:  Procedure Laterality Date  . ABDOMINOPLASTY  2004  . BREAST REDUCTION SURGERY  2005  . GASTRIC BYPASS  2001   Dr Duwayne Heck, in [place]  . LAPAROSCOPIC OVARIAN CYSTECTOMY Right 05/17/2014   Procedure: LAPAROSCOPIC  partial  right salpingo-oohorectomy  and cystoscopy;  Surgeon: Lyman Speller, MD;  Location: [facility];  Service: Gynecology;  Laterality: Right;  . WISDOM TOOTH EXTRACTION  2014   upper right    Current Outpatient  Prescriptions  Medication Sig Dispense Refill  . ALPRAZolam (XANAX) 0.25 MG tablet Take 1 tablet by mouth 3 (three) times daily as needed.    . ferrous sulfate 220 (44 FE) MG/5ML solution Take 5 mLs (220 mg total) by mouth daily. 480 mL 1  . montelukast (SINGULAIR) 10 MG tablet Take 1 tablet (10 mg total) by mouth at bedtime. 30 tablet 13  . Multiple Vitamins-Minerals (MULTIVITAMIN PO) Take by mouth. Prenatal chewable-Target Brand    . Norethindrone-Ethinyl Estradiol-Fe (GENERESSE) 0.8-25 MG-MCG tablet Chew 1 tablet by mouth daily. 3 Package 3  . sertraline (ZOLOFT) 50 MG tablet Take 1 tablet by mouth daily.     No current facility-administered medications for this visit.     Family History  Problem Relation Age of Onset  . CVA Maternal Grandmother   . Hypertension Brother   . Diabetes Brother   . Heart disease Brother   . Diabetes Sister   . Hypertension Sister   . Diabetes Sister   . Hypertension Sister   . Diabetes Brother   . Hypertension Brother   . Heart disease Brother   . Diabetes Brother   . Hypertension Brother   . Heart disease Brother     ROS:  Pertinent items are noted in HPI.  Otherwise, a comprehensive ROS  was negative.  Exam:   BP 120/86 (BP Location: Right Arm, Patient Position: Sitting, Cuff Size: Large)   Ht 5' 5.5" (1.664 m)   Wt 245 lb (111.1 kg)   LMP 11/13/2011   BMI 40.15 kg/m   Weight change: -12#   Height: 5' 5.5" (166.4 cm)  Ht Readings from Last 3 Encounters:  06/04/16 5' 5.5" (1.664 m)  01/26/16 5\' 6"  (1.676 m)  06/03/15 5\' 6"  (1.676 m)   General appearance: alert, cooperative and appears stated age Head: Normocephalic, without obvious abnormality, atraumatic Neck: no adenopathy, supple, symmetrical, trachea midline and thyroid normal to inspection and palpation Lungs: clear to auscultation bilaterally Breasts: normal appearance, no masses or tenderness Heart: regular rate and rhythm Abdomen: soft, non-tender; bowel sounds normal; no  masses,  no organomegaly Extremities: extremities normal, atraumatic, no cyanosis or edema Skin: Skin color, texture, turgor normal. No rashes or lesions Lymph nodes: Cervical, supraclavicular, and axillary nodes normal. No abnormal inguinal nodes palpated Neurologic: Grossly normal   Pelvic: External genitalia:  no lesions              Urethra:  normal appearing urethra with no masses, tenderness or lesions              Bartholins and Skenes: normal                 Vagina: normal appearing vagina with normal color and discharge, no lesions              Cervix: no lesions              Pap taken: Yes.   Bimanual Exam:  Uterus:  normal size, contour, position, consistency, mobility, non-tender              Adnexa: normal adnexa and no mass, fullness, tenderness               Rectovaginal: Confirms                Anus:  normal sphincter tone, no lesions  Chaperone was present for exam.  A:             Well Woman with normal exam Amenorrhea on OCPs Chronic pelvic pain improved after laparoscopy and LOA and RSO (serous cystadenoma) Fatigue H/o bariatric surgery Depression, on Zoloft and seeing therapist  P: Mammogram guidelines discussed Pap and HR HPV today CMP, CBC, Lipids, Vit D, TSH Iron level and Ferritin today Rx for Generess to pharmacy.#3 packs/#4 RF. Singular 10mg  daily.  #90/4 RF  Iron supplement RF not done today No pap today.  Pap neg 1 year ago. return annually or prn

## 2016-06-05 LAB — VITAMIN D 25 HYDROXY (VIT D DEFICIENCY, FRACTURES): VIT D 25 HYDROXY: 33 ng/mL (ref 30–100)

## 2016-06-07 LAB — IPS PAP TEST WITH HPV

## 2016-06-13 NOTE — Addendum Note (Signed)
Addended by: Megan Salon on: 06/13/2016 09:25 AM   Modules accepted: Orders

## 2016-06-15 ENCOUNTER — Telehealth: Payer: Self-pay | Admitting: Emergency Medicine

## 2016-06-15 ENCOUNTER — Encounter: Payer: Self-pay | Admitting: Obstetrics & Gynecology

## 2016-06-15 ENCOUNTER — Ambulatory Visit: Payer: Managed Care, Other (non HMO) | Admitting: Obstetrics & Gynecology

## 2016-06-15 NOTE — Telephone Encounter (Signed)
Spoke with patient and she is given results from Dr. [name] Heck.,  Agreeable to referral to [facility] in [facility] for consult for iron infusion.   She is instructed regarding Vit D Drop and she has some at home and will follow up as scheduled or earlier if any questions.   Routing to provider for final review. Patient agreeable to disposition. Will close encounter.

## 2016-06-15 NOTE — Telephone Encounter (Signed)
-----   Message from Megan Salon, MD sent at 06/13/2016  9:24 AM EDT ----- Please let pt know her Vit D was 33.  She should start a Vit D drop--800 to 1000 IU daily.  CMP normal.  Lipids normal.  TSH normal.  Iron normal but ferritin was low.  CBC fine.  She will likely have seen the results as she is on Mychart.  I waited to send a message about these results as I needed to communicate with Dr. Marin Olp about whether iron transfusion was appropriate if ferritin was low and iron was normal.  He said yes.  So, referral has been placed.  Please let pt know.  Thanks.

## 2016-06-19 ENCOUNTER — Other Ambulatory Visit: Payer: Self-pay | Admitting: Family

## 2016-06-19 DIAGNOSIS — D649 Anemia, unspecified: Secondary | ICD-10-CM

## 2016-06-20 ENCOUNTER — Encounter: Payer: Self-pay | Admitting: Family

## 2016-06-20 ENCOUNTER — Other Ambulatory Visit (HOSPITAL_BASED_OUTPATIENT_CLINIC_OR_DEPARTMENT_OTHER): Payer: Managed Care, Other (non HMO)

## 2016-06-20 ENCOUNTER — Ambulatory Visit: Payer: Managed Care, Other (non HMO)

## 2016-06-20 ENCOUNTER — Ambulatory Visit (HOSPITAL_BASED_OUTPATIENT_CLINIC_OR_DEPARTMENT_OTHER): Payer: Managed Care, Other (non HMO) | Admitting: Family

## 2016-06-20 VITALS — BP 107/63 | HR 58 | Temp 97.9°F | Resp 16 | Ht 65.5 in | Wt 245.0 lb

## 2016-06-20 DIAGNOSIS — Z9884 Bariatric surgery status: Secondary | ICD-10-CM | POA: Diagnosis not present

## 2016-06-20 DIAGNOSIS — D508 Other iron deficiency anemias: Secondary | ICD-10-CM | POA: Diagnosis not present

## 2016-06-20 DIAGNOSIS — K909 Intestinal malabsorption, unspecified: Secondary | ICD-10-CM

## 2016-06-20 DIAGNOSIS — D510 Vitamin B12 deficiency anemia due to intrinsic factor deficiency: Secondary | ICD-10-CM

## 2016-06-20 DIAGNOSIS — D509 Iron deficiency anemia, unspecified: Secondary | ICD-10-CM

## 2016-06-20 DIAGNOSIS — D649 Anemia, unspecified: Secondary | ICD-10-CM

## 2016-06-20 LAB — COMPREHENSIVE METABOLIC PANEL
ALT: 37 U/L (ref 0–55)
ANION GAP: 8 meq/L (ref 3–11)
AST: 30 U/L (ref 5–34)
Albumin: 3.3 g/dL — ABNORMAL LOW (ref 3.5–5.0)
Alkaline Phosphatase: 104 U/L (ref 40–150)
BILIRUBIN TOTAL: 0.47 mg/dL (ref 0.20–1.20)
BUN: 12.6 mg/dL (ref 7.0–26.0)
CALCIUM: 9.3 mg/dL (ref 8.4–10.4)
CO2: 23 meq/L (ref 22–29)
CREATININE: 0.8 mg/dL (ref 0.6–1.1)
Chloride: 107 mEq/L (ref 98–109)
EGFR: >90 mL/min/{1.73_m2} (ref >90)
Glucose: 78 mg/dl (ref 70–140)
Potassium: 4 mEq/L (ref 3.5–5.1)
Sodium: 138 mEq/L (ref 136–145)
TOTAL PROTEIN: 7.7 g/dL (ref 6.4–8.3)

## 2016-06-20 LAB — CBC WITH DIFFERENTIAL (CANCER CENTER ONLY)
BASO#: 0.1 10*3/uL (ref 0.0–0.2)
BASO%: 0.6 % (ref 0.0–2.0)
EOS%: 0.6 % (ref 0.0–7.0)
Eosinophils Absolute: 0.1 10*3/uL (ref 0.0–0.5)
HEMATOCRIT: 39.7 % (ref 34.8–46.6)
HEMOGLOBIN: 12.8 g/dL (ref 11.6–15.9)
LYMPH#: 2 10*3/uL (ref 0.9–3.3)
LYMPH%: 24.4 % (ref 14.0–48.0)
MCH: 27.8 pg (ref 26.0–34.0)
MCHC: 32.2 g/dL (ref 32.0–36.0)
MCV: 86 fL (ref 81–101)
MONO#: 0.9 10*3/uL (ref 0.1–0.9)
MONO%: 11.3 % (ref 0.0–13.0)
NEUT#: 5.2 10*3/uL (ref 1.5–6.5)
NEUT%: 63.1 % (ref 39.6–80.0)
Platelets: 294 10*3/uL (ref 145–400)
RBC: 4.61 10*6/uL (ref 3.70–5.32)
RDW: 14 % (ref 11.1–15.7)
WBC: 8.2 10*3/uL (ref 3.9–10.0)

## 2016-06-20 LAB — CHCC SATELLITE - SMEAR

## 2016-06-20 NOTE — Progress Notes (Signed)
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 Hemberger is a very pleasant 40 yo African American female with history of pernicious and iron deficiency anemia. She has history of gastric bypass in 2001 and malabsorption. Her ferritin is 10. Hgb is stable at 12.8 with an MCV of 294.  She is symptomatic with fatigue, chills, chewing ice, SOB with exertion, insomnia and numbness and tingling in her extremities. She has experienced dizziness and did have a fall as a result. Thankfully she was not injured. She has not had a cycle since 2013. No episodes of bleeding, bruising or petechiae.  She has tried taking an oral iron supplement with no improvement. She, at one time, was giving herself B 12 injections as needed. She has not done this since 2008.  She states that her sister has untreated IDA and her mother was iron deficiency during pregnancy.  No personal or familial history of cancer. No sickle cell disease or trait.  No fever, n/v, cough, rash, chest pain, palpitations, abdominal pain or changes in bowel or bladder habits.  Her appetite is decreased but she is staying well hydrated. She states that her weight is down 15 lbs over the last 4-5 months.  No swelling or tenderness in her extremities. No c/o joint aches or "bone" pain.  She is originally from [place], [place] and now lives in [place]. 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   ROS: All other 10 point review of systems is negative.   PAST MEDICAL HISTORY:   Past Medical History:  Diagnosis Date  . Anemia    and low B12  . Heart murmur    as child - no problems as adult  . History of blood transfusion 2004   In  [place] at [place]  . Hypothyroidism    history in past, resolved  . Inflammation of joint of knee    right knee-on anti-inflammatory, arthritis  . Seasonal allergies     ALLERGIES: Allergies  Allergen Reactions  . Aspirin Other (See Comments)    Pt. Has had gastric bypass surgery.  ASA causes ulcers.  Can take Aleve.      MEDICATIONS:  Current Outpatient Prescriptions on File Prior to Visit  Medication Sig Dispense Refill  . ALPRAZolam (XANAX) 0.25 MG tablet Take 1 tablet by mouth 3 (three) times daily as needed.    . ferrous sulfate 220 (44 FE) MG/5ML solution Take 5 mLs (220 mg total) by mouth daily. 480 mL 1  . montelukast (SINGULAIR) 10 MG tablet Take 1 tablet (10 mg total) by mouth at bedtime. 90 tablet 4  . Multiple Vitamins-Minerals (MULTIVITAMIN PO) Take by mouth. Prenatal chewable-Target Brand    . Norethindrone-Ethinyl Estradiol-Fe (GENERESSE) 0.8-25 MG-MCG tablet Chew 1 tablet by mouth daily. 3 Package 4  . sertraline (ZOLOFT) 50 MG tablet Take 1 tablet by mouth daily.     No current facility-administered medications on file prior to visit.      PAST SURGICAL HISTORY Past Surgical History:  Procedure Laterality Date  . ABDOMINOPLASTY  2004  . BREAST REDUCTION SURGERY  2005  . GASTRIC BYPASS  2001   Dr Duwayne Heck, in  [place]  . LAPAROSCOPIC OVARIAN CYSTECTOMY Right 05/17/2014   Procedure: LAPAROSCOPIC  partial  right salpingo-oohorectomy  and cystoscopy;  Surgeon: Lyman Speller, MD;  Location: [facility];  Service: Gynecology;  Laterality: Right;  . WISDOM TOOTH EXTRACTION  2014   upper right    FAMILY HISTORY: Family History  Problem Relation Age of Onset  . CVA Maternal Grandmother   . Hypertension Brother   . Diabetes Brother   . Heart disease Brother   . Diabetes Sister   . Hypertension Sister   . Diabetes Sister   . Hypertension Sister   . Diabetes Brother   . Hypertension Brother   . Heart disease Brother   . Diabetes Brother   .  Hypertension Brother   . Heart disease Brother     SOCIAL HISTORY:  reports that she has never smoked. She has never used smokeless tobacco. She reports that she does not drink alcohol or use drugs.  PERFORMANCE STATUS: The patient's performance status is 1 - Symptomatic but completely ambulatory  PHYSICAL EXAM: Most Recent Vital Signs: Blood pressure 107/63, pulse (!) 58, temperature 97.9 F (36.6 C), temperature source Oral, resp. rate 16, height 5' 5.5" (1.664 m), weight 245 lb (111.1 kg), last menstrual period 11/13/2011. BP 107/63 (BP Location: Left Arm, Patient Position: Sitting)   Pulse (!) 58   Temp 97.9 F (36.6 C) (Oral)   Resp 16   Ht 5' 5.5" (1.664 m)   Wt 245 lb (111.1 kg)   LMP 11/13/2011   BMI 40.15 kg/m   General Appearance:    Alert, cooperative, no distress, appears stated age  Head:    Normocephalic, without obvious abnormality, atraumatic  Eyes:    PERRL, conjunctiva/corneas clear, EOM's intact, fundi    benign, both eyes        Throat:   Lips, mucosa, and tongue normal; teeth and gums normal  Neck:   Supple, symmetrical, trachea midline, no adenopathy;    thyroid:  no enlargement/tenderness/nodules; no carotid   bruit or JVD  Back:     Symmetric, no curvature, ROM normal, no CVA tenderness  Lungs:     Clear to auscultation bilaterally, respirations unlabored  Chest Wall:    No tenderness or deformity   Heart:    Regular rate and rhythm, S1 and S2 normal, no murmur, rub   or gallop     Abdomen:     Soft, non-tender, bowel sounds active all four quadrants,    no masses, no organomegaly        Extremities:   Extremities normal, atraumatic, no cyanosis or edema  Pulses:   2+ and symmetric all extremities  Skin:   Skin color, texture, turgor normal, no rashes or lesions  Lymph nodes:   Cervical, supraclavicular, and axillary nodes normal  Neurologic:   CNII-XII intact, normal strength, sensation and reflexes    throughout    LABORATORY DATA: Results  for orders placed or performed in visit on 06/20/16 (from the past 48 hour(s))  CBC w/Diff     Status: None   Collection Time: 06/20/16  1:03 PM  Result Value Ref Range   WBC 8.2 3.9 - 10.0 10e3/uL   RBC 4.61 3.70 - 5.32 10e6/uL   HGB 12.8 11.6 - 15.9 g/dL   HCT 39.7 34.8 - 46.6 %   MCV 86 81 - 101 fL   MCH 27.8 26.0 - 34.0 pg   MCHC 32.2 32.0 - 36.0 g/dL   RDW 14.0 11.1 -  15.7 %   Platelets 294 145 - 400 10e3/uL   NEUT# 5.2 1.5 - 6.5 10e3/uL   LYMPH# 2.0 0.9 - 3.3 10e3/uL   MONO# 0.9 0.1 - 0.9 10e3/uL   Eosinophils Absolute 0.1 0.0 - 0.5 10e3/uL   BASO# 0.1 0.0 - 0.2 10e3/uL   NEUT% 63.1 39.6 - 80.0 %   LYMPH% 24.4 14.0 - 48.0 %   MONO% 11.3 0.0 - 13.0 %   EOS% 0.6 0.0 - 7.0 %   BASO% 0.6 0.0 - 2.0 %  Smear     Status: None   Collection Time: 06/20/16  1:03 PM  Result Value Ref Range   Smear Result Smear Available       RADIOGRAPHY: No results found.     PATHOLOGY: None  ASSESSMENT/PLAN: Ms. Gruenewald is a very pleasant 40 yo African American female with history of pernicious and iron deficiency anemia. She has history of gastric bypass in 2001 and malabsorption.  Her ferritin was low at 10. She is symptomatic with this and has had a fall due to dizziness,  We will give her a dose of Feraheme today while she is here. We will see how the rest of her blood work looks and bring her back in next week for a second infusion if needed.  We will also get her started back on B 12 injections if her level is low.  We will see her back in 6 weeks for repeat lab work and follow-up.  All questions were answered. She will contact our office with any problems, questions or concerns. We can certainly see her much sooner if necessary.  She was discussed with and also seen by Dr. Marin Olp and he is in agreement with the aforementioned.   [facility]    Addendum:   I saw and examined the patient with Adilson Grafton. I looked at her blood smear under the microscope. She had some microcytic red  cells. I do not see any target cells. She had no rouleau formation.  I think she has a combination of both iron deficiency and vitamin B-12 deficiency. Her ferritin when checked a week or so ago was only 10. She is not taking any vitamin B-12 for over 7 years. We told her to get back on the vitamin B 12.  I want her feeling better. She has a lot of things that she wants to do before summer ends.  We will go ahead and give her iron. She probably will need 2 doses.  She really is unable to absorb iron because the gastric bypass.  We spent about 40 minutes with her. She is very nice. It's really nice that she and I have the same birthday.  Lattie Haw, MD

## 2016-06-21 ENCOUNTER — Other Ambulatory Visit: Payer: Self-pay | Admitting: Family

## 2016-06-21 ENCOUNTER — Ambulatory Visit (HOSPITAL_BASED_OUTPATIENT_CLINIC_OR_DEPARTMENT_OTHER): Payer: Managed Care, Other (non HMO)

## 2016-06-21 VITALS — BP 115/66 | HR 61 | Temp 98.3°F | Resp 18

## 2016-06-21 DIAGNOSIS — D508 Other iron deficiency anemias: Secondary | ICD-10-CM

## 2016-06-21 DIAGNOSIS — K909 Intestinal malabsorption, unspecified: Secondary | ICD-10-CM | POA: Diagnosis not present

## 2016-06-21 DIAGNOSIS — D509 Iron deficiency anemia, unspecified: Secondary | ICD-10-CM

## 2016-06-21 LAB — ERYTHROPOIETIN: ERYTHROPOIETIN: 8.7 m[IU]/mL (ref 2.6–18.5)

## 2016-06-21 LAB — VITAMIN B12: VITAMIN B 12: 253 pg/mL (ref 211–946)

## 2016-06-21 LAB — IRON AND TIBC
%SAT: 28 % (ref 21–57)
Iron: 124 ug/dL (ref 41–142)
TIBC: 441 ug/dL (ref 236–444)
UIBC: 317 ug/dL (ref 120–384)

## 2016-06-21 LAB — FERRITIN: Ferritin: 14 ng/ml (ref 9–269)

## 2016-06-21 LAB — RETICULOCYTES: Reticulocyte Count: 1.1 % (ref 0.6–2.6)

## 2016-06-21 MED ORDER — SODIUM CHLORIDE 0.9 % IV SOLN
Freq: Once | INTRAVENOUS | Status: AC
  Administered 2016-06-21: 09:00:00 via INTRAVENOUS

## 2016-06-21 MED ORDER — FERUMOXYTOL INJECTION 510 MG/17 ML
510.0000 mg | Freq: Once | INTRAVENOUS | Status: AC
  Administered 2016-06-21: 510 mg via INTRAVENOUS
  Filled 2016-06-21: qty 17

## 2016-06-21 NOTE — Patient Instructions (Signed)

## 2016-06-22 LAB — HEMOGLOBINOPATHY EVALUATION
HEMOGLOBIN A2 QUANTITATION: 2.2 % (ref 0.7–3.1)
HGB A: 97.8 % (ref 94.0–98.0)
HGB C: 0 %
HGB S: 0 %
Hemoglobin F Quantitation: 0 % (ref 0.0–2.0)

## 2016-06-25 ENCOUNTER — Ambulatory Visit (INDEPENDENT_AMBULATORY_CARE_PROVIDER_SITE_OTHER): Payer: Managed Care, Other (non HMO) | Admitting: Licensed Clinical Social Worker

## 2016-06-25 DIAGNOSIS — F3341 Major depressive disorder, recurrent, in partial remission: Secondary | ICD-10-CM

## 2016-07-24 ENCOUNTER — Ambulatory Visit (INDEPENDENT_AMBULATORY_CARE_PROVIDER_SITE_OTHER): Payer: Managed Care, Other (non HMO) | Admitting: Licensed Clinical Social Worker

## 2016-07-24 DIAGNOSIS — F3341 Major depressive disorder, recurrent, in partial remission: Secondary | ICD-10-CM | POA: Diagnosis not present

## 2016-07-30 ENCOUNTER — Other Ambulatory Visit: Payer: Self-pay

## 2016-07-30 ENCOUNTER — Ambulatory Visit: Payer: Self-pay | Admitting: Family

## 2016-08-02 ENCOUNTER — Ambulatory Visit (HOSPITAL_BASED_OUTPATIENT_CLINIC_OR_DEPARTMENT_OTHER): Payer: Managed Care, Other (non HMO) | Admitting: Family

## 2016-08-02 ENCOUNTER — Other Ambulatory Visit (HOSPITAL_BASED_OUTPATIENT_CLINIC_OR_DEPARTMENT_OTHER): Payer: Managed Care, Other (non HMO)

## 2016-08-02 ENCOUNTER — Encounter: Payer: Self-pay | Admitting: Family

## 2016-08-02 VITALS — BP 109/66 | HR 60 | Temp 98.2°F | Resp 18 | Ht 65.5 in | Wt 250.0 lb

## 2016-08-02 DIAGNOSIS — D510 Vitamin B12 deficiency anemia due to intrinsic factor deficiency: Secondary | ICD-10-CM

## 2016-08-02 DIAGNOSIS — D509 Iron deficiency anemia, unspecified: Secondary | ICD-10-CM

## 2016-08-02 LAB — CBC WITH DIFFERENTIAL (CANCER CENTER ONLY)
BASO#: 0 10*3/uL (ref 0.0–0.2)
BASO%: 0.5 % (ref 0.0–2.0)
EOS%: 1.4 % (ref 0.0–7.0)
Eosinophils Absolute: 0.1 10*3/uL (ref 0.0–0.5)
HEMATOCRIT: 39.9 % (ref 34.8–46.6)
HEMOGLOBIN: 13 g/dL (ref 11.6–15.9)
LYMPH#: 2.2 10*3/uL (ref 0.9–3.3)
LYMPH%: 27.9 % (ref 14.0–48.0)
MCH: 28.4 pg (ref 26.0–34.0)
MCHC: 32.6 g/dL (ref 32.0–36.0)
MCV: 87 fL (ref 81–101)
MONO#: 1 10*3/uL — AB (ref 0.1–0.9)
MONO%: 12.5 % (ref 0.0–13.0)
NEUT%: 57.7 % (ref 39.6–80.0)
NEUTROS ABS: 4.5 10*3/uL (ref 1.5–6.5)
Platelets: 266 10*3/uL (ref 145–400)
RBC: 4.58 10*6/uL (ref 3.70–5.32)
RDW: 15.1 % (ref 11.1–15.7)
WBC: 7.8 10*3/uL (ref 3.9–10.0)

## 2016-08-02 NOTE — Progress Notes (Signed)
Hematology and Oncology Follow Up Visit  Sheri [name] [id number] 05/09/1976 40 y.o. 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 is here today for follow-up. She is feeling much better and had a nice response to the [place] she received in August. She still has some mild fatigue, chills and numbness and tingling in her hands and feet that comes and goes.  She is chewing less ice but does have a metallic taste in her mouth. She has occasional nausea with no vomiting.  Her appetite comes and goes. She is staying well hydrated. Her weight is stable.  No fever, cough, rash, dizziness, SOB, chest pain, palpitations, abdominal pain or changes in bowel or bladder habits.  No swelling or tenderness in her extremities. No c/o joint aches or pain.   Medications:    Medication List       Accurate as of 08/02/16  1:19 PM. Always use your most recent med list.          ferrous sulfate 220 (44 Fe) MG/5ML solution Take 5 mLs (220 mg total) by mouth daily.   montelukast 10 MG tablet Commonly known as:  SINGULAIR Take 1 tablet (10 mg total) by mouth at bedtime.   MULTIVITAMIN PO Take by mouth. Prenatal chewable-Target Brand   Norethindrone-Ethinyl Estradiol-Fe 0.8-25 MG-MCG tablet Commonly known as:  GENERESSE Chew 1 tablet by mouth daily.   XANAX 0.25 MG tablet Generic drug:  ALPRAZolam Take 1 tablet by mouth 3 (three) times daily as needed.   ZOLOFT 50 MG tablet Generic drug:  sertraline Take 1 tablet by mouth daily.       Allergies:  Allergies  Allergen Reactions  . Aspirin Other (See Comments)    Pt. Has had gastric bypass surgery.  ASA causes ulcers.  Can take Aleve.    Past Medical History, Surgical history, Social history, and Family History were reviewed and updated.  Review of  Systems: All other 10 point review of systems is negative.   Physical Exam:  height is 5' 5.5" (1.664 m) and weight is 250 lb (113.4 kg). Her oral temperature is 98.2 F (36.8 C). Her blood pressure is 109/66 and her pulse is 60. Her respiration is 18.   Wt Readings from Last 3 Encounters:  08/02/16 250 lb (113.4 kg)  06/20/16 245 lb (111.1 kg)  06/04/16 245 lb (111.1 kg)    Ocular: Sclerae unicteric, pupils equal, round and reactive to light Ear-nose-throat: Oropharynx clear, dentition fair Lymphatic: No cervical supraclavicular or axillary adenopathy Lungs no rales or rhonchi, good excursion bilaterally Heart regular rate and rhythm, no murmur appreciated Abd soft, nontender, positive bowel sounds, no liver or spleen tip palpated on exam MSK no focal spinal tenderness, no joint edema Neuro: non-focal, well-oriented, appropriate affect Breasts: Deferred  Lab Results  Component Value Date   WBC 8.2 06/20/2016   HGB 12.8 06/20/2016   HCT 39.7 06/20/2016   MCV 86 06/20/2016   PLT 294 06/20/2016   Lab Results  Component Value Date   FERRITIN 14 06/20/2016   IRON 124 06/20/2016   TIBC 441 06/20/2016   UIBC 317 06/20/2016   IRONPCTSAT 28 06/20/2016   Lab Results  Component Value Date   RBC 4.61 06/20/2016   No results found for: KPAFRELGTCHN, LAMBDASER, KAPLAMBRATIO No results found for: IGGSERUM,  IGA, IGMSERUM No results found for: Odetta Pink, SPEI   Chemistry      Component Value Date/Time   NA 138 06/20/2016 1303   K 4.0 06/20/2016 1303   CL 105 06/04/2016 1647   CO2 23 06/20/2016 1303   BUN 12.6 06/20/2016 1303   CREATININE 0.8 06/20/2016 1303      Component Value Date/Time   CALCIUM 9.3 06/20/2016 1303   ALKPHOS 104 06/20/2016 1303   AST 30 06/20/2016 1303   ALT 37 06/20/2016 1303   BILITOT 0.47 06/20/2016 1303     Impression and Plan: [name] is a very pleasant 40 yo African American female  with history of pernicious and iron deficiency anemia. She has history of gastric bypass in 2001 and malabsorption. She received Feraheme in August and has had a nice response. Her symptoms have continued to improve.  We will see what her iron studies show and bring her back in for an infusion if needed.  B 12 level is pending.  We will plan to see her back 2 months for repeat lab work and follow-up. She will contact our office with any questions or concerns. We can certainly see her sooner if needed.   Eliezer Bottom, NP 9/21/20171:19 PM

## 2016-08-03 ENCOUNTER — Encounter: Payer: Self-pay | Admitting: *Deleted

## 2016-08-03 LAB — IRON AND TIBC
%SAT: 35 % (ref 21–57)
IRON: 110 ug/dL (ref 41–142)
TIBC: 311 ug/dL (ref 236–444)
UIBC: 202 ug/dL (ref 120–384)

## 2016-08-03 LAB — FERRITIN: FERRITIN: 71 ng/mL (ref 9–269)

## 2016-08-03 LAB — VITAMIN B12: Vitamin B12: 255 pg/mL (ref 211–946)

## 2016-08-31 ENCOUNTER — Ambulatory Visit: Payer: Managed Care, Other (non HMO) | Admitting: Obstetrics & Gynecology

## 2016-09-25 ENCOUNTER — Ambulatory Visit (INDEPENDENT_AMBULATORY_CARE_PROVIDER_SITE_OTHER): Payer: Managed Care, Other (non HMO) | Admitting: Licensed Clinical Social Worker

## 2016-09-25 DIAGNOSIS — F3341 Major depressive disorder, recurrent, in partial remission: Secondary | ICD-10-CM

## 2016-09-27 ENCOUNTER — Encounter: Payer: Self-pay | Admitting: Family

## 2016-10-08 ENCOUNTER — Other Ambulatory Visit (HOSPITAL_BASED_OUTPATIENT_CLINIC_OR_DEPARTMENT_OTHER): Payer: Managed Care, Other (non HMO)

## 2016-10-08 ENCOUNTER — Ambulatory Visit (HOSPITAL_BASED_OUTPATIENT_CLINIC_OR_DEPARTMENT_OTHER): Payer: Managed Care, Other (non HMO) | Admitting: Family

## 2016-10-08 VITALS — BP 101/62 | HR 67 | Temp 98.5°F | Resp 20 | Wt 257.1 lb

## 2016-10-08 DIAGNOSIS — D510 Vitamin B12 deficiency anemia due to intrinsic factor deficiency: Secondary | ICD-10-CM

## 2016-10-08 DIAGNOSIS — D508 Other iron deficiency anemias: Secondary | ICD-10-CM | POA: Diagnosis not present

## 2016-10-08 DIAGNOSIS — D509 Iron deficiency anemia, unspecified: Secondary | ICD-10-CM

## 2016-10-08 LAB — CBC WITH DIFFERENTIAL (CANCER CENTER ONLY)
BASO#: 0 10*3/uL (ref 0.0–0.2)
BASO%: 0.4 % (ref 0.0–2.0)
EOS ABS: 0.2 10*3/uL (ref 0.0–0.5)
EOS%: 2 % (ref 0.0–7.0)
HCT: 40.8 % (ref 34.8–46.6)
HEMOGLOBIN: 13.1 g/dL (ref 11.6–15.9)
LYMPH#: 1.8 10*3/uL (ref 0.9–3.3)
LYMPH%: 21.6 % (ref 14.0–48.0)
MCH: 28.2 pg (ref 26.0–34.0)
MCHC: 32.1 g/dL (ref 32.0–36.0)
MCV: 88 fL (ref 81–101)
MONO#: 1 10*3/uL — AB (ref 0.1–0.9)
MONO%: 11.7 % (ref 0.0–13.0)
NEUT%: 64.3 % (ref 39.6–80.0)
NEUTROS ABS: 5.4 10*3/uL (ref 1.5–6.5)
PLATELETS: 244 10*3/uL (ref 145–400)
RBC: 4.64 10*6/uL (ref 3.70–5.32)
RDW: 14 % (ref 11.1–15.7)
WBC: 8.4 10*3/uL (ref 3.9–10.0)

## 2016-10-08 NOTE — Progress Notes (Signed)
Hematology and Oncology Follow Up Visit  Sheri [name] [id number] 05-25-1976 40 y.o. 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 She is symptomatic with fatigue, dizziness and chewing ice. She last received Feraheme in August 2017.  CBC is stable, iron studies are pending.  Since her gastric bypass she has had occasional nausea with no vomiting.  Her appetite comes and goes. She is staying well hydrated. Her weight is up 7 lbs since her last visit.  No fever, cough, rash, dizziness, SOB, chest pain, palpitations, abdominal pain or changes in bowel or bladder habits.  No episodes of bleeding or bruising. No lymphadenopathy found on exam.  She has had a few episodes of numbness and tingling in her hands and feet.  No weakness, swelling or tenderness in her extremities. No c/o joint aches or pain.   Medications:    Medication List       Accurate as of 10/08/16  3:11 PM. Always use your most recent med list.          ferrous sulfate 220 (44 Fe) MG/5ML solution Take 5 mLs (220 mg total) by mouth daily.   montelukast 10 MG tablet Commonly known as:  SINGULAIR Take 1 tablet (10 mg total) by mouth at bedtime.   MULTIVITAMIN PO Take by mouth. Prenatal chewable-Target Brand   Norethindrone-Ethinyl Estradiol-Fe 0.8-25 MG-MCG tablet Commonly known as:  GENERESSE Chew 1 tablet by mouth daily.   XANAX 0.25 MG tablet Generic drug:  ALPRAZolam Take 1 tablet by mouth 3 (three) times daily as needed.   ZOLOFT 50 MG tablet Generic drug:  sertraline Take 1 tablet by mouth daily.       Allergies:  Allergies  Allergen Reactions  . Aspirin Other (See Comments)    Pt. Has had gastric bypass surgery.  ASA causes ulcers.  Can take Aleve.    Past Medical History,  Surgical history, Social history, and Family History were reviewed and updated.  Review of Systems: All other 10 point review of systems is negative.   Physical Exam:  weight is 257 lb 1.9 oz (116.6 kg). Her oral temperature is 98.5 F (36.9 C). Her blood pressure is 101/62 and her pulse is 67. Her respiration is 20 and oxygen saturation is 98%.   Wt Readings from Last 3 Encounters:  10/08/16 257 lb 1.9 oz (116.6 kg)  08/02/16 250 lb (113.4 kg)  06/20/16 245 lb (111.1 kg)    Ocular: Sclerae unicteric, pupils equal, round and reactive to light Ear-nose-throat: Oropharynx clear, dentition fair Lymphatic: No cervical supraclavicular or axillary adenopathy Lungs no rales or rhonchi, good excursion bilaterally Heart regular rate and rhythm, no murmur appreciated Abd soft, nontender, positive bowel sounds, no liver or spleen tip palpated on exam, no fluid wave  MSK no focal spinal tenderness, no joint edema Neuro: non-focal, well-oriented, appropriate affect Breasts: Deferred  Lab Results  Component Value Date   WBC 8.4 10/08/2016   HGB 13.1 10/08/2016   HCT 40.8 10/08/2016   MCV 88 10/08/2016   PLT 244 10/08/2016   Lab Results  Component Value Date   FERRITIN 71 08/02/2016   IRON 110 08/02/2016   TIBC 311 08/02/2016   UIBC 202 08/02/2016   IRONPCTSAT 35 08/02/2016   Lab Results  Component Value Date  RBC 4.64 10/08/2016   No results found for: KPAFRELGTCHN, LAMBDASER, KAPLAMBRATIO No results found for: IGGSERUM, IGA, IGMSERUM No results found for: Odetta Pink, SPEI   Chemistry      Component Value Date/Time   NA 138 06/20/2016 1303   K 4.0 06/20/2016 1303   CL 105 06/04/2016 1647   CO2 23 06/20/2016 1303   BUN 12.6 06/20/2016 1303   CREATININE 0.8 06/20/2016 1303      Component Value Date/Time   CALCIUM 9.3 06/20/2016 1303   ALKPHOS 104 06/20/2016 1303   AST 30 06/20/2016 1303   ALT 37 06/20/2016 1303    BILITOT 0.47 06/20/2016 1303     Impression and Plan: Sheri [name] is a very pleasant 40 yo African American female with history of pernicious and iron deficiency anemia. She has history of gastric bypass in 2001 and malabsorption. Her last dose of Feraheme was 3 months ago in August. She is symptomatic with fatigue, dizziness and chewing ice.  Hgb is stable at 13.1 with an MCV of 88.  We will see what her iron studies show and bring her in next week for an infusion if needed.  We will plan to see her back 3 months for repeat lab work and follow-up. She will contact our office with any questions or concerns. We can certainly see her sooner if needed.   Eliezer Bottom, NP 11/27/20173:11 PM

## 2016-10-09 LAB — IRON AND TIBC
%SAT: 36 % (ref 21–57)
IRON: 117 ug/dL (ref 41–142)
TIBC: 325 ug/dL (ref 236–444)
UIBC: 208 ug/dL (ref 120–384)

## 2016-10-09 LAB — VITAMIN B12: Vitamin B12: 236 pg/mL (ref 211–946)

## 2016-10-09 LAB — FERRITIN: FERRITIN: 57 ng/mL (ref 9–269)

## 2016-10-11 ENCOUNTER — Other Ambulatory Visit: Payer: Self-pay

## 2016-10-11 ENCOUNTER — Ambulatory Visit: Payer: Self-pay | Admitting: Family

## 2016-11-20 ENCOUNTER — Ambulatory Visit (INDEPENDENT_AMBULATORY_CARE_PROVIDER_SITE_OTHER): Payer: Managed Care, Other (non HMO) | Admitting: Licensed Clinical Social Worker

## 2016-11-20 DIAGNOSIS — F3341 Major depressive disorder, recurrent, in partial remission: Secondary | ICD-10-CM

## 2017-01-08 ENCOUNTER — Ambulatory Visit (HOSPITAL_BASED_OUTPATIENT_CLINIC_OR_DEPARTMENT_OTHER): Payer: 59 | Admitting: Family

## 2017-01-08 ENCOUNTER — Other Ambulatory Visit (HOSPITAL_BASED_OUTPATIENT_CLINIC_OR_DEPARTMENT_OTHER): Payer: 59

## 2017-01-08 VITALS — BP 117/61 | HR 61 | Temp 98.8°F | Wt 264.0 lb

## 2017-01-08 DIAGNOSIS — D509 Iron deficiency anemia, unspecified: Secondary | ICD-10-CM | POA: Diagnosis not present

## 2017-01-08 DIAGNOSIS — D508 Other iron deficiency anemias: Secondary | ICD-10-CM

## 2017-01-08 DIAGNOSIS — D510 Vitamin B12 deficiency anemia due to intrinsic factor deficiency: Secondary | ICD-10-CM | POA: Diagnosis not present

## 2017-01-08 LAB — CBC WITH DIFFERENTIAL (CANCER CENTER ONLY)
BASO#: 0.1 10*3/uL (ref 0.0–0.2)
BASO%: 0.9 % (ref 0.0–2.0)
EOS%: 3.3 % (ref 0.0–7.0)
Eosinophils Absolute: 0.2 10*3/uL (ref 0.0–0.5)
HCT: 40.6 % (ref 34.8–46.6)
HEMOGLOBIN: 13.3 g/dL (ref 11.6–15.9)
LYMPH#: 1.8 10*3/uL (ref 0.9–3.3)
LYMPH%: 31.9 % (ref 14.0–48.0)
MCH: 29 pg (ref 26.0–34.0)
MCHC: 32.8 g/dL (ref 32.0–36.0)
MCV: 89 fL (ref 81–101)
MONO#: 0.7 10*3/uL (ref 0.1–0.9)
MONO%: 12.6 % (ref 0.0–13.0)
NEUT%: 51.3 % (ref 39.6–80.0)
NEUTROS ABS: 2.9 10*3/uL (ref 1.5–6.5)
Platelets: 244 10*3/uL (ref 145–400)
RBC: 4.58 10*6/uL (ref 3.70–5.32)
RDW: 14 % (ref 11.1–15.7)
WBC: 5.7 10*3/uL (ref 3.9–10.0)

## 2017-01-08 LAB — IRON AND TIBC
%SAT: 23 % (ref 21–57)
Iron: 75 ug/dL (ref 41–142)
TIBC: 323 ug/dL (ref 236–444)
UIBC: 248 ug/dL (ref 120–384)

## 2017-01-08 LAB — FERRITIN: FERRITIN: 51 ng/mL (ref 9–269)

## 2017-01-08 NOTE — Progress Notes (Signed)
Hematology and Oncology Follow Up Visit  Sheri [name] [id number] 05-Jan-1976 41 y.o. 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 She is doing well and states that she feels much better. She still has mild intermittent fatigued at time but states that she is chewing less ice. Hgb is stable at 13.3 with an MCV of 89. Iron studies are pending.  She no longer has a cycle.  No fever, cough, rash, dizziness, SOB, chest pain, palpitations, abdominal pain or changes in bowel or bladder habits.  She has allergies and has had some sinus drainage lately.  No episodes of bleeding or bruising. No lymphadenopathy found on exam.  No swelling, tenderness, numbness or tingling in her extremities. She has some generalized aches and pains with the changes in weather and temperature we have been having.   Medications:  Allergies as of 01/08/2017      Reactions   Aspirin Other (See Comments)   Pt. Has had gastric bypass surgery.  ASA causes ulcers.  Can take Aleve.      Medication List       Accurate as of 01/08/17  9:01 AM. Always use your most recent med list.          ferrous sulfate 220 (44 Fe) MG/5ML solution Take 5 mLs (220 mg total) by mouth daily.   montelukast 10 MG tablet Commonly known as:  SINGULAIR Take 1 tablet (10 mg total) by mouth at bedtime.   MULTIVITAMIN PO Take by mouth. Prenatal chewable-Target Brand   Norethindrone-Ethinyl Estradiol-Fe 0.8-25 MG-MCG tablet Commonly known as:  GENERESSE Chew 1 tablet by mouth daily.   XANAX 0.25 MG tablet Generic drug:  ALPRAZolam Take 1 tablet by mouth 3 (three) times daily as needed.   ZOLOFT 50 MG tablet Generic drug:  sertraline Take 1 tablet by mouth daily.       Allergies:  Allergies  Allergen Reactions  .  Aspirin Other (See Comments)    Pt. Has had gastric bypass surgery.  ASA causes ulcers.  Can take Aleve.    Past Medical History, Surgical history, Social history, and Family History were reviewed and updated.  Review of Systems: All other 10 point review of systems is negative.   Physical Exam:  weight is 264 lb (119.7 kg). Her oral temperature is 98.8 F (37.1 C). Her blood pressure is 117/61 and her pulse is 61.   Wt Readings from Last 3 Encounters:  01/08/17 264 lb (119.7 kg)  10/08/16 257 lb 1.9 oz (116.6 kg)  08/02/16 250 lb (113.4 kg)    Ocular: Sclerae unicteric, pupils equal, round and reactive to light Ear-nose-throat: Oropharynx clear, dentition fair Lymphatic: No cervical supraclavicular or axillary adenopathy Lungs no rales or rhonchi, good excursion bilaterally Heart regular rate and rhythm, no murmur appreciated Abd soft, nontender, positive bowel sounds, no liver or spleen tip palpated on exam, no fluid wave  MSK no focal spinal tenderness, no joint edema Neuro: non-focal, well-oriented, appropriate affect Breasts: Deferred  Lab Results  Component Value Date   WBC 5.7 01/08/2017   HGB 13.3 01/08/2017   HCT 40.6 01/08/2017   MCV 89 01/08/2017   PLT 244 01/08/2017   Lab Results  Component Value Date   FERRITIN 57 10/08/2016   IRON 117 10/08/2016  TIBC 325 10/08/2016   UIBC 208 10/08/2016   IRONPCTSAT 36 10/08/2016   Lab Results  Component Value Date   RBC 4.58 01/08/2017   No results found for: KPAFRELGTCHN, LAMBDASER, KAPLAMBRATIO No results found for: IGGSERUM, IGA, IGMSERUM No results found for: Odetta Pink, SPEI   Chemistry      Component Value Date/Time   NA 138 06/20/2016 1303   K 4.0 06/20/2016 1303   CL 105 06/04/2016 1647   CO2 23 06/20/2016 1303   BUN 12.6 06/20/2016 1303   CREATININE 0.8 06/20/2016 1303      Component Value Date/Time   CALCIUM 9.3 06/20/2016 1303   ALKPHOS  104 06/20/2016 1303   AST 30 06/20/2016 1303   ALT 37 06/20/2016 1303   BILITOT 0.47 06/20/2016 1303     Impression and Plan: Sheri [name] is a very pleasant 41 yo African American female with history of pernicious and iron deficiency anemia. She has history of gastric bypass in 2001 and malabsorption. She last received IV iron in August 2017 and has responded nicely. She is symptomatic at times with mild intermittent fatigue but chewing less ice.  Hgb is stable at 13.3 with an MCV of 89.  We will see what her iron studies show and bring her in next week for an infusion if needed.  We will go ahead and plan to see her back 3 months for repeat lab work and follow-up. She will contact our office with any questions or concerns. We can certainly see her sooner if needed.   Eliezer Bottom, NP 2/27/20189:01 AM

## 2017-01-09 ENCOUNTER — Encounter: Payer: Self-pay | Admitting: *Deleted

## 2017-02-12 ENCOUNTER — Ambulatory Visit (INDEPENDENT_AMBULATORY_CARE_PROVIDER_SITE_OTHER): Payer: 59 | Admitting: Licensed Clinical Social Worker

## 2017-02-12 DIAGNOSIS — F3340 Major depressive disorder, recurrent, in remission, unspecified: Secondary | ICD-10-CM | POA: Diagnosis not present

## 2017-04-09 ENCOUNTER — Ambulatory Visit (HOSPITAL_BASED_OUTPATIENT_CLINIC_OR_DEPARTMENT_OTHER): Payer: 59 | Admitting: Family

## 2017-04-09 ENCOUNTER — Other Ambulatory Visit (HOSPITAL_BASED_OUTPATIENT_CLINIC_OR_DEPARTMENT_OTHER): Payer: 59

## 2017-04-09 VITALS — BP 122/58 | HR 64 | Temp 98.7°F | Resp 16 | Wt 265.8 lb

## 2017-04-09 DIAGNOSIS — D508 Other iron deficiency anemias: Secondary | ICD-10-CM

## 2017-04-09 DIAGNOSIS — D510 Vitamin B12 deficiency anemia due to intrinsic factor deficiency: Secondary | ICD-10-CM

## 2017-04-09 DIAGNOSIS — Z9884 Bariatric surgery status: Secondary | ICD-10-CM | POA: Diagnosis not present

## 2017-04-09 DIAGNOSIS — K909 Intestinal malabsorption, unspecified: Secondary | ICD-10-CM

## 2017-04-09 DIAGNOSIS — D509 Iron deficiency anemia, unspecified: Secondary | ICD-10-CM | POA: Diagnosis not present

## 2017-04-09 LAB — CBC WITH DIFFERENTIAL (CANCER CENTER ONLY)
BASO#: 0 10*3/uL (ref 0.0–0.2)
BASO%: 0.5 % (ref 0.0–2.0)
EOS%: 1.8 % (ref 0.0–7.0)
Eosinophils Absolute: 0.1 10*3/uL (ref 0.0–0.5)
HEMATOCRIT: 40.2 % (ref 34.8–46.6)
HEMOGLOBIN: 13.2 g/dL (ref 11.6–15.9)
LYMPH#: 1.8 10*3/uL (ref 0.9–3.3)
LYMPH%: 29.5 % (ref 14.0–48.0)
MCH: 28.5 pg (ref 26.0–34.0)
MCHC: 32.8 g/dL (ref 32.0–36.0)
MCV: 87 fL (ref 81–101)
MONO#: 0.7 10*3/uL (ref 0.1–0.9)
MONO%: 10.9 % (ref 0.0–13.0)
NEUT#: 3.4 10*3/uL (ref 1.5–6.5)
NEUT%: 57.3 % (ref 39.6–80.0)
Platelets: 256 10*3/uL (ref 145–400)
RBC: 4.63 10*6/uL (ref 3.70–5.32)
RDW: 14 % (ref 11.1–15.7)
WBC: 6 10*3/uL (ref 3.9–10.0)

## 2017-04-09 LAB — COMPREHENSIVE METABOLIC PANEL
ALBUMIN: 3.2 g/dL — AB (ref 3.5–5.0)
ALT: 23 U/L (ref 0–55)
AST: 24 U/L (ref 5–34)
Alkaline Phosphatase: 90 U/L (ref 40–150)
Anion Gap: 7 mEq/L (ref 3–11)
BILIRUBIN TOTAL: 0.35 mg/dL (ref 0.20–1.20)
BUN: 12.2 mg/dL (ref 7.0–26.0)
CALCIUM: 9 mg/dL (ref 8.4–10.4)
CHLORIDE: 111 meq/L — AB (ref 98–109)
CO2: 21 mEq/L — ABNORMAL LOW (ref 22–29)
Creatinine: 0.8 mg/dL (ref 0.6–1.1)
EGFR: >90 mL/min/{1.73_m2} (ref >90)
Glucose: 84 mg/dl (ref 70–140)
Potassium: 3.9 mEq/L (ref 3.5–5.1)
Sodium: 139 mEq/L (ref 136–145)
TOTAL PROTEIN: 7.2 g/dL (ref 6.4–8.3)

## 2017-04-09 LAB — IRON AND TIBC
%SAT: 28 % (ref 21–57)
IRON: 95 ug/dL (ref 41–142)
TIBC: 343 ug/dL (ref 236–444)
UIBC: 248 ug/dL (ref 120–384)

## 2017-04-09 LAB — FERRITIN: Ferritin: 47 ng/ml (ref 9–269)

## 2017-04-09 NOTE — Progress Notes (Signed)
Hematology and Oncology Follow Up Visit  Sheri [name] [id number] Jul 20, 1976 41 y.o. 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 She is having some mild fatigue and has started chewing ice a little more frequently. Her last iron infusion was in August of last year. Iron saturation in November was 36% with a ferritin of 57.  No fever, chills, n/v, cough, rash, dizziness, SOB, chest pain, palpitations, abdominal pain or changes in bowel or bladder habits.  No swelling, tenderness, numbness or tingling in his extremities. No c/o pain at this time.  Her appetite comes and goes and she is staying hydrated. Her weight is stable.  She is exercising several times a week and staying busy at work.   ECOG Performance Status: 1 - Symptomatic but completely ambulatory  Medications:  Allergies as of 04/09/2017      Reactions   Aspirin Other (See Comments)   Pt. Has had gastric bypass surgery.  ASA causes ulcers.  Can take Aleve.      Medication List       Accurate as of 04/09/17  8:48 AM. Always use your most recent med list.          ferrous sulfate 220 (44 Fe) MG/5ML solution Take 5 mLs (220 mg total) by mouth daily.   montelukast 10 MG tablet Commonly known as:  SINGULAIR Take 1 tablet (10 mg total) by mouth at bedtime.   MULTIVITAMIN PO Take by mouth. Prenatal chewable-Target Brand   Norethindrone-Ethinyl Estradiol-Fe 0.8-25 MG-MCG tablet Commonly known as:  GENERESSE Chew 1 tablet by mouth daily.       Allergies:  Allergies  Allergen Reactions  . Aspirin Other (See Comments)    Pt. Has had gastric bypass surgery.  ASA causes ulcers.  Can take Aleve.    Past Medical History, Surgical history, Social history, and Family History were reviewed and  updated.  Review of Systems: All other 10 point review of systems is negative.   Physical Exam:  weight is 265 lb 12.8 oz (120.6 kg). Her oral temperature is 98.7 F (37.1 C). Her blood pressure is 122/58 (abnormal) and her pulse is 64. Her respiration is 16 and oxygen saturation is 99%.   Wt Readings from Last 3 Encounters:  04/09/17 265 lb 12.8 oz (120.6 kg)  01/08/17 264 lb (119.7 kg)  10/08/16 257 lb 1.9 oz (116.6 kg)    Ocular: Sclerae unicteric, pupils equal, round and reactive to light Ear-nose-throat: Oropharynx clear, dentition fair Lymphatic: No cervical, supraclavicular or axillary adenopathy Lungs no rales or rhonchi, good excursion bilaterally Heart regular rate and rhythm, no murmur appreciated Abd soft, nontender, positive bowel sounds, no liver or spleen tip palpated on exam, no fluid wave MSK no focal spinal tenderness, no joint edema Neuro: non-focal, well-oriented, appropriate affect Breasts: Deferred   Lab Results  Component Value Date   WBC 6.0 04/09/2017   HGB 13.2 04/09/2017   HCT 40.2 04/09/2017   MCV 87 04/09/2017   PLT 256 04/09/2017   Lab Results  Component Value Date   FERRITIN 51 01/08/2017   IRON 75 01/08/2017   TIBC 323 01/08/2017   UIBC 248 01/08/2017   IRONPCTSAT 23 01/08/2017   Lab Results  Component Value Date   RBC 4.63 04/09/2017   No  results found for: KPAFRELGTCHN, LAMBDASER, KAPLAMBRATIO No results found for: IGGSERUM, IGA, IGMSERUM No results found for: Odetta Pink, SPEI   Chemistry      Component Value Date/Time   NA 138 06/20/2016 1303   K 4.0 06/20/2016 1303   CL 105 06/04/2016 1647   CO2 23 06/20/2016 1303   BUN 12.6 06/20/2016 1303   CREATININE 0.8 06/20/2016 1303      Component Value Date/Time   CALCIUM 9.3 06/20/2016 1303   ALKPHOS 104 06/20/2016 1303   AST 30 06/20/2016 1303   ALT 37 06/20/2016 1303   BILITOT 0.47 06/20/2016 1303      Impression  and Plan: Sheri [name] is a very pleasant 41 yo [place] female with both pernicious and iron deficiency anemia secondary to malabsorption after gastric bypass in 2001.  She responded nicely to the IV iron she received in August 2017. She is here today with c/o mild intermittent fatigue and chewing ice.  We will see what her iron studies show and bring her back in later this week for an infusion if needed.  We will go ahead and plan to see her back in 3 months for repeat lab work and follow-up.  She will contact our office with any questions or concerns. We can certainly see her sooner if need be.   Eliezer Bottom, NP 5/29/20188:48 AM

## 2017-04-10 ENCOUNTER — Telehealth: Payer: Self-pay | Admitting: *Deleted

## 2017-04-10 LAB — VITAMIN B12: VITAMIN B 12: 297 pg/mL (ref 232–1245)

## 2017-04-10 NOTE — Telephone Encounter (Addendum)
Patient is aware of results.   ----- Message from Eliezer Bottom, NP sent at 04/09/2017  2:07 PM EDT ----- Regarding: Iron  Iron looks good. No infusion needed!!! Thank you   [name]  ----- Message ----- From: Interface, Lab In Three Zero One Sent: 04/09/2017   8:37 AM To: Eliezer Bottom, NP

## 2017-07-10 ENCOUNTER — Other Ambulatory Visit (HOSPITAL_BASED_OUTPATIENT_CLINIC_OR_DEPARTMENT_OTHER): Payer: 59

## 2017-07-10 ENCOUNTER — Ambulatory Visit (HOSPITAL_BASED_OUTPATIENT_CLINIC_OR_DEPARTMENT_OTHER): Payer: 59 | Admitting: Family

## 2017-07-10 VITALS — BP 115/66 | HR 63 | Temp 98.6°F | Resp 18 | Wt 269.0 lb

## 2017-07-10 DIAGNOSIS — K909 Intestinal malabsorption, unspecified: Secondary | ICD-10-CM | POA: Diagnosis not present

## 2017-07-10 DIAGNOSIS — D508 Other iron deficiency anemias: Secondary | ICD-10-CM

## 2017-07-10 DIAGNOSIS — Z9884 Bariatric surgery status: Secondary | ICD-10-CM | POA: Diagnosis not present

## 2017-07-10 DIAGNOSIS — D510 Vitamin B12 deficiency anemia due to intrinsic factor deficiency: Secondary | ICD-10-CM

## 2017-07-10 LAB — CBC WITH DIFFERENTIAL (CANCER CENTER ONLY)
BASO#: 0 10*3/uL (ref 0.0–0.2)
BASO%: 0.4 % (ref 0.0–2.0)
EOS%: 1.5 % (ref 0.0–7.0)
Eosinophils Absolute: 0.1 10*3/uL (ref 0.0–0.5)
HEMATOCRIT: 40.6 % (ref 34.8–46.6)
HGB: 13.1 g/dL (ref 11.6–15.9)
LYMPH#: 1.5 10*3/uL (ref 0.9–3.3)
LYMPH%: 27.5 % (ref 14.0–48.0)
MCH: 28.7 pg (ref 26.0–34.0)
MCHC: 32.3 g/dL (ref 32.0–36.0)
MCV: 89 fL (ref 81–101)
MONO#: 0.6 10*3/uL (ref 0.1–0.9)
MONO%: 10.3 % (ref 0.0–13.0)
NEUT#: 3.3 10*3/uL (ref 1.5–6.5)
NEUT%: 60.3 % (ref 39.6–80.0)
PLATELETS: 262 10*3/uL (ref 145–400)
RBC: 4.56 10*6/uL (ref 3.70–5.32)
RDW: 14.9 % (ref 11.1–15.7)
WBC: 5.5 10*3/uL (ref 3.9–10.0)

## 2017-07-10 LAB — COMPREHENSIVE METABOLIC PANEL
ALBUMIN: 3.2 g/dL — AB (ref 3.5–5.0)
ALT: 22 U/L (ref 0–55)
ANION GAP: 6 meq/L (ref 3–11)
AST: 22 U/L (ref 5–34)
Alkaline Phosphatase: 127 U/L (ref 40–150)
BUN: 9 mg/dL (ref 7.0–26.0)
CALCIUM: 9.2 mg/dL (ref 8.4–10.4)
CHLORIDE: 111 meq/L — AB (ref 98–109)
CO2: 23 mEq/L (ref 22–29)
Creatinine: 0.8 mg/dL (ref 0.6–1.1)
EGFR: >90 mL/min/{1.73_m2} (ref >90)
Glucose: 80 mg/dl (ref 70–140)
POTASSIUM: 3.6 meq/L (ref 3.5–5.1)
Sodium: 140 mEq/L (ref 136–145)
Total Bilirubin: 0.34 mg/dL (ref 0.20–1.20)
Total Protein: 7.6 g/dL (ref 6.4–8.3)

## 2017-07-10 LAB — FERRITIN: FERRITIN: 37 ng/mL (ref 9–269)

## 2017-07-10 LAB — IRON AND TIBC
%SAT: 29 % (ref 21–57)
IRON: 101 ug/dL (ref 41–142)
TIBC: 346 ug/dL (ref 236–444)
UIBC: 245 ug/dL (ref 120–384)

## 2017-07-10 NOTE — Progress Notes (Signed)
Hematology and Oncology Follow Up Visit  Sheri [name] [id number] [birth year] 41 y.o. 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 She is symptomatic with fatigue, chewing ice and SOB with over exertion. She has occasional nausea, no vomiting.  She is on an oral contraceptive and does not have a cycle.  She denies any episodes of bleeding, bruising or petechiae.  No fever, chills, cough, rash, dizziness, chest pain, palpitations, abdominal pain or changes in bowel or bladder habits.  No swelling, tenderness, numbness or tingling in her extremities. No c/o pain.  She states that her appetite is ok and she is staying hydrated. Her weight is stable.  She states that she is ow off her zoloft and feeling a little better. She denies feeling depressed and is hoping she will now ben able to lose some weight.   ECOG Performance Status: 1 - Symptomatic but completely ambulatory  Medications:  Allergies as of 07/10/2017      Reactions   Aspirin Other (See Comments)   Pt. Has had gastric bypass surgery.  ASA causes ulcers.  Can take Aleve.      Medication List       Accurate as of 07/10/17  9:09 AM. Always use your most recent med list.          ALPRAZolam 0.5 MG tablet Commonly known as:  XANAX Take by mouth.   ferrous sulfate 220 (44 Fe) MG/5ML solution Take 5 mLs (220 mg total) by mouth daily.   montelukast 10 MG tablet Commonly known as:  SINGULAIR Take 1 tablet (10 mg total) by mouth at bedtime.   MULTIVITAMIN PO Take by mouth. Prenatal chewable-Target Brand   Norethindrone-Ethinyl Estradiol-Fe 0.8-25 MG-MCG tablet Commonly known as:  GENERESSE Chew 1 tablet by mouth daily.   sertraline 50 MG tablet Commonly known as:  ZOLOFT       Allergies:  Allergies    Allergen Reactions  . Aspirin Other (See Comments)    Pt. Has had gastric bypass surgery.  ASA causes ulcers.  Can take Aleve.    Past Medical History, Surgical history, Social history, and Family History were reviewed and updated.  Review of Systems: All other 10 point review of systems is negative.   Physical Exam:  weight is 269 lb (122 kg). Her oral temperature is 98.6 F (37 C). Her blood pressure is 115/66 and her pulse is 63. Her respiration is 18 and oxygen saturation is 98%.   Wt Readings from Last 3 Encounters:  07/10/17 269 lb (122 kg)  04/09/17 265 lb 12.8 oz (120.6 kg)  01/08/17 264 lb (119.7 kg)    Ocular: Sclerae unicteric, pupils equal, round and reactive to light Ear-nose-throat: Oropharynx clear, dentition fair Lymphatic: No cervical, supraclavicular or axillary adenopathy Lungs no rales or rhonchi, good excursion bilaterally Heart regular rate and rhythm, no murmur appreciated Abd soft, nontender, positive bowel sounds, no liver or spleen tip palpated on exam, no fluid wave  MSK no focal spinal tenderness, no joint edema Neuro: non-focal, well-oriented, appropriate affect Breasts: Deferred   Lab Results  Component Value Date   WBC 5.5 07/10/2017   HGB 13.1 07/10/2017   HCT 40.6 07/10/2017   MCV 89 07/10/2017   PLT 262 07/10/2017   Lab Results  Component Value Date  FERRITIN 47 04/09/2017   IRON 95 04/09/2017   TIBC 343 04/09/2017   UIBC 248 04/09/2017   IRONPCTSAT 28 04/09/2017   Lab Results  Component Value Date   RBC 4.56 07/10/2017   No results found for: KPAFRELGTCHN, LAMBDASER, KAPLAMBRATIO No results found for: IGGSERUM, IGA, IGMSERUM No results found for: Sheri [name], SPEI   Chemistry      Component Value Date/Time   NA 139 04/09/2017 0823   K 3.9 04/09/2017 0823   CL 105 06/04/2016 1647   CO2 21 (L) 04/09/2017 0823   BUN 12.2 04/09/2017 0823   CREATININE 0.8 04/09/2017  0823      Component Value Date/Time   CALCIUM 9.0 04/09/2017 0823   ALKPHOS 90 04/09/2017 0823   AST 24 04/09/2017 0823   ALT 23 04/09/2017 0823   BILITOT 0.35 04/09/2017 0823      Impression and Plan: Sheri [name] is a very pleasant African American female with history of gastric bypass with iron deficiency anemia due to malabsorption. She is symptomatic at this time with fatigue, chewing ice and SOB with exertion.  She has not had IV iron in over a year. We will see what her iron studies show and bring her back in later this week for an infusion if needed.  We will go ahead and plan to see her back again in another 3 months for repeat lab work and follow-up.   She will contact our office with any questions or concerns. We can certainly see her sooner if need be.   Sheri Bottom, NP 8/29/20189:09 AM

## 2017-07-11 LAB — VITAMIN B12: VITAMIN B 12: 229 pg/mL — AB (ref 232–1245)

## 2017-07-16 ENCOUNTER — Other Ambulatory Visit: Payer: Self-pay | Admitting: Obstetrics & Gynecology

## 2017-07-16 NOTE — Telephone Encounter (Signed)
Medication refill request: OCP Last AEX:  06-04-16  Next AEX: 08-27-17  Last MMG (if hormonal medication request): 09-20-14 WNL  Refill authorized: please advise

## 2017-07-17 ENCOUNTER — Telehealth: Payer: Self-pay | Admitting: *Deleted

## 2017-07-17 NOTE — Telephone Encounter (Signed)
Patient low B12 level.  Dr. Marin Olp notified. Patient needs monthly B12 injections.  Made appointment for tomorrow to start.

## 2017-07-18 ENCOUNTER — Ambulatory Visit: Payer: Self-pay

## 2017-07-18 ENCOUNTER — Ambulatory Visit (HOSPITAL_BASED_OUTPATIENT_CLINIC_OR_DEPARTMENT_OTHER): Payer: 59

## 2017-07-18 VITALS — BP 111/69 | HR 57 | Temp 98.1°F | Resp 16

## 2017-07-18 DIAGNOSIS — D508 Other iron deficiency anemias: Secondary | ICD-10-CM

## 2017-07-18 DIAGNOSIS — D510 Vitamin B12 deficiency anemia due to intrinsic factor deficiency: Secondary | ICD-10-CM | POA: Diagnosis not present

## 2017-07-18 MED ORDER — CYANOCOBALAMIN 1000 MCG/ML IJ SOLN
INTRAMUSCULAR | Status: AC
  Filled 2017-07-18: qty 1

## 2017-07-18 MED ORDER — CYANOCOBALAMIN 1000 MCG/ML IJ SOLN
1000.0000 ug | Freq: Once | INTRAMUSCULAR | Status: AC
  Administered 2017-07-18: 1000 ug via INTRAMUSCULAR

## 2017-07-18 NOTE — Patient Instructions (Signed)
Cyanocobalamin, Vitamin B12 injection What is this medicine? CYANOCOBALAMIN (sye an oh koe BAL a min) is a man made form of vitamin B12. Vitamin B12 is used in the growth of healthy blood cells, nerve cells, and proteins in the body. It also helps with the metabolism of fats and carbohydrates. This medicine is used to treat people who can not absorb vitamin B12. This medicine may be used for other purposes; ask your health care provider or pharmacist if you have questions. COMMON BRAND NAME(S): B-12 Compliance Kit, B-12 Injection Kit, Cyomin, LA-12, Nutri-Twelve, Physicians EZ Use B-12, Primabalt What should I tell my health care provider before I take this medicine? They need to know if you have any of these conditions: -kidney disease -Leber's disease -megaloblastic anemia -an unusual or allergic reaction to cyanocobalamin, cobalt, other medicines, foods, dyes, or preservatives -pregnant or trying to get pregnant -breast-feeding How should I use this medicine? This medicine is injected into a muscle or deeply under the skin. It is usually given by a health care professional in a clinic or doctor's office. However, your doctor may teach you how to inject yourself. Follow all instructions. Talk to your pediatrician regarding the use of this medicine in children. Special care may be needed. Overdosage: If you think you have taken too much of this medicine contact a poison control center or emergency room at once. NOTE: This medicine is only for you. Do not share this medicine with others. What if I miss a dose? If you are given your dose at a clinic or doctor's office, call to reschedule your appointment. If you give your own injections and you miss a dose, take it as soon as you can. If it is almost time for your next dose, take only that dose. Do not take double or extra doses. What may interact with this medicine? -colchicine -heavy alcohol intake This list may not describe all possible  interactions. Give your health care provider a list of all the medicines, herbs, non-prescription drugs, or dietary supplements you use. Also tell them if you smoke, drink alcohol, or use illegal drugs. Some items may interact with your medicine. What should I watch for while using this medicine? Visit your doctor or health care professional regularly. You may need blood work done while you are taking this medicine. You may need to follow a special diet. Talk to your doctor. Limit your alcohol intake and avoid smoking to get the best benefit. What side effects may I notice from receiving this medicine? Side effects that you should report to your doctor or health care professional as soon as possible: -allergic reactions like skin rash, itching or hives, swelling of the face, lips, or tongue -blue tint to skin -chest tightness, pain -difficulty breathing, wheezing -dizziness -red, swollen painful area on the leg Side effects that usually do not require medical attention (report to your doctor or health care professional if they continue or are bothersome): -diarrhea -headache This list may not describe all possible side effects. Call your doctor for medical advice about side effects. You may report side effects to FDA at 1-800-FDA-1088. Where should I keep my medicine? Keep out of the reach of children. Store at room temperature between 15 and 30 degrees C (59 and 85 degrees F). Protect from light. Throw away any unused medicine after the expiration date. NOTE: This sheet is a summary. It may not cover all possible information. If you have questions about this medicine, talk to your doctor, pharmacist, or   health care provider.  2018 Elsevier/Gold Standard (2008-02-09 22:10:20)  

## 2017-08-15 ENCOUNTER — Ambulatory Visit (HOSPITAL_BASED_OUTPATIENT_CLINIC_OR_DEPARTMENT_OTHER): Payer: 59

## 2017-08-15 VITALS — BP 119/50 | HR 62 | Temp 98.7°F | Resp 18

## 2017-08-15 DIAGNOSIS — D510 Vitamin B12 deficiency anemia due to intrinsic factor deficiency: Secondary | ICD-10-CM

## 2017-08-15 MED ORDER — CYANOCOBALAMIN 1000 MCG/ML IJ SOLN
INTRAMUSCULAR | Status: AC
  Filled 2017-08-15: qty 1

## 2017-08-15 MED ORDER — CYANOCOBALAMIN 1000 MCG/ML IJ SOLN
1000.0000 ug | Freq: Once | INTRAMUSCULAR | Status: AC
  Administered 2017-08-15: 1000 ug via INTRAMUSCULAR

## 2017-08-15 NOTE — Patient Instructions (Signed)
Cyanocobalamin, Vitamin B12 injection What is this medicine? CYANOCOBALAMIN (sye an oh koe BAL a min) is a man made form of vitamin B12. Vitamin B12 is used in the growth of healthy blood cells, nerve cells, and proteins in the body. It also helps with the metabolism of fats and carbohydrates. This medicine is used to treat people who can not absorb vitamin B12. This medicine may be used for other purposes; ask your health care provider or pharmacist if you have questions. COMMON BRAND NAME(S): B-12 Compliance Kit, B-12 Injection Kit, Cyomin, LA-12, Nutri-Twelve, Physicians EZ Use B-12, Primabalt What should I tell my health care provider before I take this medicine? They need to know if you have any of these conditions: -kidney disease -Leber's disease -megaloblastic anemia -an unusual or allergic reaction to cyanocobalamin, cobalt, other medicines, foods, dyes, or preservatives -pregnant or trying to get pregnant -breast-feeding How should I use this medicine? This medicine is injected into a muscle or deeply under the skin. It is usually given by a health care professional in a clinic or doctor's office. However, your doctor may teach you how to inject yourself. Follow all instructions. Talk to your pediatrician regarding the use of this medicine in children. Special care may be needed. Overdosage: If you think you have taken too much of this medicine contact a poison control center or emergency room at once. NOTE: This medicine is only for you. Do not share this medicine with others. What if I miss a dose? If you are given your dose at a clinic or doctor's office, call to reschedule your appointment. If you give your own injections and you miss a dose, take it as soon as you can. If it is almost time for your next dose, take only that dose. Do not take double or extra doses. What may interact with this medicine? -colchicine -heavy alcohol intake This list may not describe all possible  interactions. Give your health care provider a list of all the medicines, herbs, non-prescription drugs, or dietary supplements you use. Also tell them if you smoke, drink alcohol, or use illegal drugs. Some items may interact with your medicine. What should I watch for while using this medicine? Visit your doctor or health care professional regularly. You may need blood work done while you are taking this medicine. You may need to follow a special diet. Talk to your doctor. Limit your alcohol intake and avoid smoking to get the best benefit. What side effects may I notice from receiving this medicine? Side effects that you should report to your doctor or health care professional as soon as possible: -allergic reactions like skin rash, itching or hives, swelling of the face, lips, or tongue -blue tint to skin -chest tightness, pain -difficulty breathing, wheezing -dizziness -red, swollen painful area on the leg Side effects that usually do not require medical attention (report to your doctor or health care professional if they continue or are bothersome): -diarrhea -headache This list may not describe all possible side effects. Call your doctor for medical advice about side effects. You may report side effects to FDA at 1-800-FDA-1088. Where should I keep my medicine? Keep out of the reach of children. Store at room temperature between 15 and 30 degrees C (59 and 85 degrees F). Protect from light. Throw away any unused medicine after the expiration date. NOTE: This sheet is a summary. It may not cover all possible information. If you have questions about this medicine, talk to your doctor, pharmacist, or   health care provider.  2018 Elsevier/Gold Standard (2008-02-09 22:10:20)  

## 2017-08-27 ENCOUNTER — Ambulatory Visit: Payer: Managed Care, Other (non HMO) | Admitting: Obstetrics & Gynecology

## 2017-09-06 ENCOUNTER — Telehealth: Payer: Self-pay | Admitting: Obstetrics & Gynecology

## 2017-09-06 NOTE — Telephone Encounter (Signed)
Left patient a message to call back to reschedule a future appointment that was cancelled by the provider for an AEX. °

## 2017-09-12 ENCOUNTER — Ambulatory Visit (HOSPITAL_BASED_OUTPATIENT_CLINIC_OR_DEPARTMENT_OTHER): Payer: 59

## 2017-09-12 VITALS — BP 114/73 | HR 62 | Temp 100.0°F | Resp 18

## 2017-09-12 DIAGNOSIS — D510 Vitamin B12 deficiency anemia due to intrinsic factor deficiency: Secondary | ICD-10-CM

## 2017-09-12 MED ORDER — CYANOCOBALAMIN 1000 MCG/ML IJ SOLN
INTRAMUSCULAR | Status: AC
  Filled 2017-09-12: qty 1

## 2017-09-12 MED ORDER — CYANOCOBALAMIN 1000 MCG/ML IJ SOLN
1000.0000 ug | Freq: Once | INTRAMUSCULAR | Status: AC
  Administered 2017-09-12: 1000 ug via INTRAMUSCULAR

## 2017-09-12 NOTE — Patient Instructions (Signed)
Cyanocobalamin, Vitamin B12 injection What is this medicine? CYANOCOBALAMIN (sye an oh koe BAL a min) is a man made form of vitamin B12. Vitamin B12 is used in the growth of healthy blood cells, nerve cells, and proteins in the body. It also helps with the metabolism of fats and carbohydrates. This medicine is used to treat people who can not absorb vitamin B12. This medicine may be used for other purposes; ask your health care provider or pharmacist if you have questions. COMMON BRAND NAME(S): B-12 Compliance Kit, B-12 Injection Kit, Cyomin, LA-12, Nutri-Twelve, Physicians EZ Use B-12, Primabalt What should I tell my health care provider before I take this medicine? They need to know if you have any of these conditions: -kidney disease -Leber's disease -megaloblastic anemia -an unusual or allergic reaction to cyanocobalamin, cobalt, other medicines, foods, dyes, or preservatives -pregnant or trying to get pregnant -breast-feeding How should I use this medicine? This medicine is injected into a muscle or deeply under the skin. It is usually given by a health care professional in a clinic or doctor's office. However, your doctor may teach you how to inject yourself. Follow all instructions. Talk to your pediatrician regarding the use of this medicine in children. Special care may be needed. Overdosage: If you think you have taken too much of this medicine contact a poison control center or emergency room at once. NOTE: This medicine is only for you. Do not share this medicine with others. What if I miss a dose? If you are given your dose at a clinic or doctor's office, call to reschedule your appointment. If you give your own injections and you miss a dose, take it as soon as you can. If it is almost time for your next dose, take only that dose. Do not take double or extra doses. What may interact with this medicine? -colchicine -heavy alcohol intake This list may not describe all possible  interactions. Give your health care provider a list of all the medicines, herbs, non-prescription drugs, or dietary supplements you use. Also tell them if you smoke, drink alcohol, or use illegal drugs. Some items may interact with your medicine. What should I watch for while using this medicine? Visit your doctor or health care professional regularly. You may need blood work done while you are taking this medicine. You may need to follow a special diet. Talk to your doctor. Limit your alcohol intake and avoid smoking to get the best benefit. What side effects may I notice from receiving this medicine? Side effects that you should report to your doctor or health care professional as soon as possible: -allergic reactions like skin rash, itching or hives, swelling of the face, lips, or tongue -blue tint to skin -chest tightness, pain -difficulty breathing, wheezing -dizziness -red, swollen painful area on the leg Side effects that usually do not require medical attention (report to your doctor or health care professional if they continue or are bothersome): -diarrhea -headache This list may not describe all possible side effects. Call your doctor for medical advice about side effects. You may report side effects to FDA at 1-800-FDA-1088. Where should I keep my medicine? Keep out of the reach of children. Store at room temperature between 15 and 30 degrees C (59 and 85 degrees F). Protect from light. Throw away any unused medicine after the expiration date. NOTE: This sheet is a summary. It may not cover all possible information. If you have questions about this medicine, talk to your doctor, pharmacist, or   health care provider.  2018 Elsevier/Gold Standard (2008-02-09 22:10:20)  

## 2017-09-17 ENCOUNTER — Ambulatory Visit: Payer: Managed Care, Other (non HMO) | Admitting: Obstetrics & Gynecology

## 2017-09-19 ENCOUNTER — Other Ambulatory Visit: Payer: Self-pay

## 2017-09-19 ENCOUNTER — Encounter: Payer: Self-pay | Admitting: Obstetrics & Gynecology

## 2017-09-19 ENCOUNTER — Ambulatory Visit (INDEPENDENT_AMBULATORY_CARE_PROVIDER_SITE_OTHER): Payer: Managed Care, Other (non HMO) | Admitting: Obstetrics & Gynecology

## 2017-09-19 VITALS — BP 122/80 | HR 92 | Temp 98.9°F | Resp 16 | Ht 65.5 in | Wt 270.0 lb

## 2017-09-19 DIAGNOSIS — Z01419 Encounter for gynecological examination (general) (routine) without abnormal findings: Secondary | ICD-10-CM

## 2017-09-19 DIAGNOSIS — H65192 Other acute nonsuppurative otitis media, left ear: Secondary | ICD-10-CM | POA: Diagnosis not present

## 2017-09-19 MED ORDER — AMOXICILLIN 500 MG PO CAPS: 500.0000 mg | ORAL_CAPSULE | Freq: Two times a day (BID) | ORAL | 0 refills | Status: DC

## 2017-09-19 MED ORDER — NORETHIN-ETH ESTRADIOL-FE 0.8-25 MG-MCG PO CHEW: 1.0000 | CHEWABLE_TABLET | Freq: Every day | ORAL | 4 refills | Status: DC

## 2017-09-19 MED ORDER — FLUCONAZOLE 150 MG PO TABS: 150.0000 mg | ORAL_TABLET | Freq: Once | ORAL | 0 refills | Status: AC

## 2017-09-19 NOTE — Progress Notes (Signed)
41 y.o. G2P0 SingleAfrican AmericanF here for annual exam.  Doing well.  No vaginal bleeding.  Recently diagnosed with B12 deficiency.  Has stopped Zoloft due to weight gain.  Does not want to be on this at this time.  Feeling ok off medication.  Feels she knows herself well enough to be able to say when she needs it in the future.  Didn't take one months' OCPs correctly so had first cycle she's had in several years.  Wasn't too bad with flow and cramping.  Having right ear pain.  Has been going on for several days.  Had significant sinus drainage and issues for several days.  Also had associate laryngitis.  Some better but the ear pain is not.  No fevers.    Patient's last menstrual period was 07/13/2017.          Sexually active: No.  The current method of family planning is OCP (estrogen/progesterone).    Exercising: Yes.    weights, walking Smoker:  no  Health Maintenance: Pap:  06/04/16 Neg. HR HPV:neg   04/02/14 neg  History of abnormal Pap:  no MMG:  09/20/14 [place] Right BIRADS2:benign.  Pt planning MMG again at [place] Guidelines Colonoscopy:  Never BMD:   Never TDaP:  2012 Hep C testing: done Screening Labs: if needed   reports that  has never smoked. she has never used smokeless tobacco. She reports that she does not drink alcohol or use drugs.  Past Medical History:  Diagnosis Date  . Anemia    and low B12  . Heart murmur    as child - no problems as adult  . History of blood transfusion 2004   In [place]  . Hypothyroidism    history in past, resolved  . Inflammation of joint of knee    right knee-on anti-inflammatory, arthritis  . Seasonal allergies     Past Surgical History:  Procedure Laterality Date  . ABDOMINOPLASTY  2004  . BREAST REDUCTION SURGERY  2005  . GASTRIC BYPASS  2001   Dr Duwayne Heck, in [place]  . WISDOM TOOTH EXTRACTION  2014   upper right    Current Outpatient Medications  Medication Sig Dispense  Refill  . ALPRAZolam (XANAX) 0.5 MG tablet Take by mouth.    . Cyanocobalamin (VITAMIN B-12 IJ) Inject every 30 (thirty) days as directed.    . ferrous sulfate 220 (44 FE) MG/5ML solution Take 5 mLs (220 mg total) by mouth daily. 480 mL 1  . KAITLIB FE 0.8-25 MG-MCG tablet CHEW 1 TABLET DAILY 84 tablet 2  . montelukast (SINGULAIR) 10 MG tablet Take 1 tablet (10 mg total) by mouth at bedtime. 90 tablet 4  . Multiple Vitamins-Minerals (MULTIVITAMIN PO) Take by mouth. Prenatal chewable-Target Brand    . sertraline (ZOLOFT) 50 MG tablet      No current facility-administered medications for this visit.     Family History  Problem Relation Age of Onset  . CVA Maternal Grandmother   . Hypertension Brother   . Diabetes Brother   . Heart disease Brother   . Diabetes Sister   . Hypertension Sister   . Diabetes Sister   . Hypertension Sister   . Diabetes Brother   . Hypertension Brother   . Heart disease Brother   . Diabetes Brother   . Hypertension Brother   . Heart disease Brother     ROS:  Pertinent items are noted in HPI.  Otherwise, a  comprehensive ROS was negative.  Exam:   BP 122/80 (BP Location: Right Arm, Patient Position: Sitting, Cuff Size: Large)   Pulse 92   Temp 98.9 F (37.2 C) (Oral)   Resp 16   Ht 5' 5.5" (1.664 m)   Wt 270 lb (122.5 kg)   LMP 07/13/2017   BMI 44.25 kg/m   Weight change:    Height: 5' 5.5" (166.4 cm)  Ht Readings from Last 3 Encounters:  09/19/17 5' 5.5" (1.664 m)  08/02/16 5' 5.5" (1.664 m)  06/20/16 5' 5.5" (1.664 m)    General appearance: alert, cooperative and appears stated age Head: Normocephalic, without obvious abnormality, atraumatic Neck: no adenopathy, supple, symmetrical, trachea midline and thyroid normal to inspection and palpation Lungs: clear to auscultation bilaterally Breasts: normal appearance, no masses or tenderness Heart: regular rate and rhythm Abdomen: soft, non-tender; bowel sounds normal; no masses,  no  organomegaly Extremities: extremities normal, atraumatic, no cyanosis or edema Skin: Skin color, texture, turgor normal. No rashes or lesions Lymph nodes: Cervical, supraclavicular, and axillary nodes normal. No abnormal inguinal nodes palpated Neurologic: Grossly normal Ear:  Fullness present behind Tympanic Membrane  Pelvic: External genitalia:  no lesions              Urethra:  normal appearing urethra with no masses, tenderness or lesions              Bartholins and Skenes: normal                 Vagina: normal appearing vagina with normal color and discharge, no lesions              Cervix: no lesions              Pap taken: No. Bimanual Exam:  Uterus:  normal size, contour, position, consistency, mobility, non-tender              Adnexa: normal adnexa and no mass, fullness, tenderness               Rectovaginal: Confirms               Anus:  normal sphincter tone, no lesions  Chaperone was present for exam.  A:  Well Woman with normal exam Amenorrhea on continuous OCPs Pernicious anemia Otitis media  P:   Mammogram guidelines reviewed pap smear not indicated No lab work obtained today.  Followed by hematology. Amoxil and Diflucan rx's given Rx for OCPs, 90 day supply with RF given. return annually or prn

## 2017-10-08 ENCOUNTER — Other Ambulatory Visit: Payer: Self-pay

## 2017-10-08 ENCOUNTER — Encounter: Payer: Self-pay | Admitting: Family

## 2017-10-08 ENCOUNTER — Ambulatory Visit (HOSPITAL_BASED_OUTPATIENT_CLINIC_OR_DEPARTMENT_OTHER): Payer: 59 | Admitting: Family

## 2017-10-08 ENCOUNTER — Ambulatory Visit (HOSPITAL_BASED_OUTPATIENT_CLINIC_OR_DEPARTMENT_OTHER): Payer: 59

## 2017-10-08 ENCOUNTER — Other Ambulatory Visit (HOSPITAL_BASED_OUTPATIENT_CLINIC_OR_DEPARTMENT_OTHER): Payer: 59

## 2017-10-08 VITALS — BP 111/64 | HR 77 | Temp 98.3°F | Resp 18 | Wt 271.0 lb

## 2017-10-08 DIAGNOSIS — E559 Vitamin D deficiency, unspecified: Secondary | ICD-10-CM | POA: Diagnosis not present

## 2017-10-08 DIAGNOSIS — K909 Intestinal malabsorption, unspecified: Secondary | ICD-10-CM | POA: Diagnosis not present

## 2017-10-08 DIAGNOSIS — D510 Vitamin B12 deficiency anemia due to intrinsic factor deficiency: Secondary | ICD-10-CM

## 2017-10-08 DIAGNOSIS — Z9884 Bariatric surgery status: Secondary | ICD-10-CM

## 2017-10-08 DIAGNOSIS — D508 Other iron deficiency anemias: Secondary | ICD-10-CM

## 2017-10-08 LAB — CMP (CANCER CENTER ONLY)
ALBUMIN: 3 g/dL — AB (ref 3.3–5.5)
ALT: 23 U/L (ref 10–47)
AST: 25 U/L (ref 11–38)
Alkaline Phosphatase: 78 U/L (ref 26–84)
BILIRUBIN TOTAL: 0.3 mg/dL (ref 0.20–1.60)
BUN: 9 mg/dL (ref 7–22)
CO2: 25 mEq/L (ref 18–33)
CREATININE: 0.8 mg/dL (ref 0.6–1.2)
Calcium: 9.1 mg/dL (ref 8.0–10.3)
Chloride: 109 mEq/L — ABNORMAL HIGH (ref 98–108)
Glucose, Bld: 93 mg/dL (ref 73–118)
Potassium: 3.9 mEq/L (ref 3.3–4.7)
SODIUM: 144 meq/L (ref 128–145)
TOTAL PROTEIN: 7 g/dL (ref 6.4–8.1)

## 2017-10-08 LAB — CBC WITH DIFFERENTIAL (CANCER CENTER ONLY)
BASO#: 0 10*3/uL (ref 0.0–0.2)
BASO%: 0.3 % (ref 0.0–2.0)
EOS%: 1.6 % (ref 0.0–7.0)
Eosinophils Absolute: 0.1 10*3/uL (ref 0.0–0.5)
HCT: 39.4 % (ref 34.8–46.6)
HGB: 12.8 g/dL (ref 11.6–15.9)
LYMPH#: 1.9 10*3/uL (ref 0.9–3.3)
LYMPH%: 30.1 % (ref 14.0–48.0)
MCH: 28.2 pg (ref 26.0–34.0)
MCHC: 32.5 g/dL (ref 32.0–36.0)
MCV: 87 fL (ref 81–101)
MONO#: 0.7 10*3/uL (ref 0.1–0.9)
MONO%: 11.1 % (ref 0.0–13.0)
NEUT#: 3.6 10*3/uL (ref 1.5–6.5)
NEUT%: 56.9 % (ref 39.6–80.0)
PLATELETS: 302 10*3/uL (ref 145–400)
RBC: 4.54 10*6/uL (ref 3.70–5.32)
RDW: 13.6 % (ref 11.1–15.7)
WBC: 6.3 10*3/uL (ref 3.9–10.0)

## 2017-10-08 LAB — IRON AND TIBC
%SAT: 34 % (ref 21–57)
Iron: 109 ug/dL (ref 41–142)
TIBC: 316 ug/dL (ref 236–444)
UIBC: 207 ug/dL (ref 120–384)

## 2017-10-08 LAB — FERRITIN: Ferritin: 26 ng/ml (ref 9–269)

## 2017-10-08 LAB — RETICULOCYTES: Reticulocyte Count: 1.2 % (ref 0.6–2.6)

## 2017-10-08 MED ORDER — CYANOCOBALAMIN 1000 MCG/ML IJ SOLN
1000.0000 ug | Freq: Once | INTRAMUSCULAR | Status: AC
  Administered 2017-10-08: 1000 ug via INTRAMUSCULAR

## 2017-10-08 MED ORDER — CYANOCOBALAMIN 1000 MCG/ML IJ SOLN
INTRAMUSCULAR | Status: AC
  Filled 2017-10-08: qty 1

## 2017-10-08 NOTE — Progress Notes (Signed)
Hematology and Oncology Follow Up Visit  Sheri [name] [id number] 07-Dec-1975 41 y.o. 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 She is feeling fatigued and having some mild SOB with over exertion.  She has had a couple episodes of dizziness without syncope or falls.  She ran out of birth control in September and had a breakthrough cycle for 4-5 days. She is now back on her birth control.  No other bleeding, no bruising or petechiae.  No fever, chills, n/v, cough, rash, chest pain, palpitations, abdominal pain or changes in bowel or bladder habits.  No swelling or tenderness in her extremities. She has intermittent numbness and tingling in her hands and feet. This improves when she changes her position.  Her appetite comes and goes. She is hydrating well and her weight is stable.  She is active walking for exercise. She is also helping take care if her sister's 38 mo old twin grandchildren. She really enjoys this.   ECOG Performance Status: 1 - Symptomatic but completely ambulatory  Medications:  Allergies as of 10/08/2017      Reactions   Aspirin Other (See Comments)   Pt. Has had gastric bypass surgery.  ASA causes ulcers.  Can take Aleve.      Medication List        Accurate as of 10/08/17  8:52 AM. Always use your most recent med list.          amoxicillin 500 MG capsule Commonly known as:  AMOXIL Take 1 capsule (500 mg total) 2 (two) times daily by mouth.   ferrous sulfate 220 (44 Fe) MG/5ML solution Take 5 mLs (220 mg total) by mouth daily.   montelukast 10 MG tablet Commonly known as:  SINGULAIR Take 1 tablet (10 mg total) by mouth at bedtime.   MULTIVITAMIN PO Take by mouth. Prenatal chewable-Target Brand     Norethindrone-Ethinyl Estradiol-Fe 0.8-25 MG-MCG tablet Commonly known as:  KAITLIB FE Chew 1 tablet daily by mouth.   VITAMIN B-12 IJ Inject every 30 (thirty) days as directed.       Allergies:  Allergies  Allergen Reactions  . Aspirin Other (See Comments)    Pt. Has had gastric bypass surgery.  ASA causes ulcers.  Can take Aleve.    Past Medical History, Surgical history, Social history, and Family History were reviewed and updated.  Review of Systems: All other 10 point review of systems is negative.   Physical Exam:  weight is 271 lb (122.9 kg). Her oral temperature is 98.3 F (36.8 C). Her blood pressure is 111/64 and her pulse is 77. Her respiration is 18 and oxygen saturation is 96%.   Wt Readings from Last 3 Encounters:  10/08/17 271 lb (122.9 kg)  09/19/17 270 lb (122.5 kg)  07/10/17 269 lb (122 kg)    Ocular: Sclerae unicteric, pupils equal, round and reactive to light Ear-nose-throat: Oropharynx clear, dentition fair Lymphatic: No cervical, supraclavicular or axillary adenopathy Lungs no rales or rhonchi, good excursion bilaterally Heart regular rate and rhythm, no murmur appreciated Abd soft, nontender, positive bowel sounds, no liver or spleen tip palpated on exam, no fluid wave  MSK no focal spinal tenderness, no joint edema Neuro: non-focal, well-oriented, appropriate affect Breasts: Deferred  Lab Results  Component Value Date   WBC 6.3 10/08/2017   HGB 12.8 10/08/2017   HCT 39.4 10/08/2017   MCV 87 10/08/2017   PLT 302 10/08/2017   Lab Results  Component Value Date   FERRITIN 37 07/10/2017   IRON 101 07/10/2017   TIBC 346 07/10/2017   UIBC 245 07/10/2017   IRONPCTSAT 29 07/10/2017   Lab Results  Component Value Date   RBC 4.54 10/08/2017   No results found for: KPAFRELGTCHN, LAMBDASER, KAPLAMBRATIO No results found for: IGGSERUM, IGA, IGMSERUM No results found for: Odetta Pink,  SPEI   Chemistry      Component Value Date/Time   NA 144 10/08/2017 0812   NA 140 07/10/2017 0838   K 3.9 10/08/2017 0812   K 3.6 07/10/2017 0838   CL 109 (H) 10/08/2017 0812   CO2 25 10/08/2017 0812   CO2 23 07/10/2017 0838   BUN 9 10/08/2017 0812   BUN 9.0 07/10/2017 0838   CREATININE 0.8 10/08/2017 0812   CREATININE 0.8 07/10/2017 0838      Component Value Date/Time   CALCIUM 9.1 10/08/2017 0812   CALCIUM 9.2 07/10/2017 0838   ALKPHOS 78 10/08/2017 0812   ALKPHOS 127 07/10/2017 0838   AST 25 10/08/2017 0812   AST 22 07/10/2017 0838   ALT 23 10/08/2017 0812   ALT 22 07/10/2017 0838   BILITOT 0.30 10/08/2017 0812   BILITOT 0.34 07/10/2017 0838      Impression and Plan: Sheri [name] is a very pleasant 41 yo African American female with history of gastric bypass with iron deficiency anemia, pernicious anemia and 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 She is symptomatic with fatigue and SOB with over exertion.  We will proceed with her B 12 injection today as planned.  Vit D and iron studies are pending. We will bring her back in next week for infusion if needed.  We will plan to see her back again in another 3 months for follow-up and lab.  She will contact our office with any questions or concerns. We can certainly see her sooner if need be.   Eliezer Bottom, NP 11/27/20188:52 AM

## 2017-10-08 NOTE — Patient Instructions (Signed)
Cyanocobalamin, Vitamin B12 injection What is this medicine? CYANOCOBALAMIN (sye an oh koe BAL a min) is a man made form of vitamin B12. Vitamin B12 is used in the growth of healthy blood cells, nerve cells, and proteins in the body. It also helps with the metabolism of fats and carbohydrates. This medicine is used to treat people who can not absorb vitamin B12. This medicine may be used for other purposes; ask your health care provider or pharmacist if you have questions. COMMON BRAND NAME(S): B-12 Compliance Kit, B-12 Injection Kit, Cyomin, LA-12, Nutri-Twelve, Physicians EZ Use B-12, Primabalt What should I tell my health care provider before I take this medicine? They need to know if you have any of these conditions: -kidney disease -Leber's disease -megaloblastic anemia -an unusual or allergic reaction to cyanocobalamin, cobalt, other medicines, foods, dyes, or preservatives -pregnant or trying to get pregnant -breast-feeding How should I use this medicine? This medicine is injected into a muscle or deeply under the skin. It is usually given by a health care professional in a clinic or doctor's office. However, your doctor may teach you how to inject yourself. Follow all instructions. Talk to your pediatrician regarding the use of this medicine in children. Special care may be needed. Overdosage: If you think you have taken too much of this medicine contact a poison control center or emergency room at once. NOTE: This medicine is only for you. Do not share this medicine with others. What if I miss a dose? If you are given your dose at a clinic or doctor's office, call to reschedule your appointment. If you give your own injections and you miss a dose, take it as soon as you can. If it is almost time for your next dose, take only that dose. Do not take double or extra doses. What may interact with this medicine? -colchicine -heavy alcohol intake This list may not describe all possible  interactions. Give your health care provider a list of all the medicines, herbs, non-prescription drugs, or dietary supplements you use. Also tell them if you smoke, drink alcohol, or use illegal drugs. Some items may interact with your medicine. What should I watch for while using this medicine? Visit your doctor or health care professional regularly. You may need blood work done while you are taking this medicine. You may need to follow a special diet. Talk to your doctor. Limit your alcohol intake and avoid smoking to get the best benefit. What side effects may I notice from receiving this medicine? Side effects that you should report to your doctor or health care professional as soon as possible: -allergic reactions like skin rash, itching or hives, swelling of the face, lips, or tongue -blue tint to skin -chest tightness, pain -difficulty breathing, wheezing -dizziness -red, swollen painful area on the leg Side effects that usually do not require medical attention (report to your doctor or health care professional if they continue or are bothersome): -diarrhea -headache This list may not describe all possible side effects. Call your doctor for medical advice about side effects. You may report side effects to FDA at 1-800-FDA-1088. Where should I keep my medicine? Keep out of the reach of children. Store at room temperature between 15 and 30 degrees C (59 and 85 degrees F). Protect from light. Throw away any unused medicine after the expiration date. NOTE: This sheet is a summary. It may not cover all possible information. If you have questions about this medicine, talk to your doctor, pharmacist, or   health care provider.  2018 Elsevier/Gold Standard (2008-02-09 22:10:20)  

## 2017-10-09 ENCOUNTER — Encounter: Payer: Self-pay | Admitting: *Deleted

## 2017-10-09 ENCOUNTER — Telehealth: Payer: Self-pay | Admitting: *Deleted

## 2017-10-09 ENCOUNTER — Other Ambulatory Visit: Payer: Self-pay | Admitting: *Deleted

## 2017-10-09 ENCOUNTER — Other Ambulatory Visit: Payer: Self-pay | Admitting: Family

## 2017-10-09 LAB — VITAMIN D 25 HYDROXY (VIT D DEFICIENCY, FRACTURES): VIT D 25 HYDROXY: 19.1 ng/mL — AB (ref 30.0–100.0)

## 2017-10-09 MED ORDER — ERGOCALCIFEROL 1.25 MG (50000 UT) PO CAPS: 50000.0000 [IU] | ORAL_CAPSULE | ORAL | 6 refills | Status: DC

## 2017-10-09 NOTE — Telephone Encounter (Addendum)
Message left on personal voice mail. Message sent via MyChart  ----- Message from Eliezer Bottom, NP sent at 10/09/2017  9:02 AM EST ----- Regarding: Vit D  Vit D is low, prescription sent to [facility] for supplement 50,000 units weekly. Thank you!  [name]  ----- Message ----- From: Interface, Lab In Three Zero One Sent: 10/08/2017   8:21 AM To: Eliezer Bottom, NP

## 2017-10-10 ENCOUNTER — Ambulatory Visit: Payer: 59

## 2017-11-07 ENCOUNTER — Other Ambulatory Visit: Payer: Self-pay

## 2017-11-07 ENCOUNTER — Ambulatory Visit (HOSPITAL_BASED_OUTPATIENT_CLINIC_OR_DEPARTMENT_OTHER): Payer: 59

## 2017-11-07 VITALS — BP 123/75 | HR 71 | Temp 98.3°F | Resp 18

## 2017-11-07 DIAGNOSIS — D510 Vitamin B12 deficiency anemia due to intrinsic factor deficiency: Secondary | ICD-10-CM

## 2017-11-07 MED ORDER — CYANOCOBALAMIN 1000 MCG/ML IJ SOLN
INTRAMUSCULAR | Status: AC
  Filled 2017-11-07: qty 1

## 2017-11-07 MED ORDER — CYANOCOBALAMIN 1000 MCG/ML IJ SOLN
1000.0000 ug | Freq: Once | INTRAMUSCULAR | Status: AC
  Administered 2017-11-07: 1000 ug via INTRAMUSCULAR

## 2017-11-07 NOTE — Patient Instructions (Signed)
Cyanocobalamin, Vitamin B12 injection What is this medicine? CYANOCOBALAMIN (sye an oh koe BAL a min) is a man made form of vitamin B12. Vitamin B12 is used in the growth of healthy blood cells, nerve cells, and proteins in the body. It also helps with the metabolism of fats and carbohydrates. This medicine is used to treat people who can not absorb vitamin B12. This medicine may be used for other purposes; ask your health care provider or pharmacist if you have questions. COMMON BRAND NAME(S): B-12 Compliance Kit, B-12 Injection Kit, Cyomin, LA-12, Nutri-Twelve, Physicians EZ Use B-12, Primabalt What should I tell my health care provider before I take this medicine? They need to know if you have any of these conditions: -kidney disease -Leber's disease -megaloblastic anemia -an unusual or allergic reaction to cyanocobalamin, cobalt, other medicines, foods, dyes, or preservatives -pregnant or trying to get pregnant -breast-feeding How should I use this medicine? This medicine is injected into a muscle or deeply under the skin. It is usually given by a health care professional in a clinic or doctor's office. However, your doctor may teach you how to inject yourself. Follow all instructions. Talk to your pediatrician regarding the use of this medicine in children. Special care may be needed. Overdosage: If you think you have taken too much of this medicine contact a poison control center or emergency room at once. NOTE: This medicine is only for you. Do not share this medicine with others. What if I miss a dose? If you are given your dose at a clinic or doctor's office, call to reschedule your appointment. If you give your own injections and you miss a dose, take it as soon as you can. If it is almost time for your next dose, take only that dose. Do not take double or extra doses. What may interact with this medicine? -colchicine -heavy alcohol intake This list may not describe all possible  interactions. Give your health care provider a list of all the medicines, herbs, non-prescription drugs, or dietary supplements you use. Also tell them if you smoke, drink alcohol, or use illegal drugs. Some items may interact with your medicine. What should I watch for while using this medicine? Visit your doctor or health care professional regularly. You may need blood work done while you are taking this medicine. You may need to follow a special diet. Talk to your doctor. Limit your alcohol intake and avoid smoking to get the best benefit. What side effects may I notice from receiving this medicine? Side effects that you should report to your doctor or health care professional as soon as possible: -allergic reactions like skin rash, itching or hives, swelling of the face, lips, or tongue -blue tint to skin -chest tightness, pain -difficulty breathing, wheezing -dizziness -red, swollen painful area on the leg Side effects that usually do not require medical attention (report to your doctor or health care professional if they continue or are bothersome): -diarrhea -headache This list may not describe all possible side effects. Call your doctor for medical advice about side effects. You may report side effects to FDA at 1-800-FDA-1088. Where should I keep my medicine? Keep out of the reach of children. Store at room temperature between 15 and 30 degrees C (59 and 85 degrees F). Protect from light. Throw away any unused medicine after the expiration date. NOTE: This sheet is a summary. It may not cover all possible information. If you have questions about this medicine, talk to your doctor, pharmacist, or   health care provider.  2018 Elsevier/Gold Standard (2008-02-09 22:10:20)  

## 2017-12-05 ENCOUNTER — Other Ambulatory Visit: Payer: Self-pay

## 2017-12-05 ENCOUNTER — Inpatient Hospital Stay: Payer: 59 | Attending: Family

## 2017-12-05 VITALS — BP 112/62 | HR 64 | Temp 98.7°F | Resp 20

## 2017-12-05 DIAGNOSIS — Z79899 Other long term (current) drug therapy: Secondary | ICD-10-CM | POA: Insufficient documentation

## 2017-12-05 DIAGNOSIS — Z9884 Bariatric surgery status: Secondary | ICD-10-CM | POA: Diagnosis not present

## 2017-12-05 DIAGNOSIS — D509 Iron deficiency anemia, unspecified: Secondary | ICD-10-CM | POA: Insufficient documentation

## 2017-12-05 DIAGNOSIS — D510 Vitamin B12 deficiency anemia due to intrinsic factor deficiency: Secondary | ICD-10-CM

## 2017-12-05 MED ORDER — CYANOCOBALAMIN 1000 MCG/ML IJ SOLN
INTRAMUSCULAR | Status: AC
  Filled 2017-12-05: qty 1

## 2017-12-05 MED ORDER — CYANOCOBALAMIN 1000 MCG/ML IJ SOLN
1000.0000 ug | Freq: Once | INTRAMUSCULAR | Status: AC
  Administered 2017-12-05: 1000 ug via INTRAMUSCULAR

## 2018-01-07 ENCOUNTER — Inpatient Hospital Stay: Payer: 59

## 2018-01-07 ENCOUNTER — Encounter: Payer: Self-pay | Admitting: Family

## 2018-01-07 ENCOUNTER — Inpatient Hospital Stay: Payer: 59 | Attending: Family | Admitting: Family

## 2018-01-07 ENCOUNTER — Other Ambulatory Visit: Payer: Self-pay

## 2018-01-07 VITALS — BP 107/66 | HR 58 | Temp 98.0°F | Resp 17 | Wt 279.0 lb

## 2018-01-07 DIAGNOSIS — D510 Vitamin B12 deficiency anemia due to intrinsic factor deficiency: Secondary | ICD-10-CM

## 2018-01-07 DIAGNOSIS — E559 Vitamin D deficiency, unspecified: Secondary | ICD-10-CM | POA: Diagnosis not present

## 2018-01-07 DIAGNOSIS — D539 Nutritional anemia, unspecified: Secondary | ICD-10-CM | POA: Insufficient documentation

## 2018-01-07 DIAGNOSIS — E538 Deficiency of other specified B group vitamins: Secondary | ICD-10-CM | POA: Diagnosis not present

## 2018-01-07 DIAGNOSIS — D508 Other iron deficiency anemias: Secondary | ICD-10-CM

## 2018-01-07 DIAGNOSIS — K909 Intestinal malabsorption, unspecified: Secondary | ICD-10-CM | POA: Insufficient documentation

## 2018-01-07 DIAGNOSIS — Z9884 Bariatric surgery status: Secondary | ICD-10-CM | POA: Insufficient documentation

## 2018-01-07 DIAGNOSIS — D509 Iron deficiency anemia, unspecified: Secondary | ICD-10-CM | POA: Diagnosis not present

## 2018-01-07 DIAGNOSIS — Z79899 Other long term (current) drug therapy: Secondary | ICD-10-CM | POA: Diagnosis not present

## 2018-01-07 DIAGNOSIS — K219 Gastro-esophageal reflux disease without esophagitis: Secondary | ICD-10-CM | POA: Diagnosis not present

## 2018-01-07 LAB — CMP (CANCER CENTER ONLY)
ALT: 25 U/L (ref 10–47)
ANION GAP: 7 (ref 5–15)
AST: 26 U/L (ref 11–38)
Albumin: 3.1 g/dL — ABNORMAL LOW (ref 3.5–5.0)
Alkaline Phosphatase: 86 U/L — ABNORMAL HIGH (ref 26–84)
BUN: 10 mg/dL (ref 7–22)
CALCIUM: 9.3 mg/dL (ref 8.0–10.3)
CO2: 25 mmol/L (ref 18–33)
Chloride: 110 mmol/L — ABNORMAL HIGH (ref 98–108)
Creatinine: 0.9 mg/dL (ref 0.60–1.20)
GLUCOSE: 94 mg/dL (ref 73–118)
POTASSIUM: 3.6 mmol/L (ref 3.3–4.7)
SODIUM: 142 mmol/L (ref 128–145)
Total Bilirubin: 0.5 mg/dL (ref 0.2–1.6)
Total Protein: 7.4 g/dL (ref 6.4–8.1)

## 2018-01-07 LAB — CBC WITH DIFFERENTIAL (CANCER CENTER ONLY)
Basophils Absolute: 0 10*3/uL (ref 0.0–0.1)
Basophils Relative: 0 %
EOS PCT: 4 %
Eosinophils Absolute: 0.3 10*3/uL (ref 0.0–0.5)
HEMATOCRIT: 39.7 % (ref 34.8–46.6)
Hemoglobin: 13 g/dL (ref 11.6–15.9)
LYMPHS PCT: 29 %
Lymphs Abs: 2.1 10*3/uL (ref 0.9–3.3)
MCH: 28.3 pg (ref 26.0–34.0)
MCHC: 32.7 g/dL (ref 32.0–36.0)
MCV: 86.5 fL (ref 81.0–101.0)
MONOS PCT: 12 %
Monocytes Absolute: 0.8 10*3/uL (ref 0.1–0.9)
Neutro Abs: 3.9 10*3/uL (ref 1.5–6.5)
Neutrophils Relative %: 55 %
Platelet Count: 281 10*3/uL (ref 145–400)
RBC: 4.59 MIL/uL (ref 3.70–5.32)
RDW: 15 % (ref 11.1–15.7)
WBC Count: 7.1 10*3/uL (ref 3.9–10.0)

## 2018-01-07 LAB — IRON AND TIBC
Iron: 112 ug/dL (ref 41–142)
Saturation Ratios: 31 % (ref 21–57)
TIBC: 364 ug/dL (ref 236–444)
UIBC: 252 ug/dL

## 2018-01-07 LAB — VITAMIN B12: VITAMIN B 12: 226 pg/mL (ref 180–914)

## 2018-01-07 LAB — RETICULOCYTES
RBC.: 4.57 MIL/uL (ref 3.70–5.45)
RETIC CT PCT: 1.3 % (ref 0.7–2.1)
Retic Count, Absolute: 59.4 10*3/uL (ref 33.7–90.7)

## 2018-01-07 LAB — FERRITIN: FERRITIN: 23 ng/mL (ref 9–269)

## 2018-01-07 MED ORDER — CYANOCOBALAMIN 1000 MCG/ML IJ SOLN
INTRAMUSCULAR | Status: AC
  Filled 2018-01-07: qty 1

## 2018-01-07 MED ORDER — CYANOCOBALAMIN 1000 MCG/ML IJ SOLN
1000.0000 ug | Freq: Once | INTRAMUSCULAR | Status: AC
  Administered 2018-01-07: 1000 ug via INTRAMUSCULAR

## 2018-01-07 NOTE — Patient Instructions (Signed)

## 2018-01-07 NOTE — Progress Notes (Signed)
Hematology and Oncology Follow Up Visit  TARRIE MCMICHEN [id number] 1976/08/14 42 y.o. 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 Gardiner is here today for follow-up. She has had some fatigue, chewing ice, headaches and occasional SOB with exertion.  She is hoping to be able to get back in the gym and lose some weight. She is eating well and staying hydrated.  She has some nausea due to GERD. No vomiting.  No fever, chills, cough, rash, dizziness, chest pain, palpitations, abdominal pain or changes in bowel or bladder habits.  No swelling, tenderness, numbness or tingling in her extremities at this time. No c/o pain. No episodes of bleeding, no bruising or petechiae.    ECOG Performance Status: 1 - Symptomatic but completely ambulatory  Medications:  Allergies as of 01/07/2018      Reactions   Aspirin Other (See Comments)   Pt. Has had gastric bypass surgery.  ASA causes ulcers.  Can take Aleve.      Medication List        Accurate as of 01/07/18  8:49 AM. Always use your most recent med list.          ergocalciferol 50000 units capsule Commonly known as:  VITAMIN D2 Take 1 capsule (50,000 Units total) by mouth once a week.   montelukast 10 MG tablet Commonly known as:  SINGULAIR Take 1 tablet (10 mg total) by mouth at bedtime.   MULTIVITAMIN PO Take by mouth. Prenatal chewable-Target Brand   Norethindrone-Ethinyl Estradiol-Fe 0.8-25 MG-MCG tablet Commonly known as:  KAITLIB FE Chew 1 tablet daily by mouth.   VITAMIN B-12 IJ Inject every 30 (thirty) days as directed.       Allergies:  Allergies  Allergen Reactions  . Aspirin Other (See Comments)    Pt. Has had gastric bypass surgery.  ASA causes ulcers.  Can take Aleve.    Past Medical History,  Surgical history, Social history, and Family History were reviewed and updated.  Review of Systems: All other 10 point review of systems is negative.   Physical Exam:  weight is 279 lb (126.6 kg). Her oral temperature is 98 F (36.7 C). Her blood pressure is 107/66 and her pulse is 58 (abnormal). Her respiration is 17 and oxygen saturation is 100%.   Wt Readings from Last 3 Encounters:  01/07/18 279 lb (126.6 kg)  10/08/17 271 lb (122.9 kg)  09/19/17 270 lb (122.5 kg)    Ocular: Sclerae unicteric, pupils equal, round and reactive to light Ear-nose-throat: Oropharynx clear, dentition fair Lymphatic: No cervical, supraclavicular or axillary adenopathy Lungs no rales or rhonchi, good excursion bilaterally Heart regular rate and rhythm, no murmur appreciated Abd soft, nontender, positive bowel sounds, no liver or spleen tip palpated on exam, no fluid wave  MSK no focal spinal tenderness, no joint edema Neuro: non-focal, well-oriented, appropriate affect Breasts: Deferred   Lab Results  Component Value Date   WBC 7.1 01/07/2018   HGB 12.8 10/08/2017   HCT 39.7 01/07/2018   MCV 86.5 01/07/2018   PLT 281 01/07/2018   Lab Results  Component Value Date   FERRITIN 26 10/08/2017   IRON 109 10/08/2017   TIBC 316 10/08/2017   UIBC 207 10/08/2017   IRONPCTSAT 34 10/08/2017   Lab Results  Component  Value Date   RBC 4.59 01/07/2018   No results found for: KPAFRELGTCHN, LAMBDASER, KAPLAMBRATIO No results found for: Kandis Cocking, IGMSERUM No results found for: Odetta Pink, SPEI   Chemistry      Component Value Date/Time   NA 144 10/08/2017 0812   NA 140 07/10/2017 0838   K 3.9 10/08/2017 0812   K 3.6 07/10/2017 0838   CL 109 (H) 10/08/2017 0812   CO2 25 10/08/2017 0812   CO2 23 07/10/2017 0838   BUN 9 10/08/2017 0812   BUN 9.0 07/10/2017 0838   CREATININE 0.8 10/08/2017 0812   CREATININE 0.8 07/10/2017 0838        Component Value Date/Time   CALCIUM 9.1 10/08/2017 0812   CALCIUM 9.2 07/10/2017 0838   ALKPHOS 78 10/08/2017 0812   ALKPHOS 127 07/10/2017 0838   AST 25 10/08/2017 0812   AST 22 07/10/2017 0838   ALT 23 10/08/2017 0812   ALT 22 07/10/2017 0838   BILITOT 0.30 10/08/2017 0812   BILITOT 0.34 07/10/2017 0838       Impression and Plan: Ms. 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  She is taking her vitamin D once a week as prescribed.  She received B 12 today as planned.  We will see what her iron studies show and bring her back for infusion if needed.  We will plan to see her back in another 3 months for follow-up.  She will contact our office with any questions or concerns. We can certainly see her sooner if need be.   Laverna Peace, NP 2/26/20198:49 AM

## 2018-01-08 LAB — VITAMIN D 25 HYDROXY (VIT D DEFICIENCY, FRACTURES): VIT D 25 HYDROXY: 31.5 ng/mL (ref 30.0–100.0)

## 2018-01-10 ENCOUNTER — Encounter: Payer: Self-pay | Admitting: Family

## 2018-04-08 ENCOUNTER — Inpatient Hospital Stay: Payer: 59 | Attending: Family | Admitting: Family

## 2018-04-08 ENCOUNTER — Inpatient Hospital Stay: Payer: 59

## 2018-04-08 VITALS — BP 111/64 | HR 60 | Temp 98.1°F | Resp 18 | Wt 273.8 lb

## 2018-04-08 DIAGNOSIS — Z79899 Other long term (current) drug therapy: Secondary | ICD-10-CM | POA: Diagnosis not present

## 2018-04-08 DIAGNOSIS — D509 Iron deficiency anemia, unspecified: Secondary | ICD-10-CM | POA: Diagnosis not present

## 2018-04-08 DIAGNOSIS — D510 Vitamin B12 deficiency anemia due to intrinsic factor deficiency: Secondary | ICD-10-CM | POA: Insufficient documentation

## 2018-04-08 DIAGNOSIS — K912 Postsurgical malabsorption, not elsewhere classified: Secondary | ICD-10-CM | POA: Insufficient documentation

## 2018-04-08 DIAGNOSIS — E559 Vitamin D deficiency, unspecified: Secondary | ICD-10-CM | POA: Diagnosis not present

## 2018-04-08 DIAGNOSIS — D508 Other iron deficiency anemias: Secondary | ICD-10-CM

## 2018-04-08 DIAGNOSIS — Z9884 Bariatric surgery status: Secondary | ICD-10-CM | POA: Diagnosis not present

## 2018-04-08 LAB — CMP (CANCER CENTER ONLY)
ALK PHOS: 81 U/L (ref 40–150)
ALT: 23 U/L (ref 0–55)
ANION GAP: 9 (ref 3–11)
AST: 22 U/L (ref 5–34)
Albumin: 3.3 g/dL — ABNORMAL LOW (ref 3.5–5.0)
BILIRUBIN TOTAL: 0.3 mg/dL (ref 0.2–1.2)
BUN: 10 mg/dL (ref 7–26)
CALCIUM: 8.9 mg/dL (ref 8.4–10.4)
CO2: 18 mmol/L — ABNORMAL LOW (ref 22–29)
CREATININE: 0.77 mg/dL (ref 0.60–1.10)
Chloride: 111 mmol/L — ABNORMAL HIGH (ref 98–109)
GFR, Estimated: >60 mL/min (ref >60)
GLUCOSE: 82 mg/dL (ref 70–140)
Potassium: 4.1 mmol/L (ref 3.5–5.1)
Sodium: 138 mmol/L (ref 136–145)
TOTAL PROTEIN: 7.3 g/dL (ref 6.4–8.3)

## 2018-04-08 LAB — CBC WITH DIFFERENTIAL (CANCER CENTER ONLY)
Basophils Absolute: 0 10*3/uL (ref 0.0–0.1)
Basophils Relative: 1 %
Eosinophils Absolute: 0.1 10*3/uL (ref 0.0–0.5)
Eosinophils Relative: 2 %
HEMATOCRIT: 40.8 % (ref 34.8–46.6)
HEMOGLOBIN: 13.1 g/dL (ref 11.6–15.9)
LYMPHS ABS: 1.7 10*3/uL (ref 0.9–3.3)
LYMPHS PCT: 28 %
MCH: 28.2 pg (ref 26.0–34.0)
MCHC: 32.1 g/dL (ref 32.0–36.0)
MCV: 87.7 fL (ref 81.0–101.0)
MONOS PCT: 13 %
Monocytes Absolute: 0.8 10*3/uL (ref 0.1–0.9)
NEUTROS ABS: 3.4 10*3/uL (ref 1.5–6.5)
NEUTROS PCT: 56 %
Platelet Count: 279 10*3/uL (ref 145–400)
RBC: 4.65 MIL/uL (ref 3.70–5.32)
RDW: 14 % (ref 11.1–15.7)
WBC Count: 6 10*3/uL (ref 3.9–10.0)

## 2018-04-08 LAB — RETICULOCYTES
RBC.: 4.63 MIL/uL (ref 3.70–5.45)
RETIC CT PCT: 1.3 % (ref 0.7–2.1)
Retic Count, Absolute: 60.2 10*3/uL (ref 33.7–90.7)

## 2018-04-08 LAB — IRON AND TIBC
Iron: 89 ug/dL (ref 41–142)
SATURATION RATIOS: 23 % (ref 21–57)
TIBC: 396 ug/dL (ref 236–444)
UIBC: 307 ug/dL

## 2018-04-08 LAB — VITAMIN B12: Vitamin B-12: 254 pg/mL (ref 180–914)

## 2018-04-08 LAB — FERRITIN: Ferritin: 14 ng/mL (ref 9–269)

## 2018-04-08 MED ORDER — CYANOCOBALAMIN 1000 MCG/ML IJ SOLN
1000.0000 ug | Freq: Once | INTRAMUSCULAR | Status: AC
  Administered 2018-04-08: 1000 ug via INTRAMUSCULAR

## 2018-04-08 MED ORDER — CYANOCOBALAMIN 1000 MCG/ML IJ SOLN
INTRAMUSCULAR | Status: AC
  Filled 2018-04-08: qty 1

## 2018-04-08 NOTE — Progress Notes (Signed)
Hematology and Oncology Follow Up Visit  Sheri [name] [id number] 27-Jun-1976 42 y.o. 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 She is symptomatic with fatigue, chewing ice, chills and bruising easily.  She had strep throat and an ear infection 2 weeks ago and is feeling better. She still has some sinus congestion and drainage with a dry cough at times.  No fever, n/v, rash, dizziness, SOB, chest pain, palpitations, abdominal pain or changes in bowel or bladder habits.  She is on a chewable oral contraceptive and does not have a cycle.  No episodes of bleeding, no petechiae.  No lymphadenopathy noted on exam.  She had some puffiness in her hands several weeks ago but this has improved. No other swelling, no tenderness, numbness or tingling in her extremities. No c/o pain.  Her appetite comes and goes. She is staying well hydrated. Her weight is stable.   ECOG Performance Status: 1 - Symptomatic but completely ambulatory  Medications:  Allergies as of 04/08/2018      Reactions   Aspirin Other (See Comments)   Pt. Has had gastric bypass surgery.  ASA causes ulcers.  Can take Aleve.      Medication List        Accurate as of 04/08/18  8:47 AM. Always use your most recent med list.          ergocalciferol 50000 units capsule Commonly known as:  VITAMIN D2 Take 1 capsule (50,000 Units total) by mouth once a week.   montelukast 10 MG tablet Commonly known as:  SINGULAIR Take 1 tablet (10 mg total) by mouth at bedtime.   MULTIVITAMIN PO Take by mouth. Prenatal chewable-Target Brand   Norethindrone-Ethinyl Estradiol-Fe 0.8-25 MG-MCG tablet Commonly known as:  KAITLIB FE Chew 1 tablet daily by mouth.   VITAMIN B-12 IJ Inject  every 30 (thirty) days as directed.       Allergies:  Allergies  Allergen Reactions  . Aspirin Other (See Comments)    Pt. Has had gastric bypass surgery.  ASA causes ulcers.  Can take Aleve.    Past Medical History, Surgical history, Social history, and Family History were reviewed and updated.  Review of Systems: All other 10 point review of systems is negative.   Physical Exam:  vitals were not taken for this visit.   Wt Readings from Last 3 Encounters:  01/07/18 279 lb (126.6 kg)  10/08/17 271 lb (122.9 kg)  09/19/17 270 lb (122.5 kg)    Ocular: Sclerae unicteric, pupils equal, round and reactive to light Ear-nose-throat: Oropharynx clear, dentition fair Lymphatic: No cervical, supraclavicular or axillary adenopathy Lungs no rales or rhonchi, good excursion bilaterally Heart regular rate and rhythm, no murmur appreciated Abd soft, nontender, positive bowel sounds, no liver or spleen tip palpated on exam, no fluid wave  MSK no focal spinal tenderness, no joint edema Neuro: non-focal, well-oriented, appropriate affect Breasts: Deferred   Lab Results  Component Value Date   WBC 6.0 04/08/2018   HGB 13.1 04/08/2018   HCT 40.8 04/08/2018   MCV 87.7 04/08/2018   PLT 279 04/08/2018   Lab Results  Component Value Date   FERRITIN 23 01/07/2018   IRON 112 01/07/2018   TIBC 364 01/07/2018   UIBC  252 01/07/2018   IRONPCTSAT 31 01/07/2018   Lab Results  Component Value Date   RETICCTPCT 1.3 01/07/2018   RBC 4.65 04/08/2018   No results found for: KPAFRELGTCHN, LAMBDASER, KAPLAMBRATIO No results found for: IGGSERUM, IGA, IGMSERUM No results found for: Kathrynn Ducking, MSPIKE, SPEI   Chemistry      Component Value Date/Time   NA 142 01/07/2018 0816   NA 144 10/08/2017 0812   NA 140 07/10/2017 0838   K 3.6 01/07/2018 0816   K 3.9 10/08/2017 0812   K 3.6 07/10/2017 0838   CL 110 (H) 01/07/2018 0816   CL 109 (H)  10/08/2017 0812   CO2 25 01/07/2018 0816   CO2 25 10/08/2017 0812   CO2 23 07/10/2017 0838   BUN 10 01/07/2018 0816   BUN 9 10/08/2017 0812   BUN 9.0 07/10/2017 0838   CREATININE 0.90 01/07/2018 0816   CREATININE 0.8 10/08/2017 0812   CREATININE 0.8 07/10/2017 0838      Component Value Date/Time   CALCIUM 9.3 01/07/2018 0816   CALCIUM 9.1 10/08/2017 0812   CALCIUM 9.2 07/10/2017 0838   ALKPHOS 86 (H) 01/07/2018 0816   ALKPHOS 78 10/08/2017 0812   ALKPHOS 127 07/10/2017 0838   AST 26 01/07/2018 0816   AST 22 07/10/2017 0838   ALT 25 01/07/2018 0816   ALT 23 10/08/2017 0812   ALT 22 07/10/2017 0838   BILITOT 0.5 01/07/2018 0816   BILITOT 0.34 07/10/2017 0838      Impression and Plan: Sheri [name] is a very pleasant 42 yo African American female with iron, B-12 and Vit D deficiency 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 She is symptomatic with fatigue, chills, chewing ice and bruising easily but not in excess.  She received B 12 today as planned. She verbalized that she is taking her Vitamin D supplement weekly as prescribed.  We will see what her iron studies show and bring ger back in for infusion if needed.  We will go ahead and plan to see her back in another 3 months for follow-up.  She will contact our office with any questions or concerns. We can certainly see her sooner if need be.   Laverna Peace, NP 5/28/20198:47 AM

## 2018-04-09 LAB — VITAMIN D 25 HYDROXY (VIT D DEFICIENCY, FRACTURES): VIT D 25 HYDROXY: 37.1 ng/mL (ref 30.0–100.0)

## 2018-04-14 ENCOUNTER — Encounter: Payer: Self-pay | Admitting: Family

## 2018-05-08 ENCOUNTER — Ambulatory Visit (INDEPENDENT_AMBULATORY_CARE_PROVIDER_SITE_OTHER): Payer: 59 | Admitting: Obstetrics & Gynecology

## 2018-05-08 ENCOUNTER — Encounter: Payer: Self-pay | Admitting: Obstetrics & Gynecology

## 2018-05-08 ENCOUNTER — Other Ambulatory Visit: Payer: Self-pay

## 2018-05-08 VITALS — BP 126/82 | HR 72 | Resp 16 | Ht 66.0 in | Wt 279.0 lb

## 2018-05-08 DIAGNOSIS — Z01419 Encounter for gynecological examination (general) (routine) without abnormal findings: Secondary | ICD-10-CM

## 2018-05-08 MED ORDER — NORETHIN-ETH ESTRADIOL-FE 0.8-25 MG-MCG PO CHEW: 1.0000 | CHEWABLE_TABLET | Freq: Every day | ORAL | 4 refills | Status: DC

## 2018-05-08 NOTE — Progress Notes (Signed)
42 y.o. G2P0 SingleAfrican AmericanF here for annual exam.  Not having any bleeding with her OCPs.  Did have an iron infusion in May with hematology.  Thinks she may need another one.  She is now having B12 injections.  Has been experiencing more issues with bug bites.  At some point this year, though she had shingles but this was not the diagnosis.  Now she uses xyzal and her Singulair.    Patient's last menstrual period was 11/12/2013 (approximate).          Sexually active: No.  The current method of family planning is OCP (estrogen/progesterone).    Exercising: Yes.    not regular  Smoker:  no  Health Maintenance: Pap:  06/04/16 Neg. HR HPV:neg   04/02/14 Neg  History of abnormal Pap:  no MMG:  09/20/14 [place] right BIRADS2:Benign. F/u 1 year.  Will start MMG at 43 yearly using current ACS guidelines.   Colonoscopy:  Never BMD:   Never TDaP:  2012 Screening Labs: PCP   reports that she has never smoked. She has never used smokeless tobacco. She reports that she does not drink alcohol or use drugs.  Past Medical History:  Diagnosis Date  . Anemia    and low B12  . Heart murmur    as child - no problems as adult  . History of blood transfusion 2004   [facility], after surgery  . Hypothyroidism    history in past, resolved  . Inflammation of joint of knee    right knee-on anti-inflammatory, arthritis  . Seasonal allergies     Past Surgical History:  Procedure Laterality Date  . ABDOMINOPLASTY  2004  . BREAST REDUCTION SURGERY  2005  . GASTRIC BYPASS  2001   Dr Duwayne Heck, in [place]  . LAPAROSCOPIC OVARIAN CYSTECTOMY Right 05/17/2014   Procedure: LAPAROSCOPIC  partial  right salpingo-oohorectomy  and cystoscopy;  Surgeon: Lyman Speller, MD;  Location: [facility];  Service: Gynecology;  Laterality: Right;  . WISDOM TOOTH EXTRACTION  2014   upper right    Current Outpatient Medications  Medication Sig Dispense Refill  . clobetasol cream (TEMOVATE) 0.05 %  daily as needed.  3  . Cyanocobalamin (VITAMIN B-12 IJ) Inject every 30 (thirty) days as directed.    . ergocalciferol (VITAMIN D2) 50000 units capsule Take 1 capsule (50,000 Units total) by mouth once a week. 8 capsule 6  . montelukast (SINGULAIR) 10 MG tablet Take 1 tablet (10 mg total) by mouth at bedtime. 90 tablet 4  . Multiple Vitamins-Minerals (MULTIVITAMIN PO) Take by mouth. Prenatal chewable-Target Brand    . Norethindrone-Ethinyl Estradiol-Fe (KAITLIB FE) 0.8-25 MG-MCG tablet Chew 1 tablet daily by mouth. 84 tablet 4   No current facility-administered medications for this visit.     Family History  Problem Relation Age of Onset  . CVA Maternal Grandmother   . Hypertension Brother   . Diabetes Brother   . Heart disease Brother   . Diabetes Sister   . Hypertension Sister   . Diabetes Sister   . Hypertension Sister   . Diabetes Brother   . Hypertension Brother   . Heart disease Brother   . Diabetes Brother   . Hypertension Brother   . Heart disease Brother     Review of Systems  Constitutional:       Weight gain   Gastrointestinal:       Bloating   All other systems reviewed and are negative.   Exam:  BP 126/82 (BP Location: Right Arm, Patient Position: Sitting, Cuff Size: Large)   Pulse 72   Resp 16   Ht 5\' 6"  (1.676 m)   Wt 279 lb (126.6 kg)   LMP 11/12/2013 (Approximate)   BMI 45.03 kg/m    Height: 5\' 6"  (167.6 cm)  Ht Readings from Last 3 Encounters:  05/08/18 5\' 6"  (1.676 m)  09/19/17 5' 5.5" (1.664 m)  08/02/16 5' 5.5" (1.664 m)    General appearance: alert, cooperative and appears stated age Head: Normocephalic, without obvious abnormality, atraumatic Neck: no adenopathy, supple, symmetrical, trachea midline and thyroid normal to inspection and palpation Lungs: clear to auscultation bilaterally Breasts: normal appearance, no masses or tenderness Heart: regular rate and rhythm Abdomen: soft, non-tender; bowel sounds normal; no masses,  no  organomegaly Extremities: extremities normal, atraumatic, no cyanosis or edema Skin: Skin color, texture, turgor normal. No rashes or lesions Lymph nodes: Cervical, supraclavicular, and axillary nodes normal. No abnormal inguinal nodes palpated Neurologic: Grossly normal   Pelvic: External genitalia:  no lesions              Urethra:  normal appearing urethra with no masses, tenderness or lesions              Bartholins and Skenes: normal                 Vagina: normal appearing vagina with normal color and discharge, no lesions              Cervix: no lesions              Pap taken: No. Bimanual Exam:  Uterus:  normal size, contour, position, consistency, mobility, non-tender              Adnexa: normal adnexa and no mass, fullness, tenderness               Rectovaginal: Confirms               Anus:  normal sphincter tone, no lesions   Chaperone was present for exam.  A:  Well Woman with normal exam Amenorrhea on OCPs  Pernicious anemia Recent low ferritin and iron infusion in May Obesity  P:   Mammogram guidelines reviewed pap smear with neg HR HPV 2017.  Not indicated today. RF for OCPs.  #3 packs/4RF Considering revision of bariatric surgery return annually or prn

## 2018-07-08 ENCOUNTER — Inpatient Hospital Stay: Payer: 59

## 2018-07-08 ENCOUNTER — Inpatient Hospital Stay: Payer: 59 | Attending: Hematology & Oncology | Admitting: Hematology & Oncology

## 2018-07-08 ENCOUNTER — Other Ambulatory Visit: Payer: Self-pay

## 2018-07-08 VITALS — BP 122/85 | HR 57 | Temp 98.3°F | Resp 20 | Wt 276.2 lb

## 2018-07-08 DIAGNOSIS — Z9884 Bariatric surgery status: Secondary | ICD-10-CM | POA: Diagnosis not present

## 2018-07-08 DIAGNOSIS — E559 Vitamin D deficiency, unspecified: Secondary | ICD-10-CM

## 2018-07-08 DIAGNOSIS — K912 Postsurgical malabsorption, not elsewhere classified: Secondary | ICD-10-CM | POA: Diagnosis not present

## 2018-07-08 DIAGNOSIS — D510 Vitamin B12 deficiency anemia due to intrinsic factor deficiency: Secondary | ICD-10-CM

## 2018-07-08 DIAGNOSIS — D509 Iron deficiency anemia, unspecified: Secondary | ICD-10-CM | POA: Diagnosis not present

## 2018-07-08 DIAGNOSIS — Z79899 Other long term (current) drug therapy: Secondary | ICD-10-CM | POA: Diagnosis not present

## 2018-07-08 DIAGNOSIS — D508 Other iron deficiency anemias: Secondary | ICD-10-CM

## 2018-07-08 DIAGNOSIS — D500 Iron deficiency anemia secondary to blood loss (chronic): Secondary | ICD-10-CM

## 2018-07-08 LAB — CMP (CANCER CENTER ONLY)
ALBUMIN: 3.2 g/dL — AB (ref 3.5–5.0)
ALT: 23 U/L (ref 0–44)
ANION GAP: 7 (ref 5–15)
AST: 24 U/L (ref 15–41)
Alkaline Phosphatase: 89 U/L (ref 38–126)
BILIRUBIN TOTAL: 0.3 mg/dL (ref 0.3–1.2)
BUN: 14 mg/dL (ref 6–20)
CO2: 25 mmol/L (ref 22–32)
Calcium: 9 mg/dL (ref 8.9–10.3)
Chloride: 110 mmol/L (ref 98–111)
Creatinine: 0.78 mg/dL (ref 0.44–1.00)
GFR, Est AFR Am: >60 mL/min (ref >60)
GFR, Estimated: >60 mL/min (ref >60)
GLUCOSE: 87 mg/dL (ref 70–99)
POTASSIUM: 4 mmol/L (ref 3.5–5.1)
SODIUM: 142 mmol/L (ref 135–145)
TOTAL PROTEIN: 7.3 g/dL (ref 6.5–8.1)

## 2018-07-08 LAB — CBC WITH DIFFERENTIAL (CANCER CENTER ONLY)
BASOS ABS: 0 10*3/uL (ref 0.0–0.1)
BASOS PCT: 1 %
EOS ABS: 0.1 10*3/uL (ref 0.0–0.5)
Eosinophils Relative: 2 %
HEMATOCRIT: 40.7 % (ref 34.8–46.6)
Hemoglobin: 12.9 g/dL (ref 11.6–15.9)
Lymphocytes Relative: 29 %
Lymphs Abs: 1.7 10*3/uL (ref 0.9–3.3)
MCH: 27.5 pg (ref 26.0–34.0)
MCHC: 31.7 g/dL — AB (ref 32.0–36.0)
MCV: 86.8 fL (ref 81.0–101.0)
Monocytes Absolute: 0.7 10*3/uL (ref 0.1–0.9)
Monocytes Relative: 12 %
NEUTROS ABS: 3.5 10*3/uL (ref 1.5–6.5)
NEUTROS PCT: 56 %
Platelet Count: 269 10*3/uL (ref 145–400)
RBC: 4.69 MIL/uL (ref 3.70–5.32)
RDW: 14.9 % (ref 11.1–15.7)
WBC Count: 6.1 10*3/uL (ref 3.9–10.0)

## 2018-07-08 LAB — FERRITIN: Ferritin: 10 ng/mL — ABNORMAL LOW (ref 11–307)

## 2018-07-08 LAB — IRON AND TIBC
Iron: 73 ug/dL (ref 41–142)
Saturation Ratios: 18 % — ABNORMAL LOW (ref 21–57)
TIBC: 405 ug/dL (ref 236–444)
UIBC: 332 ug/dL

## 2018-07-08 LAB — VITAMIN B12: Vitamin B-12: 243 pg/mL (ref 180–914)

## 2018-07-08 MED ORDER — CYANOCOBALAMIN 1000 MCG/ML IJ SOLN
INTRAMUSCULAR | Status: AC
  Filled 2018-07-08: qty 1

## 2018-07-08 MED ORDER — CYANOCOBALAMIN 1000 MCG/ML IJ SOLN
1000.0000 ug | Freq: Once | INTRAMUSCULAR | Status: AC
  Administered 2018-07-08: 1000 ug via INTRAMUSCULAR

## 2018-07-08 NOTE — Patient Instructions (Signed)
Vitamin B12 and Folate Test Vitamin B12 and folate (folic acid) are both B vitamins that are needed to make red blood cells and keep your nervous system healthy. Having normal levels is important. You may be low in these B vitamins if you do not get enough of them in your diet. Folate is found in:  Leafy green vegetables.  Beans.  Fruits.  Grains and cereals that have had folate added (fortified).  Vitamin B12 is found in:  Meats.  Eggs.  Dairy products.  Fish.  Vitamin B12 fortified grains and cereals.  You may also have low levels of these vitamins if you have a digestive system disease that interferes with your ability to absorb them from your food. The most common cause of a vitamin B12 deficiency is the inability to absorb it (pernicious anemia). You may have this blood test if you have symptoms of a vitamin B12 or folate deficiency. These include:  Fatigue.  Headache.  Confusion.  Sore mouth.  Poor balance.  Tingling or numbness.  You may also have this test if:  You are pregnant or breastfeeding. Women who are pregnant or breastfeeding need more folate and may need to take supplements.  Your red blood cell count is low (anemia).  You are an older person and have mental confusion.  You have a disease or condition that may lead to vitamin B deficiency.  This test requires a blood sample taken from a vein in your hand or arm. The tests for vitamin B12 and folate may be done together or separately. How do I prepare for this test?  You may not be able to eat before the blood test as directed by your health care provider.  Tell your health care provider about: ? All medicines you are taking, including vitamins, herbs, eye drops, creams, and over-the-counter medicines, including medicine for heartburn. Many medicines can affect your B12 and folate levels. ? Medical conditions you have, including if you are or may be pregnant. ? How often you drink  alcohol. What do the results mean? It is your responsibility to obtain your test results. Ask the lab or department performing the test when and how you will get your results. Contact your health care provider to discuss any questions you have about your results. The results of this test will be reported as a range of values. Range of Normal Values Ranges for normal values vary among different labs and hospitals. You should always check with your health care provider after having lab work or other tests done to discuss whether your values are considered within normal limits.  The normal range for B12 is 160-950 pg/mL or 118-701 pmol/L (SI units).  The normal range for folate is 5-25 ng/mL or 11-57 mmol/L (SI units).  Meaning of Results Outside Normal Range Values High levels of vitamin B12 are rare, but may happen if you have:  Cancer.  Diabetes.  Heart failure.  Obesity.  Liver disease.  Human immunodeficiency virus (HIV).  It is more common to have low levels of vitamin B12 and folate. Talk to your health care provider about what your test results mean for you. Some common causes of low vitamin B12 or folate levels include:  Pernicious and other kinds of anemia.  Poor nutrition.  Alcoholism.  Liver disease.  Digestive disease.  Talk with your health care provider to discuss your results, treatment options, and if necessary, the need for more tests. Talk with your health care provider if you have   any questions about your results. This information is not intended to replace advice given to you by your health care provider. Make sure you discuss any questions you have with your health care provider. Document Released: 11/23/2004 Document Revised: 07/04/2016 Document Reviewed: 02/16/2014 Elsevier Interactive Patient Education  2018 Elsevier Inc[employer]  

## 2018-07-08 NOTE — Progress Notes (Signed)
Hematology and Oncology Follow Up Visit  Sheri [name] [id number] 08/24/1976 42 y.o. 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 is here today for follow-up. She is symptomatic with fatigue, chewing ice, chills and bruising easily.    No fever, n/v, rash, dizziness, SOB, chest pain, palpitations, abdominal pain or changes in bowel or bladder habits.   She is on a chewable oral contraceptive and does not have a cycle.   No episodes of bleeding, no petechiae.   She had some puffiness in her hands several weeks ago but this has improved. No other swelling, no tenderness, numbness or tingling in her extremities.  .  Her appetite comes and goes. She is staying well hydrated. Her weight is stable.   Overall, her performance status is ECOG 1.   Medications:  Allergies as of 07/08/2018      Reactions   Aspirin Other (See Comments)   Pt. Has had gastric bypass surgery.  ASA causes ulcers.  Can take Aleve.      Medication List        Accurate as of 07/08/18  8:35 AM. Always use your most recent med list.          clobetasol cream 0.05 % Commonly known as:  TEMOVATE daily as needed.   ergocalciferol 50000 units capsule Commonly known as:  VITAMIN D2 Take 1 capsule (50,000 Units total) by mouth once a week.   montelukast 10 MG tablet Commonly known as:  SINGULAIR Take 1 tablet (10 mg total) by mouth at bedtime.   MULTIVITAMIN PO Take by mouth. Prenatal chewable-Target Brand   Norethindrone-Ethinyl Estradiol-Fe 0.8-25 MG-MCG tablet Commonly known as:  GENERESSE Chew 1 tablet by mouth daily.   VITAMIN B-12 IJ Inject every 30 (thirty) days as directed.   Cyanocobalamin 1000 MCG/ML Kit Inject every 30 (thirty) days as directed.       Allergies:    Allergies  Allergen Reactions  . Aspirin Other (See Comments)    Pt. Has had gastric bypass surgery.  ASA causes ulcers.  Can take Aleve.    Past Medical History, Surgical history, Social history, and Family History were reviewed and updated.  Review of Systems: Review of Systems  Constitutional: Positive for malaise/fatigue.  HENT: Negative.   Eyes: Negative.   Respiratory: Negative.   Cardiovascular: Negative.   Gastrointestinal: Negative.   Genitourinary: Negative.   Musculoskeletal: Negative.   Skin: Negative.   Neurological: Negative.   Endo/Heme/Allergies: Negative.   Psychiatric/Behavioral: Negative.      Physical Exam:  weight is 276 lb 4 oz (125.3 kg). Her oral temperature is 98.3 F (36.8 C). Her blood pressure is 122/85 and her pulse is 57 (abnormal). Her respiration is 20 and oxygen saturation is 100%.   Wt Readings from Last 3 Encounters:  07/08/18 276 lb 4 oz (125.3 kg)  05/08/18 279 lb (126.6 kg)  04/08/18 273 lb 12 oz (124.2 kg)    Physical Exam  Constitutional: She is oriented to person, place, and time.  HENT:  Head: Normocephalic and atraumatic.  Mouth/Throat: Oropharynx is clear and moist.  Eyes: Pupils are equal, round, and reactive to light. EOM are normal.  Neck: Normal range of motion.  Cardiovascular: Normal rate, regular rhythm and normal heart sounds.  Pulmonary/Chest: Effort  normal and breath sounds normal.  Abdominal: Soft. Bowel sounds are normal.  Musculoskeletal: Normal range of motion. She exhibits no edema, tenderness or deformity.  Lymphadenopathy:    She has no cervical adenopathy.  Neurological: She is alert and oriented to person, place, and time.  Skin: Skin is warm and dry. No rash noted. No erythema.  Psychiatric: She has a normal mood and affect. Her behavior is normal. Judgment and thought content normal.  Vitals reviewed.    Lab Results  Component Value Date   WBC 6.1 07/08/2018   HGB 12.9 07/08/2018   HCT 40.7  07/08/2018   MCV 86.8 07/08/2018   PLT 269 07/08/2018   Lab Results  Component Value Date   FERRITIN 14 04/08/2018   IRON 89 04/08/2018   TIBC 396 04/08/2018   UIBC 307 04/08/2018   IRONPCTSAT 23 04/08/2018   Lab Results  Component Value Date   RETICCTPCT 1.3 04/08/2018   RBC 4.69 07/08/2018   No results found for: KPAFRELGTCHN, LAMBDASER, KAPLAMBRATIO No results found for: IGGSERUM, IGA, IGMSERUM No results found for: Kathrynn Ducking, MSPIKE, SPEI   Chemistry      Component Value Date/Time   NA 138 04/08/2018 0751   NA 144 10/08/2017 0812   NA 140 07/10/2017 0838   K 4.1 04/08/2018 0751   K 3.9 10/08/2017 0812   K 3.6 07/10/2017 0838   CL 111 (H) 04/08/2018 0751   CL 109 (H) 10/08/2017 0812   CO2 18 (L) 04/08/2018 0751   CO2 25 10/08/2017 0812   CO2 23 07/10/2017 0838   BUN 10 04/08/2018 0751   BUN 9 10/08/2017 0812   BUN 9.0 07/10/2017 0838   CREATININE 0.77 04/08/2018 0751   CREATININE 0.8 10/08/2017 0812   CREATININE 0.8 07/10/2017 0838      Component Value Date/Time   CALCIUM 8.9 04/08/2018 0751   CALCIUM 9.1 10/08/2017 0812   CALCIUM 9.2 07/10/2017 0838   ALKPHOS 81 04/08/2018 0751   ALKPHOS 78 10/08/2017 0812   ALKPHOS 127 07/10/2017 0838   AST 22 04/08/2018 0751   AST 22 07/10/2017 0838   ALT 23 04/08/2018 0751   ALT 23 10/08/2017 0812   ALT 22 07/10/2017 0838   BILITOT 0.3 04/08/2018 0751   BILITOT 0.34 07/10/2017 0838      Impression and Plan: 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   Her B12 injection will be given today.  I suspect that she will need iron.  Back in May, her ferritin was only 14.  Unfortunately, because of insurance, we cannot use that ferritin to give iron today.  I will plan to see her back the first week in November.  I want to make sure that her labs and iron will be okay for  the holidays upcoming.    Volanda Napoleon, MD 8/27/20198:35 AM

## 2018-07-09 LAB — VITAMIN D 25 HYDROXY (VIT D DEFICIENCY, FRACTURES): Vit D, 25-Hydroxy: 30.1 ng/mL (ref 30.0–100.0)

## 2018-07-10 ENCOUNTER — Encounter: Payer: Self-pay | Admitting: Hematology & Oncology

## 2018-07-11 ENCOUNTER — Inpatient Hospital Stay: Payer: 59

## 2018-07-11 ENCOUNTER — Other Ambulatory Visit: Payer: Self-pay

## 2018-07-11 VITALS — BP 106/64 | HR 60 | Temp 98.0°F | Resp 20

## 2018-07-11 DIAGNOSIS — D509 Iron deficiency anemia, unspecified: Secondary | ICD-10-CM | POA: Diagnosis not present

## 2018-07-11 DIAGNOSIS — D508 Other iron deficiency anemias: Secondary | ICD-10-CM

## 2018-07-11 MED ORDER — SODIUM CHLORIDE 0.9 % IV SOLN
510.0000 mg | Freq: Once | INTRAVENOUS | Status: AC
  Administered 2018-07-11: 510 mg via INTRAVENOUS
  Filled 2018-07-11: qty 17

## 2018-07-11 MED ORDER — SODIUM CHLORIDE 0.9 % IV SOLN
Freq: Once | INTRAVENOUS | Status: AC
  Administered 2018-07-11: 12:00:00 via INTRAVENOUS
  Filled 2018-07-11: qty 250

## 2018-07-11 NOTE — Patient Instructions (Signed)

## 2018-09-16 ENCOUNTER — Other Ambulatory Visit: Payer: Self-pay

## 2018-09-16 ENCOUNTER — Ambulatory Visit: Payer: Self-pay

## 2018-09-16 ENCOUNTER — Ambulatory Visit: Payer: Self-pay | Admitting: Hematology & Oncology

## 2018-09-30 ENCOUNTER — Inpatient Hospital Stay: Payer: 59 | Attending: Hematology & Oncology

## 2018-09-30 ENCOUNTER — Inpatient Hospital Stay: Payer: 59

## 2018-09-30 ENCOUNTER — Inpatient Hospital Stay (HOSPITAL_BASED_OUTPATIENT_CLINIC_OR_DEPARTMENT_OTHER): Payer: 59 | Admitting: Hematology & Oncology

## 2018-09-30 ENCOUNTER — Encounter: Payer: Self-pay | Admitting: *Deleted

## 2018-09-30 VITALS — BP 132/54 | HR 56 | Temp 98.2°F | Resp 18 | Wt 271.5 lb

## 2018-09-30 DIAGNOSIS — K912 Postsurgical malabsorption, not elsewhere classified: Secondary | ICD-10-CM | POA: Diagnosis not present

## 2018-09-30 DIAGNOSIS — D509 Iron deficiency anemia, unspecified: Secondary | ICD-10-CM | POA: Insufficient documentation

## 2018-09-30 DIAGNOSIS — Z9884 Bariatric surgery status: Secondary | ICD-10-CM | POA: Insufficient documentation

## 2018-09-30 DIAGNOSIS — D510 Vitamin B12 deficiency anemia due to intrinsic factor deficiency: Secondary | ICD-10-CM

## 2018-09-30 DIAGNOSIS — D500 Iron deficiency anemia secondary to blood loss (chronic): Secondary | ICD-10-CM

## 2018-09-30 DIAGNOSIS — E559 Vitamin D deficiency, unspecified: Secondary | ICD-10-CM

## 2018-09-30 LAB — CMP (CANCER CENTER ONLY)
ALT: 24 U/L (ref 10–47)
ANION GAP: 6 (ref 5–15)
AST: 29 U/L (ref 11–38)
Albumin: 3.2 g/dL — ABNORMAL LOW (ref 3.5–5.0)
Alkaline Phosphatase: 85 U/L — ABNORMAL HIGH (ref 26–84)
BUN: 12 mg/dL (ref 7–22)
CO2: 26 mmol/L (ref 18–33)
Calcium: 9.4 mg/dL (ref 8.0–10.3)
Chloride: 111 mmol/L — ABNORMAL HIGH (ref 98–108)
Creatinine: 0.6 mg/dL (ref 0.60–1.20)
Glucose, Bld: 93 mg/dL (ref 73–118)
Potassium: 4.2 mmol/L (ref 3.3–4.7)
Sodium: 143 mmol/L (ref 128–145)
TOTAL PROTEIN: 7.5 g/dL (ref 6.4–8.1)
Total Bilirubin: 0.5 mg/dL (ref 0.2–1.6)

## 2018-09-30 LAB — CBC WITH DIFFERENTIAL (CANCER CENTER ONLY)
ABS IMMATURE GRANULOCYTES: 0.01 10*3/uL (ref 0.00–0.07)
BASOS PCT: 1 %
Basophils Absolute: 0.1 10*3/uL (ref 0.0–0.1)
EOS ABS: 0.1 10*3/uL (ref 0.0–0.5)
Eosinophils Relative: 1 %
HEMATOCRIT: 42.5 % (ref 36.0–46.0)
Hemoglobin: 13.1 g/dL (ref 12.0–15.0)
IMMATURE GRANULOCYTES: 0 %
LYMPHS ABS: 2 10*3/uL (ref 0.7–4.0)
Lymphocytes Relative: 34 %
MCH: 27 pg (ref 26.0–34.0)
MCHC: 30.8 g/dL (ref 30.0–36.0)
MCV: 87.6 fL (ref 80.0–100.0)
MONOS PCT: 11 %
Monocytes Absolute: 0.6 10*3/uL (ref 0.1–1.0)
NEUTROS ABS: 3.1 10*3/uL (ref 1.7–7.7)
Neutrophils Relative %: 53 %
PLATELETS: 293 10*3/uL (ref 150–400)
RBC: 4.85 MIL/uL (ref 3.87–5.11)
RDW: 13.6 % (ref 11.5–15.5)
WBC Count: 5.9 10*3/uL (ref 4.0–10.5)
nRBC: 0 % (ref 0.0–0.2)

## 2018-09-30 LAB — IRON AND TIBC
Iron: 113 ug/dL (ref 41–142)
Saturation Ratios: 35 % (ref 21–57)
TIBC: 321 ug/dL (ref 236–444)
UIBC: 209 ug/dL (ref 120–384)

## 2018-09-30 LAB — FERRITIN: Ferritin: 66 ng/mL (ref 11–307)

## 2018-09-30 LAB — VITAMIN B12: Vitamin B-12: 357 pg/mL (ref 180–914)

## 2018-09-30 MED ORDER — CYANOCOBALAMIN 1000 MCG/ML IJ SOLN
1000.0000 ug | Freq: Once | INTRAMUSCULAR | Status: AC
  Administered 2018-09-30: 1000 ug via INTRAMUSCULAR

## 2018-09-30 NOTE — Patient Instructions (Signed)
Cyanocobalamin, Vitamin B12 injection Qu es este medicamento? La CIANOCOBALAMINA es una forma de vitamina B12 artificial. La vitamina B12 se utiliza para el desarrollo de clulas sanguneas, clulas nerviosas y protenas saludables en el cuerpo. Tambin ayuda con el metabolizacin normal de grasas y carbohidratos. Este medicamento se [place] para tratar [employer] no pueden absorber la vitamina B12. Este medicamento puede ser utilizado para otros usos; si tiene alguna pregunta consulte con su proveedor de atencin mdica o con su farmacutico. MARCAS COMUNES: B-12 Compliance Kit, B-12 Injection Kit, Cyomin, LA-12, Nutri-Twelve, Physicians EZ Use B-12, Primabalt Qu le debo informar a mi profesional de la salud antes de tomar este medicamento? Necesita saber si usted presenta alguno de los [employer] o situaciones: -enfermedad renal -enfermedad de Leber -anemia megaloblstica -una reaccin alrgica o inusual a la cianocobalamina, al cobalto, a otros medicamentos, alimentos, colorantes o conservantes -si est embarazada o buscando quedar embarazada -si est amamantando a un beb Cmo debo utilizar este medicamento? Este medicamento se administra mediante inyeccin por va intramuscular o por va subcutnea profunda. Por lo general, lo administra un profesional de Technical sales engineer en un hospital o en un entorno clnico. Sin embargo, su mdico puede ensearle cmo administrarse sus propias inyecciones. Siga todas las instrucciones. Hable con su pediatra para informarse acerca del uso de este medicamento en nios. Puede requerir atencin especial. Sobredosis: Pngase en contacto inmediatamente con un centro toxicolgico o una sala de urgencia si usted cree que haya tomado demasiado medicamento. ATENCIN: [employer] es solo para usted. No comparta este medicamento con nadie. Qu sucede si me olvido de una dosis? Si recibe sus dosis en un entorno clnico o una oficina de mdico,  comunquese para programar otra cita. Si se administra las inyecciones usted mismo y [place] dosis, sela lo antes posible. Si es casi la hora de la prxima dosis, use slo esa dosis. No use dosis adicionales o dobles. Qu puede interactuar con este medicamento? -colchicina -consumir mucho alcohol Puede ser que esta lista no menciona todas las posibles interacciones. Informe a su profesional de [employer]	[place] de [employer] productos a base de hierbas, medicamentos de [employer] o suplementos nutritivos que est tomando. Si usted fuma, consume bebidas alcohlicas o si utiliza drogas ilegales, indqueselo tambin a su profesional de [employer]	[place]. Algunas sustancias pueden interactuar con su medicamento. A qu debo estar atento al usar [employer]? Visite a su mdico o a su profesional de Technical brewer. Tal vez necesitar realizarse [employer] de sangre mientras reciba este medicamento. Puede ser necesario seguir una dieta. Consulte a su mdico acerca de esto. Para obtener los [employer], limite su consumo de alcohol y evite fumar. Qu efectos secundarios puedo tener al [employer] este medicamento? Efectos secundarios que debe informar a su mdico o a Barrister's clerk de la salud tan pronto como sea posible: -Chief of Staff como erupcin cutnea, picazn o urticarias, hinchazn de la cara, labios o lengua -color azulado de la piel -dolor, opresin en el pecho -dificultad al respirar, sibilancias -mareos -rea enrojecido, hinchazn doloroso de la piel Efectos secundarios que, por lo general, no requieren Geophysical data processor (debe informarlos a su mdico o a Barrister's clerk de la salud si persisten o si son molestos): -diarrea -dolor de cabeza Puede ser que esta lista no menciona todos los posibles efectos secundarios. Comunquese a su mdico por asesoramiento mdico [employer]. Usted puede informar los efectos secundarios a la FDA por telfono al  1-800-FDA-1088. Dnde debo guardar mi medicina?  Mantngala fuera del alcance de los nios. Gurdela a [employer], entre 15 y 12 grados C (66 y 15 grados F). Protjala de la luz. Deseche todo el medicamento que no haya utilizado, despus de la fecha de vencimiento. ATENCIN: Este folleto es un resumen. Puede ser que no cubra toda la posible informacin. Si usted tiene preguntas acerca de esta medicina, consulte con su mdico, su farmacutico o su profesional de Technical sales engineer.  2018 Elsevier/Gold Standard (2014-12-21 00:00:00)

## 2018-09-30 NOTE — Progress Notes (Signed)
Hematology and Oncology Follow Up Visit  Sheri [name] [id number] 1976-11-08 42 y.o. 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  She is doing better.  More last saw her in August, her iron studies showed a ferritin of only 10 with an iron saturation of 18%.  We did go ahead and give her a dose of IV iron.  We will have to see what her iron studies look like today.  Overall, she is had no complaints.  She is had no fatigue.  She has had no bleeding.  There is been no nausea or vomiting.  She has had no change in bowel or bladder habits.  She has had no leg swelling.  She is had no change in medications.  Para she is trying to drink a lot of water.  She is trying to watch her salt intake.  Overall, her performance status is ECOG 1.   Medications:  Allergies as of 09/30/2018      Reactions   Aspirin Other (See Comments)   Pt. Has had gastric bypass surgery.  ASA causes ulcers.  Can take Aleve.      Medication List        Accurate as of 09/30/18  8:09 AM. Always use your most recent med list.          clobetasol cream 0.05 % Commonly known as:  TEMOVATE daily as needed.   ergocalciferol 1.25 MG (50000 UT) capsule Commonly known as:  VITAMIN D2 Take 1 capsule (50,000 Units total) by mouth once a week.   montelukast 10 MG tablet Commonly known as:  SINGULAIR Take 1 tablet (10 mg total) by mouth at bedtime.   MULTIVITAMIN PO Take by mouth. Prenatal chewable-Target Brand   Norethindrone-Ethinyl Estradiol-Fe 0.8-25 MG-MCG tablet Commonly known as:  GENERESSE Chew 1 tablet by mouth daily.   VITAMIN B-12 IJ Inject every 30 (thirty) days as directed.   Cyanocobalamin 1000 MCG/ML Kit Inject every 30 (thirty) days as  directed.       Allergies:  Allergies  Allergen Reactions  . Aspirin Other (See Comments)    Pt. Has had gastric bypass surgery.  ASA causes ulcers.  Can take Aleve.    Past Medical History, Surgical history, Social history, and Family History were reviewed and updated.  Review of Systems: Review of Systems  Constitutional: Positive for malaise/fatigue.  HENT: Negative.   Eyes: Negative.   Respiratory: Negative.   Cardiovascular: Negative.   Gastrointestinal: Negative.   Genitourinary: Negative.   Musculoskeletal: Negative.   Skin: Negative.   Neurological: Negative.   Endo/Heme/Allergies: Negative.   Psychiatric/Behavioral: Negative.      Physical Exam:  weight is 271 lb 8 oz (123.2 kg). Her oral temperature is 98.2 F (36.8 C). Her blood pressure is 132/54 (abnormal) and her pulse is 56 (abnormal). Her respiration is 18 and oxygen saturation is 100%.   Wt Readings from Last 3 Encounters:  09/30/18 271 lb 8 oz (123.2 kg)  07/08/18 276 lb 4 oz (125.3 kg)  05/08/18 279 lb (126.6 kg)    Physical Exam  Constitutional: She is oriented to person, place, and time.  HENT:  Head: Normocephalic and atraumatic.  Mouth/Throat: Oropharynx is clear and moist.  Eyes: Pupils are  equal, round, and reactive to light. EOM are normal.  Neck: Normal range of motion.  Cardiovascular: Normal rate, regular rhythm and normal heart sounds.  Pulmonary/Chest: Effort normal and breath sounds normal.  Abdominal: Soft. Bowel sounds are normal.  Musculoskeletal: Normal range of motion. She exhibits no edema, tenderness or deformity.  Lymphadenopathy:    She has no cervical adenopathy.  Neurological: She is alert and oriented to person, place, and time.  Skin: Skin is warm and dry. No rash noted. No erythema.  Psychiatric: She has a normal mood and affect. Her behavior is normal. Judgment and thought content normal.  Vitals reviewed.    Lab Results  Component Value Date   WBC 5.9  09/30/2018   HGB 13.1 09/30/2018   HCT 42.5 09/30/2018   MCV 87.6 09/30/2018   PLT 293 09/30/2018   Lab Results  Component Value Date   FERRITIN 10 (L) 07/08/2018   IRON 73 07/08/2018   TIBC 405 07/08/2018   UIBC 332 07/08/2018   IRONPCTSAT 18 (L) 07/08/2018   Lab Results  Component Value Date   RETICCTPCT 1.3 04/08/2018   RBC 4.85 09/30/2018   No results found for: KPAFRELGTCHN, LAMBDASER, KAPLAMBRATIO No results found for: IGGSERUM, IGA, IGMSERUM No results found for: Odetta Pink, SPEI   Chemistry      Component Value Date/Time   NA 142 07/08/2018 0815   NA 144 10/08/2017 0812   NA 140 07/10/2017 0838   K 4.0 07/08/2018 0815   K 3.9 10/08/2017 0812   K 3.6 07/10/2017 0838   CL 110 07/08/2018 0815   CL 109 (H) 10/08/2017 0812   CO2 25 07/08/2018 0815   CO2 25 10/08/2017 0812   CO2 23 07/10/2017 0838   BUN 14 07/08/2018 0815   BUN 9 10/08/2017 0812   BUN 9.0 07/10/2017 0838   CREATININE 0.78 07/08/2018 0815   CREATININE 0.8 10/08/2017 0812   CREATININE 0.8 07/10/2017 0838      Component Value Date/Time   CALCIUM 9.0 07/08/2018 0815   CALCIUM 9.1 10/08/2017 0812   CALCIUM 9.2 07/10/2017 0838   ALKPHOS 89 07/08/2018 0815   ALKPHOS 78 10/08/2017 0812   ALKPHOS 127 07/10/2017 0838   AST 24 07/08/2018 0815   AST 22 07/10/2017 0838   ALT 23 07/08/2018 0815   ALT 23 10/08/2017 0812   ALT 22 07/10/2017 0838   BILITOT 0.3 07/08/2018 0815   BILITOT 0.34 07/10/2017 0838      Impression and Plan: Sheri [name] is a very pleasant 42 yo African American female with iron, B-12 and Vit D deficiency 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   I know that she will enjoy the upcoming holidays.  I know that she will be with family.  This was makes her happy.  I will plan to see her back in the springtime.  I think this would be very reasonable.    Volanda Napoleon, MD 11/19/20198:09 AM

## 2018-10-05 ENCOUNTER — Other Ambulatory Visit: Payer: Self-pay | Admitting: Obstetrics & Gynecology

## 2019-01-27 ENCOUNTER — Encounter: Payer: Self-pay | Admitting: Hematology & Oncology

## 2019-01-27 ENCOUNTER — Inpatient Hospital Stay: Payer: 59

## 2019-01-27 ENCOUNTER — Inpatient Hospital Stay: Payer: 59 | Attending: Hematology & Oncology | Admitting: Hematology & Oncology

## 2019-01-27 ENCOUNTER — Other Ambulatory Visit: Payer: Self-pay

## 2019-01-27 VITALS — BP 129/80 | HR 58 | Temp 98.7°F | Resp 18 | Wt 266.0 lb

## 2019-01-27 DIAGNOSIS — Z79899 Other long term (current) drug therapy: Secondary | ICD-10-CM | POA: Diagnosis not present

## 2019-01-27 DIAGNOSIS — D510 Vitamin B12 deficiency anemia due to intrinsic factor deficiency: Secondary | ICD-10-CM | POA: Diagnosis not present

## 2019-01-27 DIAGNOSIS — D500 Iron deficiency anemia secondary to blood loss (chronic): Secondary | ICD-10-CM

## 2019-01-27 DIAGNOSIS — K909 Intestinal malabsorption, unspecified: Secondary | ICD-10-CM | POA: Diagnosis not present

## 2019-01-27 DIAGNOSIS — D509 Iron deficiency anemia, unspecified: Secondary | ICD-10-CM | POA: Diagnosis present

## 2019-01-27 DIAGNOSIS — Z9884 Bariatric surgery status: Secondary | ICD-10-CM | POA: Diagnosis not present

## 2019-01-27 LAB — CMP (CANCER CENTER ONLY)
ALBUMIN: 3.8 g/dL (ref 3.5–5.0)
ALT: 16 U/L (ref 0–44)
AST: 17 U/L (ref 15–41)
Alkaline Phosphatase: 79 U/L (ref 38–126)
Anion gap: 7 (ref 5–15)
BUN: 11 mg/dL (ref 6–20)
CHLORIDE: 108 mmol/L (ref 98–111)
CO2: 24 mmol/L (ref 22–32)
Calcium: 8.8 mg/dL — ABNORMAL LOW (ref 8.9–10.3)
Creatinine: 0.79 mg/dL (ref 0.44–1.00)
GFR, Est AFR Am: >60 mL/min (ref >60)
GFR, Estimated: >60 mL/min (ref >60)
GLUCOSE: 90 mg/dL (ref 70–99)
POTASSIUM: 4 mmol/L (ref 3.5–5.1)
SODIUM: 139 mmol/L (ref 135–145)
Total Bilirubin: 0.4 mg/dL (ref 0.3–1.2)
Total Protein: 6.9 g/dL (ref 6.5–8.1)

## 2019-01-27 LAB — CBC WITH DIFFERENTIAL (CANCER CENTER ONLY)
ABS IMMATURE GRANULOCYTES: 0.01 10*3/uL (ref 0.00–0.07)
BASOS ABS: 0.1 10*3/uL (ref 0.0–0.1)
BASOS PCT: 1 %
EOS ABS: 0.1 10*3/uL (ref 0.0–0.5)
Eosinophils Relative: 2 %
HCT: 39.6 % (ref 36.0–46.0)
Hemoglobin: 12.6 g/dL (ref 12.0–15.0)
Immature Granulocytes: 0 %
Lymphocytes Relative: 30 %
Lymphs Abs: 1.9 10*3/uL (ref 0.7–4.0)
MCH: 28.1 pg (ref 26.0–34.0)
MCHC: 31.8 g/dL (ref 30.0–36.0)
MCV: 88.2 fL (ref 80.0–100.0)
Monocytes Absolute: 0.6 10*3/uL (ref 0.1–1.0)
Monocytes Relative: 10 %
NEUTROS ABS: 3.7 10*3/uL (ref 1.7–7.7)
NRBC: 0 % (ref 0.0–0.2)
Neutrophils Relative %: 57 %
PLATELETS: 254 10*3/uL (ref 150–400)
RBC: 4.49 MIL/uL (ref 3.87–5.11)
RDW: 14 % (ref 11.5–15.5)
WBC Count: 6.4 10*3/uL (ref 4.0–10.5)

## 2019-01-27 LAB — FERRITIN: FERRITIN: 38 ng/mL (ref 11–307)

## 2019-01-27 LAB — IRON AND TIBC
Iron: 102 ug/dL (ref 41–142)
SATURATION RATIOS: 31 % (ref 21–57)
TIBC: 326 ug/dL (ref 236–444)
UIBC: 224 ug/dL (ref 120–384)

## 2019-01-27 LAB — VITAMIN B12: Vitamin B-12: 229 pg/mL (ref 180–914)

## 2019-01-27 MED ORDER — CYANOCOBALAMIN 1000 MCG/ML IJ SOLN
INTRAMUSCULAR | Status: AC
  Filled 2019-01-27: qty 1

## 2019-01-27 MED ORDER — CYANOCOBALAMIN 1000 MCG/ML IJ SOLN
1000.0000 ug | Freq: Once | INTRAMUSCULAR | Status: AC
  Administered 2019-01-27: 1000 ug via INTRAMUSCULAR

## 2019-01-27 NOTE — Patient Instructions (Signed)
Cyanocobalamin, Vitamin B12 injection What is this medicine? CYANOCOBALAMIN (sye an oh koe BAL a min) is a man made form of vitamin B12. Vitamin B12 is used in the growth of healthy blood cells, nerve cells, and proteins in the body. It also helps with the metabolism of fats and carbohydrates. This medicine is used to treat people who can not absorb vitamin B12. This medicine may be used for other purposes; ask your health care provider or pharmacist if you have questions. COMMON BRAND NAME(S): B-12 Compliance Kit, B-12 Injection Kit, Cyomin, LA-12, Nutri-Twelve, Physicians EZ Use B-12, Primabalt What should I tell my health care provider before I take this medicine? They need to know if you have any of these conditions: -kidney disease -Leber's disease -megaloblastic anemia -an unusual or allergic reaction to cyanocobalamin, cobalt, other medicines, foods, dyes, or preservatives -pregnant or trying to get pregnant -breast-feeding How should I use this medicine? This medicine is injected into a muscle or deeply under the skin. It is usually given by a health care professional in a clinic or doctor's office. However, your doctor may teach you how to inject yourself. Follow all instructions. Talk to your pediatrician regarding the use of this medicine in children. Special care may be needed. Overdosage: If you think you have taken too much of this medicine contact a poison control center or emergency room at once. NOTE: This medicine is only for you. Do not share this medicine with others. What if I miss a dose? If you are given your dose at a clinic or doctor's office, call to reschedule your appointment. If you give your own injections and you miss a dose, take it as soon as you can. If it is almost time for your next dose, take only that dose. Do not take double or extra doses. What may interact with this medicine? -colchicine -heavy alcohol intake This list may not describe all possible  interactions. Give your health care provider a list of all the medicines, herbs, non-prescription drugs, or dietary supplements you use. Also tell them if you smoke, drink alcohol, or use illegal drugs. Some items may interact with your medicine. What should I watch for while using this medicine? Visit your doctor or health care professional regularly. You may need blood work done while you are taking this medicine. You may need to follow a special diet. Talk to your doctor. Limit your alcohol intake and avoid smoking to get the best benefit. What side effects may I notice from receiving this medicine? Side effects that you should report to your doctor or health care professional as soon as possible: -allergic reactions like skin rash, itching or hives, swelling of the face, lips, or tongue -blue tint to skin -chest tightness, pain -difficulty breathing, wheezing -dizziness -red, swollen painful area on the leg Side effects that usually do not require medical attention (report to your doctor or health care professional if they continue or are bothersome): -diarrhea -headache This list may not describe all possible side effects. Call your doctor for medical advice about side effects. You may report side effects to FDA at 1-800-FDA-1088. Where should I keep my medicine? Keep out of the reach of children. Store at room temperature between 15 and 30 degrees C (59 and 85 degrees F). Protect from light. Throw away any unused medicine after the expiration date. NOTE: This sheet is a summary. It may not cover all possible information. If you have questions about this medicine, talk to your doctor, pharmacist, or   health care provider.  2019 Elsevier/Gold Standard (2008-02-09 22:10:20)  

## 2019-01-27 NOTE — Progress Notes (Signed)
Hematology and Oncology Follow Up Visit  Sheri [name] [id number] 09/07/1976 43 y.o. 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  She really is doing well.  She is incredibly busy at her work because of the coronavirus.  She is getting a lot of calls about the coronavirus.  She had her iron studies last done back in November.  Her ferritin was 66 with an iron saturation of 35%.  She is still getting B12.  I think she gets this monthly.  She is had no issues with bleeding.  There is been no change in bowel or bladder habits.  She does not feel too fatigued.  She has a little bit more stamina.  There is been no rashes.  Overall, her performance status is ECOG 0.  Medications:  Allergies as of 01/27/2019      Reactions   Aspirin Other (See Comments)   Pt. Has had gastric bypass surgery.  ASA causes ulcers.  Can take Aleve.      Medication List       Accurate as of January 27, 2019  8:11 AM. Always use your most recent med list.        clobetasol cream 0.05 % Commonly known as:  TEMOVATE daily as needed.   ergocalciferol 1.25 MG (50000 UT) capsule Commonly known as:  VITAMIN D2 Take 1 capsule (50,000 Units total) by mouth once a week.   montelukast 10 MG tablet Commonly known as:  SINGULAIR Take 1 tablet (10 mg total) by mouth at bedtime.   MULTIVITAMIN PO Take by mouth. Prenatal chewable-Target Brand   Norethindrone-Ethinyl Estradiol-Fe 0.8-25 MG-MCG tablet Commonly known as:  Kaitlib Fe Chew 1 tablet by mouth daily.   VITAMIN B-12 IJ Inject every 30 (thirty) days as directed.   Cyanocobalamin 1000 MCG/ML Kit Inject every 30 (thirty) days as directed.       Allergies:  Allergies  Allergen Reactions  .  Aspirin Other (See Comments)    Pt. Has had gastric bypass surgery.  ASA causes ulcers.  Can take Aleve.    Past Medical History, Surgical history, Social history, and Family History were reviewed and updated.  Review of Systems: Review of Systems  Constitutional: Positive for malaise/fatigue.  HENT: Negative.   Eyes: Negative.   Respiratory: Negative.   Cardiovascular: Negative.   Gastrointestinal: Negative.   Genitourinary: Negative.   Musculoskeletal: Negative.   Skin: Negative.   Neurological: Negative.   Endo/Heme/Allergies: Negative.   Psychiatric/Behavioral: Negative.      Physical Exam:  vitals were not taken for this visit.   Wt Readings from Last 3 Encounters:  09/30/18 271 lb 8 oz (123.2 kg)  07/08/18 276 lb 4 oz (125.3 kg)  05/08/18 279 lb (126.6 kg)    Physical Exam Vitals signs reviewed.  HENT:     Head: Normocephalic and atraumatic.  Eyes:     Pupils: Pupils are equal, round, and reactive to light.  Neck:     Musculoskeletal: Normal range of motion.  Cardiovascular:     Rate and Rhythm: Normal rate and regular rhythm.     Heart sounds: Normal heart sounds.  Pulmonary:     Effort: Pulmonary effort is normal.     Breath sounds: Normal breath  sounds.  Abdominal:     General: Bowel sounds are normal.     Palpations: Abdomen is soft.  Musculoskeletal: Normal range of motion.        General: No tenderness or deformity.  Lymphadenopathy:     Cervical: No cervical adenopathy.  Skin:    General: Skin is warm and dry.     Findings: No erythema or rash.  Neurological:     Mental Status: She is alert and oriented to person, place, and time.  Psychiatric:        Behavior: Behavior normal.        Thought Content: Thought content normal.        Judgment: Judgment normal.      Lab Results  Component Value Date   WBC 6.4 01/27/2019   HGB 12.6 01/27/2019   HCT 39.6 01/27/2019   MCV 88.2 01/27/2019   PLT 254 01/27/2019   Lab Results  Component  Value Date   FERRITIN 66 09/30/2018   IRON 113 09/30/2018   TIBC 321 09/30/2018   UIBC 209 09/30/2018   IRONPCTSAT 35 09/30/2018   Lab Results  Component Value Date   RETICCTPCT 1.3 04/08/2018   RBC 4.49 01/27/2019   No results found for: KPAFRELGTCHN, LAMBDASER, KAPLAMBRATIO No results found for: IGGSERUM, IGA, IGMSERUM No results found for: Kathrynn Ducking, MSPIKE, SPEI   Chemistry      Component Value Date/Time   NA 143 09/30/2018 0744   NA 144 10/08/2017 0812   NA 140 07/10/2017 0838   K 4.2 09/30/2018 0744   K 3.9 10/08/2017 0812   K 3.6 07/10/2017 0838   CL 111 (H) 09/30/2018 0744   CL 109 (H) 10/08/2017 0812   CO2 26 09/30/2018 0744   CO2 25 10/08/2017 0812   CO2 23 07/10/2017 0838   BUN 12 09/30/2018 0744   BUN 9 10/08/2017 0812   BUN 9.0 07/10/2017 0838   CREATININE 0.60 09/30/2018 0744   CREATININE 0.8 10/08/2017 0812   CREATININE 0.8 07/10/2017 0838      Component Value Date/Time   CALCIUM 9.4 09/30/2018 0744   CALCIUM 9.1 10/08/2017 0812   CALCIUM 9.2 07/10/2017 0838   ALKPHOS 85 (H) 09/30/2018 0744   ALKPHOS 78 10/08/2017 0812   ALKPHOS 127 07/10/2017 0838   AST 29 09/30/2018 0744   AST 22 07/10/2017 0838   ALT 24 09/30/2018 0744   ALT 23 10/08/2017 0812   ALT 22 07/10/2017 0838   BILITOT 0.5 09/30/2018 0744   BILITOT 0.34 07/10/2017 0838      Impression and Plan: Sheri [name] is a very pleasant 43 yo African American female with iron, B-12 and Vit D deficiency 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   For right now, we will give her the B12 injection.  I doubt that we have to get her iron infusion.  I think her iron levels should be okay given the MCV of 88.  We will have her come back to see [place] in another 3-4 months.  I think we probably need to make sure she comes back in between visits for a B12 injection.    Volanda Napoleon, MD 3/17/20208:11 AM

## 2019-01-28 ENCOUNTER — Encounter: Payer: Self-pay | Admitting: *Deleted

## 2019-03-31 ENCOUNTER — Other Ambulatory Visit: Payer: Self-pay

## 2019-03-31 ENCOUNTER — Other Ambulatory Visit: Payer: Self-pay | Admitting: Family

## 2019-03-31 ENCOUNTER — Inpatient Hospital Stay: Payer: 59 | Attending: Hematology & Oncology

## 2019-03-31 VITALS — BP 111/76 | HR 56 | Temp 98.0°F | Resp 17 | Wt 267.8 lb

## 2019-03-31 DIAGNOSIS — D510 Vitamin B12 deficiency anemia due to intrinsic factor deficiency: Secondary | ICD-10-CM | POA: Insufficient documentation

## 2019-03-31 DIAGNOSIS — E559 Vitamin D deficiency, unspecified: Secondary | ICD-10-CM

## 2019-03-31 DIAGNOSIS — Z79899 Other long term (current) drug therapy: Secondary | ICD-10-CM | POA: Diagnosis not present

## 2019-03-31 DIAGNOSIS — D508 Other iron deficiency anemias: Secondary | ICD-10-CM

## 2019-03-31 MED ORDER — ERGOCALCIFEROL 1.25 MG (50000 UT) PO CAPS: 50000.0000 [IU] | ORAL_CAPSULE | ORAL | 6 refills | Status: DC

## 2019-03-31 MED ORDER — CYANOCOBALAMIN 1000 MCG/ML IJ SOLN
1000.0000 ug | Freq: Once | INTRAMUSCULAR | Status: AC
  Administered 2019-03-31: 1000 ug via INTRAMUSCULAR

## 2019-03-31 NOTE — Patient Instructions (Signed)

## 2019-05-25 ENCOUNTER — Other Ambulatory Visit: Payer: Self-pay | Admitting: Obstetrics & Gynecology

## 2019-05-25 NOTE — Telephone Encounter (Signed)
Medication refill request: Kaitlib  FE Last AEX:  05/08/18 Next AEX: 08/21/19 Last MMG (if hormonal medication request): 09/20/14 Bi-rads 2 benign Refill authorized: #[id number] with 0RF  Please advise

## 2019-06-02 ENCOUNTER — Encounter: Payer: Self-pay | Admitting: Family

## 2019-06-02 ENCOUNTER — Inpatient Hospital Stay: Payer: 59

## 2019-06-02 ENCOUNTER — Telehealth: Payer: Self-pay | Admitting: Family

## 2019-06-02 ENCOUNTER — Inpatient Hospital Stay: Payer: 59 | Attending: Hematology & Oncology | Admitting: Family

## 2019-06-02 ENCOUNTER — Other Ambulatory Visit: Payer: Self-pay

## 2019-06-02 VITALS — BP 117/68 | HR 64 | Temp 98.0°F | Resp 18 | Wt 269.1 lb

## 2019-06-02 DIAGNOSIS — E559 Vitamin D deficiency, unspecified: Secondary | ICD-10-CM

## 2019-06-02 DIAGNOSIS — D510 Vitamin B12 deficiency anemia due to intrinsic factor deficiency: Secondary | ICD-10-CM

## 2019-06-02 DIAGNOSIS — D500 Iron deficiency anemia secondary to blood loss (chronic): Secondary | ICD-10-CM

## 2019-06-02 DIAGNOSIS — D508 Other iron deficiency anemias: Secondary | ICD-10-CM

## 2019-06-02 DIAGNOSIS — D509 Iron deficiency anemia, unspecified: Secondary | ICD-10-CM | POA: Diagnosis not present

## 2019-06-02 DIAGNOSIS — Z9884 Bariatric surgery status: Secondary | ICD-10-CM | POA: Insufficient documentation

## 2019-06-02 DIAGNOSIS — K909 Intestinal malabsorption, unspecified: Secondary | ICD-10-CM | POA: Insufficient documentation

## 2019-06-02 LAB — CMP (CANCER CENTER ONLY)
ALT: 13 U/L (ref 0–44)
AST: 15 U/L (ref 15–41)
Albumin: 3.6 g/dL (ref 3.5–5.0)
Alkaline Phosphatase: 81 U/L (ref 38–126)
Anion gap: 7 (ref 5–15)
BUN: 11 mg/dL (ref 6–20)
CO2: 23 mmol/L (ref 22–32)
Calcium: 8.2 mg/dL — ABNORMAL LOW (ref 8.9–10.3)
Chloride: 109 mmol/L (ref 98–111)
Creatinine: 0.69 mg/dL (ref 0.44–1.00)
GFR, Est AFR Am: >60 mL/min (ref >60)
GFR, Estimated: >60 mL/min (ref >60)
Glucose, Bld: 84 mg/dL (ref 70–99)
Potassium: 3.9 mmol/L (ref 3.5–5.1)
Sodium: 139 mmol/L (ref 135–145)
Total Bilirubin: 0.4 mg/dL (ref 0.3–1.2)
Total Protein: 6.5 g/dL (ref 6.5–8.1)

## 2019-06-02 LAB — CBC WITH DIFFERENTIAL (CANCER CENTER ONLY)
Abs Immature Granulocytes: 0.01 10*3/uL (ref 0.00–0.07)
Basophils Absolute: 0.1 10*3/uL (ref 0.0–0.1)
Basophils Relative: 1 %
Eosinophils Absolute: 0.1 10*3/uL (ref 0.0–0.5)
Eosinophils Relative: 1 %
HCT: 39.5 % (ref 36.0–46.0)
Hemoglobin: 12.6 g/dL (ref 12.0–15.0)
Immature Granulocytes: 0 %
Lymphocytes Relative: 27 %
Lymphs Abs: 1.8 10*3/uL (ref 0.7–4.0)
MCH: 27.9 pg (ref 26.0–34.0)
MCHC: 31.9 g/dL (ref 30.0–36.0)
MCV: 87.6 fL (ref 80.0–100.0)
Monocytes Absolute: 0.7 10*3/uL (ref 0.1–1.0)
Monocytes Relative: 10 %
Neutro Abs: 4.1 10*3/uL (ref 1.7–7.7)
Neutrophils Relative %: 61 %
Platelet Count: 272 10*3/uL (ref 150–400)
RBC: 4.51 MIL/uL (ref 3.87–5.11)
RDW: 14.3 % (ref 11.5–15.5)
WBC Count: 6.7 10*3/uL (ref 4.0–10.5)
nRBC: 0 % (ref 0.0–0.2)

## 2019-06-02 LAB — IRON AND TIBC
Iron: 111 ug/dL (ref 41–142)
Saturation Ratios: 31 % (ref 21–57)
TIBC: 358 ug/dL (ref 236–444)
UIBC: 246 ug/dL (ref 120–384)

## 2019-06-02 LAB — RETICULOCYTES
Immature Retic Fract: 8.3 % (ref 2.3–15.9)
RBC.: 4.56 MIL/uL (ref 3.87–5.11)
Retic Count, Absolute: 67.9 10*3/uL (ref 19.0–186.0)
Retic Ct Pct: 1.5 % (ref 0.4–3.1)

## 2019-06-02 LAB — FERRITIN: Ferritin: 40 ng/mL (ref 11–307)

## 2019-06-02 LAB — VITAMIN B12: Vitamin B-12: 162 pg/mL — ABNORMAL LOW (ref 180–914)

## 2019-06-02 MED ORDER — CYANOCOBALAMIN 1000 MCG/ML IJ SOLN
1000.0000 ug | Freq: Once | INTRAMUSCULAR | Status: AC
  Administered 2019-06-02: 09:00:00 1000 ug via INTRAMUSCULAR

## 2019-06-02 MED ORDER — CYANOCOBALAMIN 1000 MCG/ML IJ SOLN
INTRAMUSCULAR | Status: AC
  Filled 2019-06-02: qty 1

## 2019-06-02 NOTE — Telephone Encounter (Signed)
Called and LMVM for patient regarding appointments added per 7/21 los

## 2019-06-02 NOTE — Patient Instructions (Signed)
Cyanocobalamin, Pyridoxine, and Folate What is this medicine? A multivitamin containing folic acid, vitamin B6, and vitamin B12. This medicine may be used for other purposes; ask your health care provider or pharmacist if you have questions. COMMON BRAND NAME(S): AllanFol RX, AllanTex, Av-Vite FB, B Complex with Folic Acid, ComBgen, FaBB, Folamin, Folastin, Folbalin, Folbee, Folbic, Folcaps, Folgard, Folgard RX, Folgard RX 2.2, Folplex, Folplex 2.2, Foltabs 800, Foltx, Homocysteine Formula, Niva-Fol, NuFol, TL Gard RX, Virt-Gard, Virt-Vite, Virt-Vite Forte, Vita-Respa What should I tell my health care provider before I take this medicine? They need to know if you have any of these conditions:  bleeding or clotting disorder  history of anemia of any type  other chronic health condition  an unusual or allergic reaction to vitamins, other medicines, foods, dyes, or preservatives  pregnant or trying to get pregnant  breast-feeding How should I use this medicine? Take by mouth with a glass of water. May take with food. Follow the directions on the prescription label. It is usually given once a day. Do not take your medicine more often than directed. Contact your pediatrician regarding the use of this medicine in children. Special care may be needed. Overdosage: If you think you have taken too much of this medicine contact a poison control center or emergency room at once. NOTE: This medicine is only for you. Do not share this medicine with others. What if I miss a dose? If you miss a dose, take it as soon as you can. If it is almost time for your next dose, take only that dose. Do not take double or extra doses. What may interact with this medicine?  levodopa This list may not describe all possible interactions. Give your health care provider a list of all the medicines, herbs, non-prescription drugs, or dietary supplements you use. Also tell them if you smoke, drink alcohol, or use illegal  drugs. Some items may interact with your medicine. What should I watch for while using this medicine? See your health care professional for regular checks on your progress. Remember that vitamin supplements do not replace the need for good nutrition from a balanced diet. What side effects may I notice from receiving this medicine? Side effects that you should report to your doctor or health care professional as soon as possible:  allergic reaction such as skin rash or difficulty breathing  vomiting Side effects that usually do not require medical attention (report to your doctor or health care professional if they continue or are bothersome):  nausea  stomach upset This list may not describe all possible side effects. Call your doctor for medical advice about side effects. You may report side effects to FDA at 1-800-FDA-1088. Where should I keep my medicine? Keep out of the reach of children. Most vitamins should be stored at controlled room temperature. Check your specific product directions. Protect from heat and moisture. Throw away any unused medicine after the expiration date. NOTE: This sheet is a summary. It may not cover all possible information. If you have questions about this medicine, talk to your doctor, pharmacist, or health care provider.  2020 Elsevier/Gold Standard (2007-12-20 00:59:55)  

## 2019-06-02 NOTE — Progress Notes (Signed)
Hematology and Oncology Follow Up Visit  Sheri [name] [id number] July 22, 1976 43 y.o. 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 She is doing well but has noted fatigue and ice cravings lately.  Hgb stable at 12.6 and MCV 87.  She has not noted any blood loss. No bruising or petechiae.  She does not have a cycle on oral contraception.  No fever, chills, n/v, cough, rash, dizziness, SOB, chest pain, palpitations, abdominal pain or changes in bowel or bladder habits.  No swelling, tenderness, numbness or tingling in Sheri [name] extremities at this time. She will sometimes have puffiness in Sheri [name] feet and ankles due to the summer heat.  She has a good appetite and is staying well hydrated. Sheri [name] weight is stable.   ECOG Performance Status: 0 - Asymptomatic  Medications:  Allergies as of 06/02/2019      Reactions   Aspirin Other (See Comments)   Pt. Has had gastric bypass surgery.  ASA causes ulcers.  Can take Aleve. Gastric bypass surgery      Medication List       Accurate as of June 02, 2019  9:01 AM. If you have any questions, ask your nurse or doctor.        clobetasol cream 0.05 % Commonly known as: TEMOVATE daily as needed.   ergocalciferol 1.25 MG (50000 UT) capsule Commonly known as: VITAMIN D2 Take 1 capsule (50,000 Units total) by mouth once a week.   Kaitlib Fe 0.8-25 MG-MCG tablet Generic drug: Norethindrone-Ethinyl Estradiol-Fe CHEW 1 TABLET DAILY   montelukast 10 MG tablet Commonly known as: SINGULAIR Take 1 tablet (10 mg total) by mouth at bedtime.   MULTIVITAMIN PO Take by mouth. Prenatal chewable-[employer] Brand   VITAMIN B-12 IJ Inject every 30 (thirty) days as directed.   Cyanocobalamin 1000 MCG/ML Kit Inject  every 30 (thirty) days as directed.       Allergies:  Allergies  Allergen Reactions  . Aspirin Other (See Comments)    Pt. Has had gastric bypass surgery.  ASA causes ulcers.  Can take Aleve. Gastric bypass surgery    Past Medical History, Surgical history, Social history, and Family History were reviewed and updated.  Review of Systems: All other 10 point review of systems is negative.   Physical Exam:  weight is 269 lb 1.9 oz (122.1 kg). Sheri [name] oral temperature is 98 F (36.7 C). Sheri [name] blood pressure is 117/68 and Sheri [name] pulse is 64. Sheri [name] respiration is 18 and oxygen saturation is 99%.   Wt Readings from Last 3 Encounters:  06/02/19 269 lb 1.9 oz (122.1 kg)  03/31/19 267 lb 12 oz (121.5 kg)  01/27/19 266 lb (120.7 kg)    Ocular: Sclerae unicteric, pupils equal, round and reactive to light Ear-nose-throat: Oropharynx clear, dentition fair Lymphatic: No cervical or supraclavicular adenopathy Lungs no rales or rhonchi, good excursion bilaterally Heart regular rate and rhythm, no murmur appreciated Abd soft, nontender, positive bowel sounds MSK no focal spinal tenderness, no joint edema, no liver or spleen tip palpated on exam, no fluid wave  Neuro: non-focal, well-oriented, appropriate affect Breasts: Deferred   Lab Results  Component Value Date   WBC 6.7 06/02/2019   HGB 12.6 06/02/2019   HCT 39.5 06/02/2019   MCV 87.6  06/02/2019   PLT 272 06/02/2019   Lab Results  Component Value Date   FERRITIN 38 01/27/2019   IRON 102 01/27/2019   TIBC 326 01/27/2019   UIBC 224 01/27/2019   IRONPCTSAT 31 01/27/2019   Lab Results  Component Value Date   RETICCTPCT 1.5 06/02/2019   RBC 4.56 06/02/2019   No results found for: KPAFRELGTCHN, LAMBDASER, KAPLAMBRATIO No results found for: IGGSERUM, IGA, IGMSERUM No results found for: Odetta Pink, SPEI   Chemistry      Component Value Date/Time   NA 139 01/27/2019 0742   NA 144  10/08/2017 0812   NA 140 07/10/2017 0838   K 4.0 01/27/2019 0742   K 3.9 10/08/2017 0812   K 3.6 07/10/2017 0838   CL 108 01/27/2019 0742   CL 109 (H) 10/08/2017 0812   CO2 24 01/27/2019 0742   CO2 25 10/08/2017 0812   CO2 23 07/10/2017 0838   BUN 11 01/27/2019 0742   BUN 9 10/08/2017 0812   BUN 9.0 07/10/2017 0838   CREATININE 0.79 01/27/2019 0742   CREATININE 0.8 10/08/2017 0812   CREATININE 0.8 07/10/2017 0838      Component Value Date/Time   CALCIUM 8.8 (L) 01/27/2019 0742   CALCIUM 9.1 10/08/2017 0812   CALCIUM 9.2 07/10/2017 0838   ALKPHOS 79 01/27/2019 0742   ALKPHOS 78 10/08/2017 0812   ALKPHOS 127 07/10/2017 0838   AST 17 01/27/2019 0742   AST 22 07/10/2017 0838   ALT 16 01/27/2019 0742   ALT 23 10/08/2017 0812   ALT 22 07/10/2017 0838   BILITOT 0.4 01/27/2019 0742   BILITOT 0.34 07/10/2017 0838       Impression and Plan: Sheri [name] is a very pleasant 43 yo African American female with iron, B-12 and Vit D deficiency 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  She received Sheri [name] B 12 today.  We will see what Sheri [name] iron studies show and bring Sheri [name] back in for infusion if needed.  We will plan to see Sheri [name] back in another 4 months. She will will come back in 2 months for B 12 injection. She will contact our office with any questions or concerns. We can certainly see Sheri [name] sooner if needed.   Laverna Peace, NP 7/21/20209:01 AM

## 2019-06-05 ENCOUNTER — Telehealth: Payer: Self-pay | Admitting: Hematology & Oncology

## 2019-06-05 ENCOUNTER — Telehealth: Payer: Self-pay | Admitting: *Deleted

## 2019-06-05 NOTE — Telephone Encounter (Signed)
Call received from patient wanting to know lab results from yesterday.   Lab results from 06/04/19 reviewed with patient.  Pt has no further questions or concerns at this time. Appts for Vit B-12 injections made per scheduling.

## 2019-06-05 NOTE — Telephone Encounter (Signed)
lmom to inform patient of biweekly B12 injections 8/4 at 0915 per 7/23 result note

## 2019-06-10 ENCOUNTER — Other Ambulatory Visit: Payer: Self-pay | Admitting: Obstetrics & Gynecology

## 2019-06-16 ENCOUNTER — Other Ambulatory Visit: Payer: Self-pay

## 2019-06-16 ENCOUNTER — Inpatient Hospital Stay: Payer: 59 | Attending: Hematology & Oncology

## 2019-06-16 VITALS — BP 121/72 | HR 57 | Temp 97.6°F | Resp 18

## 2019-06-16 DIAGNOSIS — E538 Deficiency of other specified B group vitamins: Secondary | ICD-10-CM | POA: Insufficient documentation

## 2019-06-16 DIAGNOSIS — K912 Postsurgical malabsorption, not elsewhere classified: Secondary | ICD-10-CM | POA: Insufficient documentation

## 2019-06-16 DIAGNOSIS — Z9884 Bariatric surgery status: Secondary | ICD-10-CM | POA: Diagnosis not present

## 2019-06-16 DIAGNOSIS — D510 Vitamin B12 deficiency anemia due to intrinsic factor deficiency: Secondary | ICD-10-CM

## 2019-06-16 MED ORDER — CYANOCOBALAMIN 1000 MCG/ML IJ SOLN
INTRAMUSCULAR | Status: AC
  Filled 2019-06-16: qty 1

## 2019-06-16 MED ORDER — CYANOCOBALAMIN 1000 MCG/ML IJ SOLN
1000.0000 ug | Freq: Once | INTRAMUSCULAR | Status: AC
  Administered 2019-06-16: 1000 ug via INTRAMUSCULAR

## 2019-06-16 NOTE — Patient Instructions (Signed)

## 2019-06-30 ENCOUNTER — Inpatient Hospital Stay: Payer: 59

## 2019-06-30 ENCOUNTER — Other Ambulatory Visit: Payer: Self-pay

## 2019-06-30 VITALS — BP 122/75 | HR 84 | Temp 97.5°F | Resp 17

## 2019-06-30 DIAGNOSIS — D510 Vitamin B12 deficiency anemia due to intrinsic factor deficiency: Secondary | ICD-10-CM

## 2019-06-30 DIAGNOSIS — E538 Deficiency of other specified B group vitamins: Secondary | ICD-10-CM | POA: Diagnosis not present

## 2019-06-30 MED ORDER — CYANOCOBALAMIN 1000 MCG/ML IJ SOLN
1000.0000 ug | Freq: Once | INTRAMUSCULAR | Status: AC
  Administered 2019-06-30: 1000 ug via INTRAMUSCULAR

## 2019-06-30 MED ORDER — CYANOCOBALAMIN 1000 MCG/ML IJ SOLN
INTRAMUSCULAR | Status: AC
  Filled 2019-06-30: qty 1

## 2019-06-30 NOTE — Patient Instructions (Signed)

## 2019-07-14 ENCOUNTER — Inpatient Hospital Stay: Payer: 59 | Attending: Hematology & Oncology

## 2019-07-14 ENCOUNTER — Other Ambulatory Visit: Payer: Self-pay

## 2019-07-14 ENCOUNTER — Other Ambulatory Visit: Payer: Self-pay | Admitting: *Deleted

## 2019-07-14 VITALS — BP 107/67 | HR 60 | Temp 97.7°F | Resp 18

## 2019-07-14 DIAGNOSIS — D510 Vitamin B12 deficiency anemia due to intrinsic factor deficiency: Secondary | ICD-10-CM | POA: Diagnosis present

## 2019-07-14 MED ORDER — CYANOCOBALAMIN 1000 MCG/ML IJ SOLN: 1000.0000 ug | INTRAMUSCULAR | 2 refills | Status: DC

## 2019-07-14 MED ORDER — CYANOCOBALAMIN 1000 MCG/ML IJ SOLN
1000.0000 ug | Freq: Once | INTRAMUSCULAR | Status: AC
  Administered 2019-07-14: 1000 ug via INTRAMUSCULAR

## 2019-07-14 MED ORDER — CYANOCOBALAMIN 1000 MCG/ML IJ SOLN
INTRAMUSCULAR | Status: AC
  Filled 2019-07-14: qty 1

## 2019-07-14 NOTE — Patient Instructions (Signed)

## 2019-07-14 NOTE — Progress Notes (Signed)
8:50 AM Pt requests to give herself Vitamin B12 injections at home, states she has done this in the past. Message left for Dr. Marin Olp, we will call her and call in to [facility] if Dr. Marin Olp approves. Verbalized understanding.

## 2019-07-27 ENCOUNTER — Ambulatory Visit: Payer: 59

## 2019-07-28 ENCOUNTER — Ambulatory Visit: Payer: 59

## 2019-08-03 ENCOUNTER — Ambulatory Visit: Payer: 59

## 2019-08-11 ENCOUNTER — Ambulatory Visit: Payer: 59

## 2019-08-20 ENCOUNTER — Other Ambulatory Visit: Payer: Self-pay

## 2019-08-21 ENCOUNTER — Other Ambulatory Visit: Payer: Self-pay | Admitting: Obstetrics & Gynecology

## 2019-08-21 ENCOUNTER — Encounter: Payer: Self-pay | Admitting: Obstetrics & Gynecology

## 2019-08-21 ENCOUNTER — Encounter

## 2019-08-21 ENCOUNTER — Ambulatory Visit (INDEPENDENT_AMBULATORY_CARE_PROVIDER_SITE_OTHER): Payer: 59 | Admitting: Obstetrics & Gynecology

## 2019-08-21 VITALS — BP 126/80 | HR 80 | Temp 96.9°F | Ht 65.5 in | Wt 273.0 lb

## 2019-08-21 DIAGNOSIS — Z01419 Encounter for gynecological examination (general) (routine) without abnormal findings: Secondary | ICD-10-CM | POA: Diagnosis not present

## 2019-08-21 DIAGNOSIS — Z1231 Encounter for screening mammogram for malignant neoplasm of breast: Secondary | ICD-10-CM

## 2019-08-21 DIAGNOSIS — Z1211 Encounter for screening for malignant neoplasm of colon: Secondary | ICD-10-CM | POA: Diagnosis not present

## 2019-08-21 DIAGNOSIS — Z23 Encounter for immunization: Secondary | ICD-10-CM

## 2019-08-21 MED ORDER — NORETHIN-ETH ESTRADIOL-FE 0.8-25 MG-MCG PO CHEW: 1.0000 | CHEWABLE_TABLET | Freq: Every day | ORAL | 4 refills | Status: DC

## 2019-08-21 NOTE — Progress Notes (Signed)
43 y.o. G2P0 Single Black or [place] American female here for annual exam.  H/o pernicious anemia.  Has been receiving B 12 injections every 2 weeks.  Now giving injections at home.  Having trouble with drawing up the B12 in the syringe.    She still receives intermittent iron infusions.  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  She is working from home as she has for many years.  She has 15 direct reports.    Denies vaginal bleeding.  Takes OCPs continuous active.    Has considered doing gastric surgery revision.    Sister has been diagnosed with breast cancer.     Patient's last menstrual period was 11/12/2010 (approximate).          Sexually active: No.  The current method of family planning is OCP (estrogen/progesterone).    Exercising: Yes.    walking, weights Smoker:  no  Health Maintenance: Pap:  06/04/16 neg. HR HPV:neg   04/02/14 Neg  History of abnormal Pap:  no MMG:  09/20/14 [place] Right BIRADS2:benign. F/u 1 year  Colonoscopy:  Never BMD:   Never TDaP:  2012 Screening Labs: PCP   reports that she has never smoked. She has never used smokeless tobacco. She reports that she does not drink alcohol or use drugs.  Past Medical History:  Diagnosis Date  . Anemia    and low B12  . Heart murmur    as child - no problems as adult  . History of blood transfusion 2004   [facility], after surgery  . Hypothyroidism    history in past, resolved  . Inflammation of joint of knee    right knee-on anti-inflammatory, arthritis  . Seasonal allergies     Past Surgical History:  Procedure Laterality Date  . ABDOMINOPLASTY  2004  . BREAST REDUCTION SURGERY  2005  . GASTRIC BYPASS  2001   Dr Duwayne Heck, in [place]  . LAPAROSCOPIC OVARIAN CYSTECTOMY Right 05/17/2014   Procedure: LAPAROSCOPIC  partial  right salpingo-oohorectomy  and cystoscopy;  Surgeon: Lyman Speller, MD;  Location: [facility];  Service: Gynecology;  Laterality: Right;  . WISDOM TOOTH EXTRACTION   2014   upper right    Current Outpatient Medications  Medication Sig Dispense Refill  . cyanocobalamin (,VITAMIN B-12,) 1000 MCG/ML injection Inject 1 mL (1,000 mcg total) into the muscle every 30 (thirty) days. Dispense appropriate syringes for injection 1 mL 2  . ergocalciferol (VITAMIN D2) 1.25 MG (50000 UT) capsule Take 1 capsule (50,000 Units total) by mouth once a week. 8 capsule 6  . KAITLIB FE 0.8-25 MG-MCG tablet CHEW 1 TABLET DAILY 84 tablet 1  . montelukast (SINGULAIR) 10 MG tablet Take 1 tablet (10 mg total) by mouth at bedtime. 90 tablet 4   No current facility-administered medications for this visit.     Family History  Problem Relation Age of Onset  . CVA Maternal Grandmother   . Hypertension Brother   . Diabetes Brother   . Heart disease Brother   . Diabetes Sister   . Hypertension Sister   . Diabetes Sister   . Hypertension Sister   . Diabetes Brother   . Hypertension Brother   . Heart disease Brother   . Diabetes Brother   . Hypertension Brother   . Heart disease Brother     Review of Systems  All other systems reviewed and are negative.   Exam:   BP 126/80   Pulse 80   Temp Marland Kitchen)  96.9 F (36.1 C) (Temporal)   Ht 5' 5.5" (1.664 m)   Wt 273 lb (123.8 kg)   LMP 11/12/2010 (Approximate)   BMI 44.74 kg/m    Height: 5' 5.5" (166.4 cm)  Ht Readings from Last 3 Encounters:  08/21/19 5' 5.5" (1.664 m)  05/08/18 5\' 6"  (1.676 m)  09/19/17 5' 5.5" (1.664 m)    General appearance: alert, cooperative and appears stated age Head: Normocephalic, without obvious abnormality, atraumatic Neck: no adenopathy, supple, symmetrical, trachea midline and thyroid normal to inspection and palpation Lungs: clear to auscultation bilaterally Breasts: normal appearance, no masses or tenderness Heart: regular rate and rhythm Abdomen: soft, non-tender; bowel sounds normal; no masses,  no organomegaly Extremities: extremities normal, atraumatic, no cyanosis or edema Skin:  Skin color, texture, turgor normal. No rashes or lesions Lymph nodes: Cervical, supraclavicular, and axillary nodes normal. No abnormal inguinal nodes palpated Neurologic: Grossly normal   Pelvic: External genitalia:  no lesions              Urethra:  normal appearing urethra with no masses, tenderness or lesions              Bartholins and Skenes: normal                 Vagina: normal appearing vagina with normal color and discharge, no lesions              Cervix: no lesions              Pap taken: No. Bimanual Exam:  Uterus:  normal size, contour, position, consistency, mobility, non-tender              Adnexa: normal adnexa and no mass, fullness, tenderness               Rectovaginal: Confirms               Anus:  normal sphincter tone, no lesions  Chaperone was present for exam.  A:  Well Woman with normal exam Family hx of breast cancer in sister, age 81 H/o iron deficiency and pernicious anemia, followed by Dr. Marin Olp Obesity Vit D deficiency  P:   Mammogram guidelines reviewed.  Screening breast MRI reviewed as well.  She will have her MMG now and then consider breast MRI. pap smear with neg HR HPV obtained 2017.  New guidelines reviewed.  Will repeat in 2022 RF for OCPs, continuous active.  #4 month supply/4RF. Plan fasting lipid in next 1-2 years IFOB given Flu shot given today Return annually or prn

## 2019-09-01 ENCOUNTER — Ambulatory Visit: Payer: 59

## 2019-09-16 LAB — FECAL OCCULT BLOOD, IMMUNOCHEMICAL: Fecal Occult Bld: NEGATIVE

## 2019-10-02 ENCOUNTER — Telehealth: Payer: Self-pay | Admitting: Family

## 2019-10-02 ENCOUNTER — Ambulatory Visit: Payer: 59

## 2019-10-02 ENCOUNTER — Inpatient Hospital Stay (HOSPITAL_BASED_OUTPATIENT_CLINIC_OR_DEPARTMENT_OTHER): Payer: 59 | Admitting: Family

## 2019-10-02 ENCOUNTER — Other Ambulatory Visit: Payer: Self-pay

## 2019-10-02 ENCOUNTER — Inpatient Hospital Stay: Payer: 59 | Attending: Hematology & Oncology

## 2019-10-02 VITALS — BP 123/67 | HR 64 | Temp 97.7°F | Resp 18 | Wt 276.8 lb

## 2019-10-02 DIAGNOSIS — D510 Vitamin B12 deficiency anemia due to intrinsic factor deficiency: Secondary | ICD-10-CM

## 2019-10-02 DIAGNOSIS — D508 Other iron deficiency anemias: Secondary | ICD-10-CM | POA: Diagnosis not present

## 2019-10-02 DIAGNOSIS — D500 Iron deficiency anemia secondary to blood loss (chronic): Secondary | ICD-10-CM

## 2019-10-02 DIAGNOSIS — D509 Iron deficiency anemia, unspecified: Secondary | ICD-10-CM | POA: Insufficient documentation

## 2019-10-02 DIAGNOSIS — K909 Intestinal malabsorption, unspecified: Secondary | ICD-10-CM | POA: Diagnosis not present

## 2019-10-02 DIAGNOSIS — Z9884 Bariatric surgery status: Secondary | ICD-10-CM | POA: Insufficient documentation

## 2019-10-02 LAB — IRON AND TIBC
Iron: 148 ug/dL — ABNORMAL HIGH (ref 41–142)
Saturation Ratios: 43 % (ref 21–57)
TIBC: 343 ug/dL (ref 236–444)
UIBC: 195 ug/dL (ref 120–384)

## 2019-10-02 LAB — CBC WITH DIFFERENTIAL (CANCER CENTER ONLY)
Abs Immature Granulocytes: 0.01 10*3/uL (ref 0.00–0.07)
Basophils Absolute: 0 10*3/uL (ref 0.0–0.1)
Basophils Relative: 1 %
Eosinophils Absolute: 0.1 10*3/uL (ref 0.0–0.5)
Eosinophils Relative: 1 %
HCT: 39.9 % (ref 36.0–46.0)
Hemoglobin: 12.8 g/dL (ref 12.0–15.0)
Immature Granulocytes: 0 %
Lymphocytes Relative: 28 %
Lymphs Abs: 1.9 10*3/uL (ref 0.7–4.0)
MCH: 27.6 pg (ref 26.0–34.0)
MCHC: 32.1 g/dL (ref 30.0–36.0)
MCV: 86.2 fL (ref 80.0–100.0)
Monocytes Absolute: 0.7 10*3/uL (ref 0.1–1.0)
Monocytes Relative: 10 %
Neutro Abs: 4 10*3/uL (ref 1.7–7.7)
Neutrophils Relative %: 60 %
Platelet Count: 270 10*3/uL (ref 150–400)
RBC: 4.63 MIL/uL (ref 3.87–5.11)
RDW: 14 % (ref 11.5–15.5)
WBC Count: 6.7 10*3/uL (ref 4.0–10.5)
nRBC: 0 % (ref 0.0–0.2)

## 2019-10-02 LAB — RETICULOCYTES
Immature Retic Fract: 4.5 % (ref 2.3–15.9)
RBC.: 4.63 MIL/uL (ref 3.87–5.11)
Retic Count, Absolute: 57.4 10*3/uL (ref 19.0–186.0)
Retic Ct Pct: 1.2 % (ref 0.4–3.1)

## 2019-10-02 LAB — CMP (CANCER CENTER ONLY)
ALT: 16 U/L (ref 0–44)
AST: 15 U/L (ref 15–41)
Albumin: 3.9 g/dL (ref 3.5–5.0)
Alkaline Phosphatase: 81 U/L (ref 38–126)
Anion gap: 7 (ref 5–15)
BUN: 13 mg/dL (ref 6–20)
CO2: 23 mmol/L (ref 22–32)
Calcium: 9.2 mg/dL (ref 8.9–10.3)
Chloride: 109 mmol/L (ref 98–111)
Creatinine: 0.78 mg/dL (ref 0.44–1.00)
GFR, Est AFR Am: >60 mL/min (ref >60)
GFR, Estimated: >60 mL/min (ref >60)
Glucose, Bld: 95 mg/dL (ref 70–99)
Potassium: 3.9 mmol/L (ref 3.5–5.1)
Sodium: 139 mmol/L (ref 135–145)
Total Bilirubin: 0.4 mg/dL (ref 0.3–1.2)
Total Protein: 7.3 g/dL (ref 6.5–8.1)

## 2019-10-02 LAB — VITAMIN B12: Vitamin B-12: 366 pg/mL (ref 180–914)

## 2019-10-02 LAB — FERRITIN: Ferritin: 41 ng/mL (ref 11–307)

## 2019-10-02 NOTE — Telephone Encounter (Signed)
Tried calling patient unable to [facility] letter/calendar mailed per 11/20 los

## 2019-10-02 NOTE — Progress Notes (Signed)
Hematology and Oncology Follow Up Visit  Sheri [name] [id number] February 05, 1976 43 y.o. 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   Interim History:  Sheri [name] is here today for follow-up. She is doing well and feeling "pretty good right now". Hgb is 12.8, MCV 86.2, platelets 270. She has not noted any blood loss. No bruising or petechiae.  She is still on her chewable birth control and does not have a cycle.  No ice cravings.  She has occasional fatigue.  No fever, chills, n/v, cough, rash, dizziness, SOB, chest pain, palpitations, abdominal pain or changes in bowel or bladder habits.  No tenderness, numbness or tingling in her extremities at this time.  She has occasional numbness and tingling in her hands and feet.  She has swelling off and on in her feet, ankles and left knee. She plans to find an orthopedist to follow-up with.  She states that she has maintained a good appetite and is staying well hydrated. Her weight is stable.   ECOG Performance Status: 1 - Symptomatic but completely ambulatory  Medications:  Allergies as of 10/02/2019      Reactions   Aspirin Other (See Comments)   Pt. Has had gastric bypass surgery.  ASA causes ulcers.  Can take Aleve. Gastric bypass surgery      Medication List       Accurate as of October 02, 2019 11:07 AM. If you have any questions, ask your nurse or doctor.        cyanocobalamin 1000 MCG/ML injection Commonly known as: (VITAMIN B-12) Inject 1 mL (1,000 mcg total) into the muscle every 30 (thirty) days. Dispense appropriate syringes for injection   ergocalciferol 1.25 MG (50000 UT) capsule Commonly known as: VITAMIN D2 Take 1 capsule (50,000 Units total) by mouth once a week.   montelukast 10 MG tablet Commonly known as:  SINGULAIR Take 1 tablet (10 mg total) by mouth at bedtime.   Norethindrone-Ethinyl Estradiol-Fe 0.8-25 MG-MCG tablet Commonly known as: Kaitlib Fe Chew 1 tablet by mouth daily. Takes continuous active OCPs.       Allergies:  Allergies  Allergen Reactions  . Aspirin Other (See Comments)    Pt. Has had gastric bypass surgery.  ASA causes ulcers.  Can take Aleve. Gastric bypass surgery    Past Medical History, Surgical history, Social history, and Family History were reviewed and updated.  Review of Systems: All other 10 point review of systems is negative.   Physical Exam:  weight is 276 lb 12 oz (125.5 kg). Her tympanic temperature is 97.7 F (36.5 C). Her blood pressure is 123/67 and her pulse is 64. Her respiration is 18 and oxygen saturation is 100%.   Wt Readings from Last 3 Encounters:  10/02/19 276 lb 12 oz (125.5 kg)  08/21/19 273 lb (123.8 kg)  06/02/19 269 lb 1.9 oz (122.1 kg)    Ocular: Sclerae unicteric, pupils equal, round and reactive to light Ear-nose-throat: Oropharynx clear, dentition fair Lymphatic: No cervical or supraclavicular adenopathy Lungs no rales or rhonchi, good excursion bilaterally Heart regular rate and rhythm, no murmur appreciated Abd soft, nontender, positive bowel sounds, no liver or spleen tip palpated on exam, no fluid wave  MSK no focal spinal tenderness, no joint edema Neuro: non-focal, well-oriented, appropriate affect Breasts: Deferred   Lab Results  Component Value Date   WBC 6.7 10/02/2019   HGB 12.8 10/02/2019   HCT 39.9 10/02/2019   MCV 86.2 10/02/2019   PLT 270 10/02/2019   Lab Results  Component Value Date   FERRITIN 40 06/02/2019   IRON 111 06/02/2019   TIBC 358 06/02/2019   UIBC 246 06/02/2019   IRONPCTSAT 31 06/02/2019   Lab Results  Component Value Date   RETICCTPCT 1.2 10/02/2019   RBC 4.63 10/02/2019   RBC 4.63 10/02/2019   No results found for: KPAFRELGTCHN, LAMBDASER, KAPLAMBRATIO No results found  for: IGGSERUM, IGA, IGMSERUM No results found for: Odetta Pink, SPEI   Chemistry      Component Value Date/Time   NA 139 10/02/2019 0854   NA 144 10/08/2017 0812   NA 140 07/10/2017 0838   K 3.9 10/02/2019 0854   K 3.9 10/08/2017 0812   K 3.6 07/10/2017 0838   CL 109 10/02/2019 0854   CL 109 (H) 10/08/2017 0812   CO2 23 10/02/2019 0854   CO2 25 10/08/2017 0812   CO2 23 07/10/2017 0838   BUN 13 10/02/2019 0854   BUN 9 10/08/2017 0812   BUN 9.0 07/10/2017 0838   CREATININE 0.78 10/02/2019 0854   CREATININE 0.8 10/08/2017 0812   CREATININE 0.8 07/10/2017 0838      Component Value Date/Time   CALCIUM 9.2 10/02/2019 0854   CALCIUM 9.1 10/08/2017 0812   CALCIUM 9.2 07/10/2017 0838   ALKPHOS 81 10/02/2019 0854   ALKPHOS 78 10/08/2017 0812   ALKPHOS 127 07/10/2017 0838   AST 15 10/02/2019 0854   AST 22 07/10/2017 0838   ALT 16 10/02/2019 0854   ALT 23 10/08/2017 0812   ALT 22 07/10/2017 0838   BILITOT 0.4 10/02/2019 0854   BILITOT 0.34 07/10/2017 0838       Impression and Plan: Sheri [name] is a very pleasant 43 yo African American female with iron, B-12 and Vit D deficiency 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  We will see what her iron studies show and bring her back in for infusion if needed.  We will plan to see her back in another 4 months with B 12 injections once every month.  She will contact our office with any questions or concerns. We can certainly see her sooner if needed.   Laverna Peace, NP 11/20/202011:07 AM

## 2019-10-06 ENCOUNTER — Ambulatory Visit
Admission: RE | Admit: 2019-10-06 | Discharge: 2019-10-06 | Disposition: A | Discharge status: Home / Self Care | Payer: 59 | Source: Ambulatory Visit | Attending: Obstetrics & Gynecology | Admitting: Obstetrics & Gynecology

## 2019-10-06 ENCOUNTER — Other Ambulatory Visit: Payer: Self-pay

## 2019-10-06 DIAGNOSIS — Z1231 Encounter for screening mammogram for malignant neoplasm of breast: Secondary | ICD-10-CM

## 2019-10-31 ENCOUNTER — Other Ambulatory Visit: Payer: Self-pay | Admitting: Hematology & Oncology

## 2019-11-06 ENCOUNTER — Other Ambulatory Visit: Payer: Self-pay | Admitting: Obstetrics & Gynecology

## 2019-11-06 DIAGNOSIS — Z01419 Encounter for gynecological examination (general) (routine) without abnormal findings: Secondary | ICD-10-CM

## 2019-11-09 NOTE — Telephone Encounter (Signed)
Med refill request: Kaitlib  Last AEX: 08/21/19 Next AEX: 11/25/2020 Last MMG (if hormonal med) 10/07/19, BIRADS 1, negative Refill authorized: Please Advise? #[phone], 4 RF to get to next AEX. Orders pended if approved.

## 2019-12-21 ENCOUNTER — Telehealth: Payer: Self-pay | Admitting: Family

## 2019-12-21 NOTE — Telephone Encounter (Signed)
Appointments were rescheduled due to provider [facility] letter/calendar mailed

## 2020-01-22 ENCOUNTER — Other Ambulatory Visit: Payer: Self-pay | Admitting: *Deleted

## 2020-01-22 ENCOUNTER — Inpatient Hospital Stay: Payer: 59 | Attending: Family

## 2020-01-22 ENCOUNTER — Telehealth: Payer: Self-pay | Admitting: *Deleted

## 2020-01-22 ENCOUNTER — Inpatient Hospital Stay (HOSPITAL_BASED_OUTPATIENT_CLINIC_OR_DEPARTMENT_OTHER): Payer: 59 | Admitting: Family

## 2020-01-22 ENCOUNTER — Other Ambulatory Visit: Payer: Self-pay | Admitting: Family

## 2020-01-22 ENCOUNTER — Encounter: Payer: Self-pay | Admitting: Family

## 2020-01-22 ENCOUNTER — Other Ambulatory Visit: Payer: Self-pay

## 2020-01-22 VITALS — BP 128/60 | HR 67 | Temp 97.3°F | Resp 18 | Ht 65.0 in | Wt 276.8 lb

## 2020-01-22 DIAGNOSIS — D509 Iron deficiency anemia, unspecified: Secondary | ICD-10-CM | POA: Insufficient documentation

## 2020-01-22 DIAGNOSIS — Z9884 Bariatric surgery status: Secondary | ICD-10-CM | POA: Diagnosis not present

## 2020-01-22 DIAGNOSIS — D508 Other iron deficiency anemias: Secondary | ICD-10-CM

## 2020-01-22 DIAGNOSIS — R5383 Other fatigue: Secondary | ICD-10-CM | POA: Insufficient documentation

## 2020-01-22 DIAGNOSIS — D510 Vitamin B12 deficiency anemia due to intrinsic factor deficiency: Secondary | ICD-10-CM | POA: Insufficient documentation

## 2020-01-22 DIAGNOSIS — M7989 Other specified soft tissue disorders: Secondary | ICD-10-CM | POA: Insufficient documentation

## 2020-01-22 DIAGNOSIS — K909 Intestinal malabsorption, unspecified: Secondary | ICD-10-CM | POA: Diagnosis not present

## 2020-01-22 DIAGNOSIS — Z886 Allergy status to analgesic agent status: Secondary | ICD-10-CM | POA: Diagnosis not present

## 2020-01-22 DIAGNOSIS — E559 Vitamin D deficiency, unspecified: Secondary | ICD-10-CM | POA: Diagnosis not present

## 2020-01-22 LAB — CBC WITH DIFFERENTIAL (CANCER CENTER ONLY)
Abs Immature Granulocytes: 0.03 10*3/uL (ref 0.00–0.07)
Basophils Absolute: 0.1 10*3/uL (ref 0.0–0.1)
Basophils Relative: 1 %
Eosinophils Absolute: 0.4 10*3/uL (ref 0.0–0.5)
Eosinophils Relative: 7 %
HCT: 40.6 % (ref 36.0–46.0)
Hemoglobin: 12.9 g/dL (ref 12.0–15.0)
Immature Granulocytes: 1 %
Lymphocytes Relative: 30 %
Lymphs Abs: 1.9 10*3/uL (ref 0.7–4.0)
MCH: 27.5 pg (ref 26.0–34.0)
MCHC: 31.8 g/dL (ref 30.0–36.0)
MCV: 86.6 fL (ref 80.0–100.0)
Monocytes Absolute: 0.7 10*3/uL (ref 0.1–1.0)
Monocytes Relative: 11 %
Neutro Abs: 3.3 10*3/uL (ref 1.7–7.7)
Neutrophils Relative %: 50 %
Platelet Count: 268 10*3/uL (ref 150–400)
RBC: 4.69 MIL/uL (ref 3.87–5.11)
RDW: 14.1 % (ref 11.5–15.5)
WBC Count: 6.5 10*3/uL (ref 4.0–10.5)
nRBC: 0 % (ref 0.0–0.2)

## 2020-01-22 LAB — CMP (CANCER CENTER ONLY)
ALT: 17 U/L (ref 0–44)
AST: 17 U/L (ref 15–41)
Albumin: 3.8 g/dL (ref 3.5–5.0)
Alkaline Phosphatase: 76 U/L (ref 38–126)
Anion gap: 7 (ref 5–15)
BUN: 12 mg/dL (ref 6–20)
CO2: 25 mmol/L (ref 22–32)
Calcium: 9 mg/dL (ref 8.9–10.3)
Chloride: 108 mmol/L (ref 98–111)
Creatinine: 0.91 mg/dL (ref 0.44–1.00)
GFR, Est AFR Am: >60 mL/min (ref >60)
GFR, Estimated: >60 mL/min (ref >60)
Glucose, Bld: 93 mg/dL (ref 70–99)
Potassium: 4.1 mmol/L (ref 3.5–5.1)
Sodium: 140 mmol/L (ref 135–145)
Total Bilirubin: 0.4 mg/dL (ref 0.3–1.2)
Total Protein: 6.9 g/dL (ref 6.5–8.1)

## 2020-01-22 LAB — RETICULOCYTES
Immature Retic Fract: 5.8 % (ref 2.3–15.9)
RBC.: 4.67 MIL/uL (ref 3.87–5.11)
Retic Count, Absolute: 49.5 10*3/uL (ref 19.0–186.0)
Retic Ct Pct: 1.1 % (ref 0.4–3.1)

## 2020-01-22 LAB — IRON AND TIBC
Iron: 124 ug/dL (ref 41–142)
Saturation Ratios: 36 % (ref 21–57)
TIBC: 348 ug/dL (ref 236–444)
UIBC: 223 ug/dL (ref 120–384)

## 2020-01-22 LAB — VITAMIN B12: Vitamin B-12: 1306 pg/mL — ABNORMAL HIGH (ref 180–914)

## 2020-01-22 LAB — VITAMIN D 25 HYDROXY (VIT D DEFICIENCY, FRACTURES): Vit D, 25-Hydroxy: 45.57 ng/mL (ref 30–100)

## 2020-01-22 LAB — FERRITIN: Ferritin: 35 ng/mL (ref 11–307)

## 2020-01-22 MED ORDER — CYANOCOBALAMIN 1000 MCG/ML IJ SOLN: INTRAMUSCULAR | 4 refills | Status: DC

## 2020-01-22 NOTE — Telephone Encounter (Signed)
Called pt, lmovm for pt to give Vitamin B12 injections every other month.

## 2020-01-22 NOTE — Progress Notes (Signed)
Hematology and Oncology Follow Up Visit  Sheri [name] [id number] [birth year] 44 y.o. 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 She is feeling fatigued and wanting to rest more often.  She had a little blood on her toilet tissue and is unsure if this was from the rectum or vagina. She only had the one episode. No other bleeding noted.  No bruising or petechiae.  She is administering her B 12 once a month.  No fever, chills, n/v, cough, rash, dizziness, SOB, chest pain, palpitations, abdominal pain or changes in bowel or bladder habits.  She has mild swelling in the feet and ankles that comes and goes. This occurs when sitting for an extended period of time.  No tenderness, numbness or tingling in her extremities.  No falls or syncope.  She has maintained a good appetite and is staying well hydrated. Her weight is stable.   ECOG Performance Status: 1 - Symptomatic but completely ambulatory  Medications:  Allergies as of 01/22/2020      Reactions   Aspirin Other (See Comments)   Pt. Has had gastric bypass surgery.  ASA causes ulcers.  Can take Aleve. Gastric bypass surgery      Medication List       Accurate as of January 22, 2020  9:14 AM. If you have any questions, ask your nurse or doctor.        cyanocobalamin 1000 MCG/ML injection Commonly known as: (VITAMIN B-12) INJECT 1 ML (1,000 MCG TOTAL) INTO THE MUSCLE EVERY 30 DAYS.   ergocalciferol 1.25 MG (50000 UT) capsule Commonly known as: VITAMIN D2 Take 1 capsule (50,000 Units total) by mouth once a week.   Kaitlib Fe 0.8-25 MG-MCG tablet Generic drug: Norethindrone-Ethinyl Estradiol-Fe CHEW 1 TABLET DAILY   montelukast 10 MG tablet Commonly known as: SINGULAIR Take  1 tablet (10 mg total) by mouth at bedtime.       Allergies:  Allergies  Allergen Reactions  . Aspirin Other (See Comments)    Pt. Has had gastric bypass surgery.  ASA causes ulcers.  Can take Aleve. Gastric bypass surgery    Past Medical History, Surgical history, Social history, and Family History were reviewed and updated.  Review of Systems: All other 10 point review of systems is negative.   Physical Exam:  vitals were not taken for this visit.   Wt Readings from Last 3 Encounters:  10/02/19 276 lb 12 oz (125.5 kg)  08/21/19 273 lb (123.8 kg)  06/02/19 269 lb 1.9 oz (122.1 kg)    Ocular: Sclerae unicteric, pupils equal, round and reactive to light Ear-nose-throat: Oropharynx clear, dentition fair Lymphatic: No cervical or supraclavicular adenopathy Lungs no rales or rhonchi, good excursion bilaterally Heart regular rate and rhythm, no murmur appreciated Abd soft, nontender, positive bowel sounds, no liver or spleen tip palpated on exam, no fluid wave  MSK no focal spinal tenderness, no joint edema Neuro: non-focal, well-oriented, appropriate affect Breasts: Deferred   Lab Results  Component Value Date   WBC 6.5 01/22/2020   HGB 12.9 01/22/2020   HCT 40.6 01/22/2020   MCV 86.6 01/22/2020   PLT 268 01/22/2020   Lab Results  Component Value Date   FERRITIN 41 10/02/2019   IRON 148 (H) 10/02/2019  TIBC 343 10/02/2019   UIBC 195 10/02/2019   IRONPCTSAT 43 10/02/2019   Lab Results  Component Value Date   RETICCTPCT 1.1 01/22/2020   RBC 4.67 01/22/2020   No results found for: KPAFRELGTCHN, LAMBDASER, KAPLAMBRATIO No results found for: IGGSERUM, IGA, IGMSERUM No results found for: Kathrynn Ducking, MSPIKE, SPEI   Chemistry      Component Value Date/Time   NA 140 01/22/2020 0841   NA 144 10/08/2017 0812   NA 140 07/10/2017 0838   K 4.1 01/22/2020 0841   K 3.9 10/08/2017 0812   K 3.6 07/10/2017 0838   CL 108  01/22/2020 0841   CL 109 (H) 10/08/2017 0812   CO2 25 01/22/2020 0841   CO2 25 10/08/2017 0812   CO2 23 07/10/2017 0838   BUN 12 01/22/2020 0841   BUN 9 10/08/2017 0812   BUN 9.0 07/10/2017 0838   CREATININE 0.91 01/22/2020 0841   CREATININE 0.8 10/08/2017 0812   CREATININE 0.8 07/10/2017 0838      Component Value Date/Time   CALCIUM 9.0 01/22/2020 0841   CALCIUM 9.1 10/08/2017 0812   CALCIUM 9.2 07/10/2017 0838   ALKPHOS 76 01/22/2020 0841   ALKPHOS 78 10/08/2017 0812   ALKPHOS 127 07/10/2017 0838   AST 17 01/22/2020 0841   AST 22 07/10/2017 0838   ALT 17 01/22/2020 0841   ALT 23 10/08/2017 0812   ALT 22 07/10/2017 0838   BILITOT 0.4 01/22/2020 0841   BILITOT 0.34 07/10/2017 0838       Impression and Plan: Sheri [name] is a very pleasant44yo African American female with iron, B-12 and Vit D deficiency 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  Iron studies are pending. Will replace if needed.  B 12 pending. Currently on monthly injections. Will adjust if needed.  We will plan to see her back in another 4 months.  She will contact our office with any questions or concerns. We can certainly see her sooner if needed.   Laverna Peace, NP 3/12/20219:14 AM

## 2020-01-22 NOTE — Telephone Encounter (Signed)
-----   Message from Eliezer Bottom, NP sent at 01/22/2020  3:13 PM EST ----- Ok to go to every other month B 12 injections. She gives herself. Thank you!  [name]   ----- Message ----- From: Buel Ream, [facility] [place] Sent: 01/22/2020   8:54 AM EST To: Eliezer Bottom, NP

## 2020-01-27 ENCOUNTER — Other Ambulatory Visit: Payer: 59

## 2020-01-27 ENCOUNTER — Ambulatory Visit: Payer: 59 | Admitting: Family

## 2020-01-28 ENCOUNTER — Other Ambulatory Visit: Payer: 59

## 2020-01-28 ENCOUNTER — Ambulatory Visit: Payer: 59 | Admitting: Family

## 2020-01-29 ENCOUNTER — Other Ambulatory Visit: Payer: 59

## 2020-01-29 ENCOUNTER — Ambulatory Visit: Payer: 59 | Admitting: Family

## 2020-06-10 ENCOUNTER — Other Ambulatory Visit: Payer: Self-pay | Admitting: Family

## 2020-08-30 ENCOUNTER — Other Ambulatory Visit: Payer: Self-pay | Admitting: Obstetrics & Gynecology

## 2020-08-30 DIAGNOSIS — Z01419 Encounter for gynecological examination (general) (routine) without abnormal findings: Secondary | ICD-10-CM

## 2020-08-30 NOTE — Telephone Encounter (Signed)
Medication refill request: Kaitlib FE 0.8/25mg   Last AEX:  08/21/19 Next AEX: 11/25/20 Last MMG (if hormonal medication request): NA  Refill authorized: 112/1

## 2020-09-01 ENCOUNTER — Other Ambulatory Visit: Payer: Self-pay | Admitting: Family

## 2020-09-01 DIAGNOSIS — E559 Vitamin D deficiency, unspecified: Secondary | ICD-10-CM

## 2020-11-02 ENCOUNTER — Other Ambulatory Visit: Payer: Self-pay

## 2020-11-02 ENCOUNTER — Ambulatory Visit (INDEPENDENT_AMBULATORY_CARE_PROVIDER_SITE_OTHER): Payer: No Typology Code available for payment source | Admitting: Obstetrics & Gynecology

## 2020-11-02 ENCOUNTER — Inpatient Hospital Stay: Payer: No Typology Code available for payment source | Attending: Hematology & Oncology

## 2020-11-02 ENCOUNTER — Other Ambulatory Visit (HOSPITAL_COMMUNITY)
Admission: RE | Admit: 2020-11-02 | Discharge: 2020-11-02 | Disposition: A | Discharge status: Home / Self Care | Payer: No Typology Code available for payment source | Source: Ambulatory Visit | Attending: Obstetrics & Gynecology | Admitting: Obstetrics & Gynecology

## 2020-11-02 ENCOUNTER — Encounter: Payer: Self-pay | Admitting: Obstetrics & Gynecology

## 2020-11-02 VITALS — BP 110/76 | HR 66 | Ht 65.0 in | Wt 276.0 lb

## 2020-11-02 DIAGNOSIS — Z1159 Encounter for screening for other viral diseases: Secondary | ICD-10-CM

## 2020-11-02 DIAGNOSIS — Z124 Encounter for screening for malignant neoplasm of cervix: Secondary | ICD-10-CM | POA: Insufficient documentation

## 2020-11-02 DIAGNOSIS — Z01419 Encounter for gynecological examination (general) (routine) without abnormal findings: Secondary | ICD-10-CM | POA: Diagnosis not present

## 2020-11-02 DIAGNOSIS — Z1211 Encounter for screening for malignant neoplasm of colon: Secondary | ICD-10-CM

## 2020-11-02 DIAGNOSIS — Z23 Encounter for immunization: Secondary | ICD-10-CM

## 2020-11-02 DIAGNOSIS — Z1231 Encounter for screening mammogram for malignant neoplasm of breast: Secondary | ICD-10-CM

## 2020-11-02 DIAGNOSIS — Z0000 Encounter for general adult medical examination without abnormal findings: Secondary | ICD-10-CM

## 2020-11-02 MED ORDER — NORETHIN-ETH ESTRADIOL-FE 0.8-25 MG-MCG PO CHEW: CHEWABLE_TABLET | ORAL | 4 refills | Status: DC

## 2020-11-02 NOTE — Progress Notes (Signed)
44 y.o. G2P0 Single Black or [place] American female here for annual exam.  Has recently been on z pak for upper respiratory infection.  Doesn't cycle with continuous active OCPs.  Hasn't had blood work with Dr. Marin Olp since march.  Was supposed to have 3 month follow up lab work.  She thinks her ferritin may be low.   Future orders are present in Epic.  No LMP recorded. (Menstrual status: Oral contraceptives).          Sexually active: No.  Smoker:  no  Health Maintenance: Pap:  2017 MMG:  10/05/2020 Colonoscopy:  Referral made TDaP:  2012 Hep C testing: reviewed today Screening Labs: desires today   reports that she has never smoked. She has never used smokeless tobacco. She reports that she does not drink alcohol and does not use drugs.  Past Medical History:  Diagnosis Date  . Anemia    and low B12  . Heart murmur    as child - no problems as adult  . History of blood transfusion 2004   [facility], after surgery  . Hypothyroidism    history in past, resolved  . Inflammation of joint of knee    right knee-on anti-inflammatory, arthritis  . Seasonal allergies     Past Surgical History:  Procedure Laterality Date  . ABDOMINOPLASTY  2004  . BREAST REDUCTION SURGERY  2005  . GASTRIC BYPASS  2001   Dr Duwayne Heck, in [place]  . LAPAROSCOPIC OVARIAN CYSTECTOMY Right 05/17/2014   Procedure: LAPAROSCOPIC  partial  right salpingo-oohorectomy  and cystoscopy;  Surgeon: Lyman Speller, MD;  Location: [facility];  Service: Gynecology;  Laterality: Right;  . REDUCTION MAMMAPLASTY Bilateral 2004  . WISDOM TOOTH EXTRACTION  2014   upper right    Current Outpatient Medications  Medication Sig Dispense Refill  . cyanocobalamin (,VITAMIN B-12,) 1000 MCG/ML injection INJECT 1 ML (1,000 MCG TOTAL) INTO THE MUSCLE EVERY 30 DAYS. 3 mL 4  . KAITLIB FE 0.8-25 MG-MCG tablet CHEW 1 TABLET DAILY. (TAKESCONTINUOUS ACTIVE ORAL     CONTRACEPTIVE PILLS.) 112 tablet 1  .  montelukast (SINGULAIR) 10 MG tablet Take 1 tablet (10 mg total) by mouth at bedtime. 90 tablet 4  . Vitamin D, Ergocalciferol, (DRISDOL) 1.25 MG (50000 UNIT) CAPS capsule TAKE 1 CAPSULE BY MOUTH ONE TIME PER WEEK 12 capsule 4   No current facility-administered medications for this visit.    Family History  Problem Relation Age of Onset  . CVA Maternal Grandmother   . Hypertension Brother   . Diabetes Brother   . Heart disease Brother   . Diabetes Sister   . Hypertension Sister   . Breast cancer Sister 92  . Diabetes Sister   . Hypertension Sister   . Diabetes Brother   . Hypertension Brother   . Heart disease Brother   . Diabetes Brother   . Hypertension Brother   . Heart disease Brother     Review of Systems  All other systems reviewed and are negative.   Exam:   BP 110/76   Pulse 66   Ht 5\' 5"  (1.651 m)   Wt 276 lb (125.2 kg)   BMI 45.93 kg/m   Height: 5\' 5"  (165.1 cm)  General appearance: alert, cooperative and appears stated age Head: Normocephalic, without obvious abnormality, atraumatic Neck: no adenopathy, supple, symmetrical, trachea midline and thyroid normal to inspection and palpation Lungs: clear to auscultation bilaterally Breasts: normal appearance, no masses or tenderness Heart: regular  rate and rhythm Abdomen: soft, non-tender; bowel sounds normal; no masses,  no organomegaly Extremities: extremities normal, atraumatic, no cyanosis or edema Skin: Skin color, texture, turgor normal. No rashes or lesions Lymph nodes: Cervical, supraclavicular, and axillary nodes normal. No abnormal inguinal nodes palpated Neurologic: Grossly normal   Pelvic: External genitalia:  no lesions              Urethra:  normal appearing urethra with no masses, tenderness or lesions              Bartholins and Skenes: normal                 Vagina: normal appearing vagina with normal color and discharge, no lesions              Cervix: no lesions              Pap taken:  Yes.   Bimanual Exam:  Uterus:  normal size, contour, position, consistency, mobility, non-tender              Adnexa: normal adnexa and no mass, fullness, tenderness               Rectovaginal: Confirms               Anus:  normal sphincter tone, no lesions  Chaperone, Kathrene Alu, RN, was present for exam.  Assessment/Plan:  1. Well woman exam with routine gynecological exam -  Last Pap 2017.  Obtained today - MMG  10/05/2020 - Colon cancer screening reviewed - vaccinations reviewed.  Last Tdap 2012 - Norethindrone-Ethinyl Estradiol-Fe (KAITLIB FE) 0.8-25 MG-MCG tablet; CHEW 1 TABLET DAILY. (TAKESCONTINUOUS ACTIVE ORAL     CONTRACEPTIVE PILLS.)  Dispense: 112 tablet; Refill: 4 - flu vaccination given today.  2. Encounter for screening mammogram for malignant neoplasm of breast - MM 3D SCREEN BREAST BILATERAL; Future.  Order placed.  3. Colon cancer screening - Ambulatory referral to Gastroenterology placed  4. Encounter for hepatitis C screening test for low risk patient - HIV Antibody (routine testing w rflx) - Hepatitis C antibody  5. Blood tests for routine general physical examination - Lipid panel  6.  H/o gastric bypass, now with malabsorption, B12 deficiency, chronically low iron - will go to Dr. Antonieta Pert office today to see about having lab work drawn that is in future orders as this is very overdue.

## 2020-11-02 NOTE — Progress Notes (Signed)
Patient presents for annual exam

## 2020-11-03 ENCOUNTER — Encounter: Payer: Self-pay | Admitting: Obstetrics & Gynecology

## 2020-11-03 LAB — HIV ANTIBODY (ROUTINE TESTING W REFLEX): HIV Screen 4th Generation wRfx: NONREACTIVE

## 2020-11-03 LAB — HEPATITIS C ANTIBODY: Hep C Virus Ab: <0.1 s/co ratio (ref 0.0–0.9)

## 2020-11-03 LAB — LIPID PANEL
Chol/HDL Ratio: 3.2 ratio (ref 0.0–4.4)
Cholesterol, Total: 157 mg/dL (ref 100–199)
HDL: 49 mg/dL (ref >39)
LDL Chol Calc (NIH): 96 mg/dL (ref 0–99)
Triglycerides: 59 mg/dL (ref 0–149)
VLDL Cholesterol Cal: 12 mg/dL (ref 5–40)

## 2020-11-08 LAB — CYTOLOGY - PAP
Comment: NEGATIVE
Diagnosis: NEGATIVE
High risk HPV: NEGATIVE

## 2020-11-17 ENCOUNTER — Telehealth: Payer: Self-pay | Admitting: Family

## 2020-11-17 ENCOUNTER — Encounter: Payer: Self-pay | Admitting: Family

## 2020-11-17 ENCOUNTER — Inpatient Hospital Stay (HOSPITAL_BASED_OUTPATIENT_CLINIC_OR_DEPARTMENT_OTHER): Payer: No Typology Code available for payment source | Admitting: Family

## 2020-11-17 ENCOUNTER — Inpatient Hospital Stay: Payer: No Typology Code available for payment source | Attending: Hematology & Oncology

## 2020-11-17 ENCOUNTER — Other Ambulatory Visit: Payer: Self-pay

## 2020-11-17 VITALS — BP 119/65 | HR 63 | Temp 98.2°F | Resp 17 | Ht 65.0 in | Wt 277.0 lb

## 2020-11-17 DIAGNOSIS — D508 Other iron deficiency anemias: Secondary | ICD-10-CM | POA: Diagnosis not present

## 2020-11-17 DIAGNOSIS — R200 Anesthesia of skin: Secondary | ICD-10-CM | POA: Diagnosis not present

## 2020-11-17 DIAGNOSIS — R5383 Other fatigue: Secondary | ICD-10-CM | POA: Diagnosis not present

## 2020-11-17 DIAGNOSIS — R0602 Shortness of breath: Secondary | ICD-10-CM | POA: Diagnosis not present

## 2020-11-17 DIAGNOSIS — E538 Deficiency of other specified B group vitamins: Secondary | ICD-10-CM | POA: Diagnosis not present

## 2020-11-17 DIAGNOSIS — E559 Vitamin D deficiency, unspecified: Secondary | ICD-10-CM | POA: Insufficient documentation

## 2020-11-17 DIAGNOSIS — K909 Intestinal malabsorption, unspecified: Secondary | ICD-10-CM | POA: Insufficient documentation

## 2020-11-17 DIAGNOSIS — R202 Paresthesia of skin: Secondary | ICD-10-CM | POA: Diagnosis not present

## 2020-11-17 DIAGNOSIS — D510 Vitamin B12 deficiency anemia due to intrinsic factor deficiency: Secondary | ICD-10-CM | POA: Diagnosis not present

## 2020-11-17 DIAGNOSIS — D509 Iron deficiency anemia, unspecified: Secondary | ICD-10-CM | POA: Diagnosis not present

## 2020-11-17 DIAGNOSIS — Z9884 Bariatric surgery status: Secondary | ICD-10-CM | POA: Diagnosis present

## 2020-11-17 LAB — CBC WITH DIFFERENTIAL (CANCER CENTER ONLY)
Abs Immature Granulocytes: 0.07 10*3/uL (ref 0.00–0.07)
Basophils Absolute: 0.1 10*3/uL (ref 0.0–0.1)
Basophils Relative: 1 %
Eosinophils Absolute: 0.6 10*3/uL — ABNORMAL HIGH (ref 0.0–0.5)
Eosinophils Relative: 9 %
HCT: 41.9 % (ref 36.0–46.0)
Hemoglobin: 13.3 g/dL (ref 12.0–15.0)
Immature Granulocytes: 1 %
Lymphocytes Relative: 29 %
Lymphs Abs: 1.9 10*3/uL (ref 0.7–4.0)
MCH: 27.7 pg (ref 26.0–34.0)
MCHC: 31.7 g/dL (ref 30.0–36.0)
MCV: 87.3 fL (ref 80.0–100.0)
Monocytes Absolute: 0.8 10*3/uL (ref 0.1–1.0)
Monocytes Relative: 11 %
Neutro Abs: 3.3 10*3/uL (ref 1.7–7.7)
Neutrophils Relative %: 49 %
Platelet Count: 300 10*3/uL (ref 150–400)
RBC: 4.8 MIL/uL (ref 3.87–5.11)
RDW: 13.9 % (ref 11.5–15.5)
WBC Count: 6.7 10*3/uL (ref 4.0–10.5)
nRBC: 0 % (ref 0.0–0.2)

## 2020-11-17 LAB — CMP (CANCER CENTER ONLY)
ALT: 25 U/L (ref 0–44)
AST: 25 U/L (ref 15–41)
Albumin: 3.8 g/dL (ref 3.5–5.0)
Alkaline Phosphatase: 76 U/L (ref 38–126)
Anion gap: 6 (ref 5–15)
BUN: 15 mg/dL (ref 6–20)
CO2: 27 mmol/L (ref 22–32)
Calcium: 9.7 mg/dL (ref 8.9–10.3)
Chloride: 106 mmol/L (ref 98–111)
Creatinine: 0.87 mg/dL (ref 0.44–1.00)
GFR, Estimated: >60 mL/min (ref >60)
Glucose, Bld: 93 mg/dL (ref 70–99)
Potassium: 3.8 mmol/L (ref 3.5–5.1)
Sodium: 139 mmol/L (ref 135–145)
Total Bilirubin: 0.3 mg/dL (ref 0.3–1.2)
Total Protein: 7.2 g/dL (ref 6.5–8.1)

## 2020-11-17 LAB — IRON AND TIBC
Iron: 121 ug/dL (ref 41–142)
Saturation Ratios: 30 % (ref 21–57)
TIBC: 404 ug/dL (ref 236–444)
UIBC: 283 ug/dL (ref 120–384)

## 2020-11-17 LAB — VITAMIN D 25 HYDROXY (VIT D DEFICIENCY, FRACTURES): Vit D, 25-Hydroxy: 51.7 ng/mL (ref 30–100)

## 2020-11-17 LAB — RETICULOCYTES
Immature Retic Fract: 4.5 % (ref 2.3–15.9)
RBC.: 4.78 MIL/uL (ref 3.87–5.11)
Retic Count, Absolute: 44.9 10*3/uL (ref 19.0–186.0)
Retic Ct Pct: 0.9 % (ref 0.4–3.1)

## 2020-11-17 LAB — VITAMIN B12: Vitamin B-12: 383 pg/mL (ref 180–914)

## 2020-11-17 LAB — FERRITIN: Ferritin: 24 ng/mL (ref 11–307)

## 2020-11-17 MED ORDER — CYANOCOBALAMIN 1000 MCG/ML IJ SOLN: INTRAMUSCULAR | 1 refills | Status: DC

## 2020-11-17 NOTE — Telephone Encounter (Signed)
Appointments scheduled patient will get info from My Chart per 1/6 los

## 2020-11-17 NOTE — Progress Notes (Signed)
Hematology and Oncology Follow Up Visit  [name] [id number] June 03, 1976 45 y.o. 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 is here today for follow-up. She is symptomatic with fatigue, SOB with over exertion and occasional numbness and tingling in her hands and feet.  She states that she has not been able to sleep good for about 6 months.  No fever, chills, n/v, cough, rash, dizziness, chest pain, palpitations, abdominal pain or changes in bowel or bladder habits.  No swelling, in her extremities at this time. She notes occasional puffiness in her hands and feet. This comes and goes.  No falls or syncopal episodes to report.  She has maintained a good appetite and is staying well hydrated. Her weight is stable at 277 lbs.  She has been able to walk regularly for exercise.   ECOG Performance Status: 1 - Symptomatic but completely ambulatory  Medications:  Allergies as of 11/17/2020      Reactions   Aspirin Other (See Comments)   Pt. Has had gastric bypass surgery.  ASA causes ulcers.       Medication List       Accurate as of November 17, 2020 10:15 AM. If you have any questions, ask your nurse or doctor.        cyanocobalamin 1000 MCG/ML injection Commonly known as: (VITAMIN B-12) INJECT 1 ML (1,000 MCG TOTAL) INTO THE MUSCLE EVERY 30 DAYS.   montelukast 10 MG tablet Commonly known as: SINGULAIR Take 1 tablet (10 mg total) by mouth at bedtime.   Norethindrone-Ethinyl Estradiol-Fe 0.8-25 MG-MCG tablet Commonly known as: Kaitlib Fe CHEW 1 TABLET DAILY. (TAKESCONTINUOUS ACTIVE ORAL     CONTRACEPTIVE PILLS.)   Vitamin D (Ergocalciferol) 1.25 MG (50000 UNIT) Caps capsule Commonly known as: DRISDOL TAKE 1 CAPSULE BY MOUTH ONE TIME PER WEEK        Allergies:  Allergies  Allergen Reactions  . Aspirin Other (See Comments)    Pt. Has had gastric bypass surgery.  ASA causes ulcers.     Past Medical History, Surgical history, Social history, and Family History were reviewed and updated.  Review of Systems: All other 10 point review of systems is negative.   Physical Exam:  vitals were not taken for this visit.   Wt Readings from Last 3 Encounters:  11/02/20 276 lb (125.2 kg)  01/22/20 276 lb 12.8 oz (125.6 kg)  10/02/19 276 lb 12 oz (125.5 kg)    Ocular: Sclerae unicteric, pupils equal, round and reactive to light Ear-nose-throat: Oropharynx clear, dentition fair Lymphatic: No cervical or supraclavicular adenopathy Lungs no rales or rhonchi, good excursion bilaterally Heart regular rate and rhythm, no murmur appreciated Abd soft, nontender, positive bowel sounds MSK no focal spinal tenderness, no joint edema Neuro: non-focal, well-oriented, appropriate affect Breasts: Deferred    Lab Results  Component Value Date   WBC 6.7 11/17/2020   HGB 13.3 11/17/2020   HCT 41.9 11/17/2020   MCV 87.3 11/17/2020   PLT 300 11/17/2020   Lab Results  Component Value Date   FERRITIN 35 01/22/2020   IRON 124 01/22/2020   TIBC 348 01/22/2020   UIBC 223 01/22/2020   IRONPCTSAT 36 01/22/2020   Lab Results  Component Value Date   RETICCTPCT 0.9 11/17/2020  RBC 4.78 11/17/2020   RBC 4.80 11/17/2020   No results found for: KPAFRELGTCHN, LAMBDASER, KAPLAMBRATIO No results found for: IGGSERUM, IGA, IGMSERUM No results found for: Georgann Housekeeper, MSPIKE, SPEI   Chemistry      Component Value Date/Time   NA 140 01/22/2020 0841   NA 144 10/08/2017 0812   NA 140 07/10/2017 0838   K 4.1 01/22/2020 0841   K 3.9 10/08/2017 0812   K 3.6 07/10/2017 0838   CL 108 01/22/2020 0841   CL 109 (H) 10/08/2017 0812   CO2 25 01/22/2020 0841   CO2 25 10/08/2017 0812   CO2 23 07/10/2017 0838    BUN 12 01/22/2020 0841   BUN 9 10/08/2017 0812   BUN 9.0 07/10/2017 0838   CREATININE 0.91 01/22/2020 0841   CREATININE 0.8 10/08/2017 0812   CREATININE 0.8 07/10/2017 0838      Component Value Date/Time   CALCIUM 9.0 01/22/2020 0841   CALCIUM 9.1 10/08/2017 0812   CALCIUM 9.2 07/10/2017 0838   ALKPHOS 76 01/22/2020 0841   ALKPHOS 78 10/08/2017 0812   ALKPHOS 127 07/10/2017 0838   AST 17 01/22/2020 0841   AST 22 07/10/2017 0838   ALT 17 01/22/2020 0841   ALT 23 10/08/2017 0812   ALT 22 07/10/2017 0838   BILITOT 0.4 01/22/2020 0841   BILITOT 0.34 07/10/2017 0838       Impression and Plan: [name] is a very pleasant44yo African American female with iron, B-12 and Vit D deficiency 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 B 12 refilled.  Iron studies are pending. We can replace if needed.  Follow-up in 4 months.  She can contact our office with any questions or concerns.  Emeline Gins, NP 1/6/202210:15 AM

## 2020-11-25 ENCOUNTER — Ambulatory Visit: Payer: 59 | Admitting: Obstetrics & Gynecology

## 2020-12-13 ENCOUNTER — Ambulatory Visit
Admission: RE | Admit: 2020-12-13 | Discharge: 2020-12-13 | Disposition: A | Discharge status: Home / Self Care | Payer: No Typology Code available for payment source | Source: Ambulatory Visit | Attending: Obstetrics & Gynecology | Admitting: Obstetrics & Gynecology

## 2020-12-13 ENCOUNTER — Other Ambulatory Visit: Payer: Self-pay

## 2020-12-13 DIAGNOSIS — Z1231 Encounter for screening mammogram for malignant neoplasm of breast: Secondary | ICD-10-CM

## 2021-01-09 ENCOUNTER — Ambulatory Visit (AMBULATORY_SURGERY_CENTER): Payer: No Typology Code available for payment source

## 2021-01-09 ENCOUNTER — Other Ambulatory Visit: Payer: Self-pay

## 2021-01-09 VITALS — Ht 65.0 in | Wt 265.0 lb

## 2021-01-09 DIAGNOSIS — Z1211 Encounter for screening for malignant neoplasm of colon: Secondary | ICD-10-CM

## 2021-01-09 MED ORDER — PLENVU 140 G PO SOLR: 1.0000 | ORAL | 0 refills | Status: DC

## 2021-01-09 NOTE — Progress Notes (Signed)
Pre visit completed via phone call;  Patient verified name, DOB, and address; No egg or soy allergy known to patient;  Patient reports issues with past sedation with any surgeries or procedures-patient reports she "comes out fighting" from anesthesia--denies PTSD or abuse; same anesthesia issues with father; No intubation problems in the past;  No FH of Malignant Hyperthermia No diet pills per patient No home 02 use per patient  No blood thinners per patient  Pt denies issues with constipation at this time; No A fib or A flutter  EMMI video via [place] 19 guidelines implemented in PV today with Pt and RN  Pt denies loose or missing teeth, dentures, partials, dental implants, or bonded teeth; patient reports crown/capped teeth; Coupon given to pt in PV today, Code to Pharmacy and  NO PA's for preps discussed with pt in PV today  Discussed with pt there will be an out-of-pocket cost for prep and that varies from $0 to 70 dollars  Due to the COVID-19 pandemic we are asking patients to follow certain guidelines. Pt aware of COVID protocols and [facility] guidelines

## 2021-01-23 ENCOUNTER — Encounter: Payer: Self-pay | Admitting: Gastroenterology

## 2021-02-06 ENCOUNTER — Other Ambulatory Visit: Payer: Self-pay

## 2021-02-06 ENCOUNTER — Encounter: Payer: Self-pay | Admitting: Gastroenterology

## 2021-02-06 ENCOUNTER — Ambulatory Visit (AMBULATORY_SURGERY_CENTER): Payer: No Typology Code available for payment source | Admitting: Gastroenterology

## 2021-02-06 VITALS — BP 114/74 | HR 69 | Temp 97.8°F | Resp 15 | Ht 65.0 in | Wt 265.0 lb

## 2021-02-06 DIAGNOSIS — Z1211 Encounter for screening for malignant neoplasm of colon: Secondary | ICD-10-CM | POA: Diagnosis not present

## 2021-02-06 DIAGNOSIS — D123 Benign neoplasm of transverse colon: Secondary | ICD-10-CM

## 2021-02-06 MED ORDER — SODIUM CHLORIDE 0.9 % IV SOLN: 500.0000 mL | INTRAVENOUS | Status: DC

## 2021-02-06 NOTE — Progress Notes (Signed)
Called to room to assist during endoscopic procedure.  Patient ID and intended procedure confirmed with present staff. Received instructions for my participation in the procedure from the performing physician.  

## 2021-02-06 NOTE — Progress Notes (Signed)
Report given to PACU, vss 

## 2021-02-06 NOTE — Progress Notes (Signed)
1520 Robinul 0.2 mg IV given due large amount of secretions upon assessment.  MD made aware, vss

## 2021-02-06 NOTE — Patient Instructions (Addendum)
Handout was given to your care partner on polyps.  Unfortunately, we were not able to retrieve the polyp to send to pathology.  You will not receive a letter about pathology. You may resume your current medications today. Repeat colonoscopy in 7 years per Dr. Ardis Hughs. Please call if any questions or concerns.     YOU HAD AN ENDOSCOPIC PROCEDURE TODAY AT [facility]:   Refer to the procedure report that was given to you for any specific questions about what was found during the examination.  If the procedure report does not answer your questions, please call your gastroenterologist to clarify.  If you requested that your care partner not be given the details of your procedure findings, then the procedure report has been included in a sealed envelope for you to review at your convenience later.  YOU SHOULD EXPECT: Some feelings of bloating in the abdomen. Passage of more gas than usual.  Walking can help get rid of the air that was put into your GI tract during the procedure and reduce the bloating. If you had a lower endoscopy (such as a colonoscopy or flexible sigmoidoscopy) you may notice spotting of blood in your stool or on the toilet paper. If you underwent a bowel prep for your procedure, you may not have a normal bowel movement for a few days.  Please Note:  You might notice some irritation and congestion in your nose or some drainage.  This is from the oxygen used during your procedure.  There is no need for concern and it should clear up in a day or so.  SYMPTOMS TO REPORT IMMEDIATELY:   Following lower endoscopy (colonoscopy or flexible sigmoidoscopy):  Excessive amounts of blood in the stool  Significant tenderness or worsening of abdominal pains  Swelling of the abdomen that is new, acute  Fever of 100F or higher   For urgent or emergent issues, a gastroenterologist can be reached at any hour by calling [phone]. Do not use MyChart messaging for urgent  concerns.    DIET:  We do recommend a small meal at first, but then you may proceed to your regular diet.  Drink plenty of fluids but you should avoid alcoholic beverages for 24 hours.  ACTIVITY:  You should plan to take it easy for the rest of today and you should NOT DRIVE or use heavy machinery until tomorrow (because of the sedation medicines used during the test).    FOLLOW UP: Our staff will call the number listed on your records 48-72 hours following your procedure to check on you and address any questions or concerns that you may have regarding the information given to you following your procedure. If we do not reach you, we will leave a message.  We will attempt to reach you two times.  During this call, we will ask if you have developed any symptoms of COVID 19. If you develop any symptoms (ie: fever, flu-like symptoms, shortness of breath, cough etc.) before then, please call [phone].  If you test positive for Covid 19 in the 2 weeks post procedure, please call and report this information to [place].    If any biopsies were taken you will be contacted by phone or by letter within the next 1-3 weeks.  Please call us at [phone] if you have not heard about the biopsies in 3 weeks.    SIGNATURES/CONFIDENTIALITY: You and/or your care partner have signed paperwork which will be entered into your electronic medical  record.  These signatures attest to the fact that that the information above on your After Visit Summary has been reviewed and is understood.  Full responsibility of the confidentiality of this discharge information lies with you and/or your care-partner. 

## 2021-02-06 NOTE — Op Note (Signed)
[facility] Patient Name: Sheri [name] Procedure Date: 02/06/2021 3:16 PM MRN: [id number] Endoscopist: Milus Banister , MD Age: 45 Referring MD:  Date of Birth: 25-Dec-1975 Gender: Female Account #: [id number] Procedure:                Colonoscopy Indications:              Screening for colorectal malignant neoplasm Medicines:                Monitored Anesthesia Care Procedure:                Pre-Anesthesia Assessment:                           - Prior to the procedure, a History and Physical                            was performed, and patient medications and                            allergies were reviewed. The patient's tolerance of                            previous anesthesia was also reviewed. The risks                            and benefits of the procedure and the sedation                            options and risks were discussed with the patient.                            All questions were answered, and informed consent                            was obtained. Prior Anticoagulants: The patient has                            taken no previous anticoagulant or antiplatelet                            agents. ASA Grade Assessment: III - A patient with                            severe systemic disease. After reviewing the risks                            and benefits, the patient was deemed in                            satisfactory condition to undergo the procedure.                           After obtaining informed consent, the colonoscope  was passed under direct vision. Throughout the                            procedure, the patient's blood pressure, pulse, and                            oxygen saturations were monitored continuously. The                            Olympus [id number]-[id number]) Colonoscope was                            introduced through the anus and advanced to the the                            cecum,  identified by appendiceal orifice and                            ileocecal valve. The colonoscopy was performed                            without difficulty. The patient tolerated the                            procedure well. The quality of the bowel                            preparation was good. The ileocecal valve,                            appendiceal orifice, and rectum were photographed. Scope In: 3:20:40 PM Scope Out: 3:31:12 PM Scope Withdrawal Time: 0 hours 7 minutes 47 seconds  Total Procedure Duration: 0 hours 10 minutes 32 seconds  Findings:                 A 5 mm polyp was found in the transverse colon. The                            polyp was sessile, appeared adenomatous. The polyp                            was removed with a cold snare. Resection was                            complete, but the polyp tissue was not retrieved.                           The exam was otherwise without abnormality on                            direct and retroflexion views. Complications:            No immediate complications. Estimated blood loss:  None. Estimated Blood Loss:     Estimated blood loss: none. Impression:               - One 5 mm polyp in the transverse colon, removed                            with a cold snare. Complete resection. The polyp                            was clearly adenoamtous, however polyp tissue not                            retrieved.                           - The examination was otherwise normal on direct                            and retroflexion views. Recommendation:           - Patient has a contact number available for                            emergencies. The signs and symptoms of potential                            delayed complications were discussed with the                            patient. Return to normal activities tomorrow.                            Written discharge instructions were provided to the                             patient.                           - Resume previous diet.                           - Continue present medications.                           - Repeat colonoscopy in 7 years for surveillance. Milus Banister, MD 02/06/2021 3:35:50 PM This report has been signed electronically.

## 2021-02-06 NOTE — Progress Notes (Signed)
Pt's states no medical or surgical changes since previsit or office visit.  CW vitals and JD IV.

## 2021-02-06 NOTE — Progress Notes (Signed)
No problems noted in the recovery room. maw 

## 2021-02-08 ENCOUNTER — Telehealth: Payer: Self-pay

## 2021-02-08 NOTE — Telephone Encounter (Signed)
  Follow up Call-  Call back number 02/06/2021  Post procedure Call Back phone  # [phone]  Permission to leave phone message Yes  Some recent data might be hidden     Patient questions:  Do you have a fever, pain , or abdominal swelling? No. Pain Score  0 *  Have you tolerated food without any problems? Yes.    Have you been able to return to your normal activities? Yes.    Do you have any questions about your discharge instructions: Diet   No. Medications  No. Follow up visit  No.  Do you have questions or concerns about your Care? No.  Actions: * If pain score is 4 or above: No action needed, pain <4. 1. Have you developed a fever since your procedure? no  2.   Have you had an respiratory symptoms (SOB or cough) since your procedure? no  3.   Have you tested positive for COVID 19 since your procedure no  4.   Have you had any family members/close contacts diagnosed with the COVID 19 since your procedure?  no   If yes to any of these questions please route to Joylene John, RN and Joella Prince, RN

## 2021-03-17 ENCOUNTER — Other Ambulatory Visit: Payer: Self-pay | Admitting: Family

## 2021-03-17 ENCOUNTER — Inpatient Hospital Stay (HOSPITAL_BASED_OUTPATIENT_CLINIC_OR_DEPARTMENT_OTHER): Payer: No Typology Code available for payment source | Admitting: Family

## 2021-03-17 ENCOUNTER — Other Ambulatory Visit: Payer: Self-pay

## 2021-03-17 ENCOUNTER — Inpatient Hospital Stay: Payer: No Typology Code available for payment source | Attending: Hematology & Oncology

## 2021-03-17 ENCOUNTER — Encounter: Payer: Self-pay | Admitting: Family

## 2021-03-17 ENCOUNTER — Telehealth: Payer: Self-pay

## 2021-03-17 VITALS — BP 112/78 | HR 61 | Resp 17 | Ht 65.0 in | Wt 277.8 lb

## 2021-03-17 DIAGNOSIS — D510 Vitamin B12 deficiency anemia due to intrinsic factor deficiency: Secondary | ICD-10-CM

## 2021-03-17 DIAGNOSIS — K909 Intestinal malabsorption, unspecified: Secondary | ICD-10-CM | POA: Insufficient documentation

## 2021-03-17 DIAGNOSIS — E559 Vitamin D deficiency, unspecified: Secondary | ICD-10-CM

## 2021-03-17 DIAGNOSIS — D508 Other iron deficiency anemias: Secondary | ICD-10-CM

## 2021-03-17 DIAGNOSIS — Z9884 Bariatric surgery status: Secondary | ICD-10-CM | POA: Insufficient documentation

## 2021-03-17 DIAGNOSIS — Z79899 Other long term (current) drug therapy: Secondary | ICD-10-CM | POA: Insufficient documentation

## 2021-03-17 DIAGNOSIS — D509 Iron deficiency anemia, unspecified: Secondary | ICD-10-CM | POA: Insufficient documentation

## 2021-03-17 LAB — CBC WITH DIFFERENTIAL (CANCER CENTER ONLY)
Abs Immature Granulocytes: 0.05 10*3/uL (ref 0.00–0.07)
Basophils Absolute: 0.1 10*3/uL (ref 0.0–0.1)
Basophils Relative: 1 %
Eosinophils Absolute: 0.1 10*3/uL (ref 0.0–0.5)
Eosinophils Relative: 2 %
HCT: 38.6 % (ref 36.0–46.0)
Hemoglobin: 12.5 g/dL (ref 12.0–15.0)
Immature Granulocytes: 1 %
Lymphocytes Relative: 27 %
Lymphs Abs: 1.7 10*3/uL (ref 0.7–4.0)
MCH: 27.4 pg (ref 26.0–34.0)
MCHC: 32.4 g/dL (ref 30.0–36.0)
MCV: 84.6 fL (ref 80.0–100.0)
Monocytes Absolute: 0.7 10*3/uL (ref 0.1–1.0)
Monocytes Relative: 11 %
Neutro Abs: 3.7 10*3/uL (ref 1.7–7.7)
Neutrophils Relative %: 58 %
Platelet Count: 267 10*3/uL (ref 150–400)
RBC: 4.56 MIL/uL (ref 3.87–5.11)
RDW: 14 % (ref 11.5–15.5)
WBC Count: 6.3 10*3/uL (ref 4.0–10.5)
nRBC: 0 % (ref 0.0–0.2)

## 2021-03-17 LAB — IRON AND TIBC
Iron: 86 ug/dL (ref 41–142)
Saturation Ratios: 21 % (ref 21–57)
TIBC: 405 ug/dL (ref 236–444)
UIBC: 319 ug/dL (ref 120–384)

## 2021-03-17 LAB — CMP (CANCER CENTER ONLY)
ALT: 21 U/L (ref 0–44)
AST: 19 U/L (ref 15–41)
Albumin: 3.7 g/dL (ref 3.5–5.0)
Alkaline Phosphatase: 84 U/L (ref 38–126)
Anion gap: 6 (ref 5–15)
BUN: 13 mg/dL (ref 6–20)
CO2: 25 mmol/L (ref 22–32)
Calcium: 9.2 mg/dL (ref 8.9–10.3)
Chloride: 107 mmol/L (ref 98–111)
Creatinine: 0.8 mg/dL (ref 0.44–1.00)
GFR, Estimated: >60 mL/min (ref >60)
Glucose, Bld: 94 mg/dL (ref 70–99)
Potassium: 3.9 mmol/L (ref 3.5–5.1)
Sodium: 138 mmol/L (ref 135–145)
Total Bilirubin: 0.4 mg/dL (ref 0.3–1.2)
Total Protein: 6.8 g/dL (ref 6.5–8.1)

## 2021-03-17 LAB — RETICULOCYTES
Immature Retic Fract: 8.8 % (ref 2.3–15.9)
RBC.: 4.55 MIL/uL (ref 3.87–5.11)
Retic Count, Absolute: 56 10*3/uL (ref 19.0–186.0)
Retic Ct Pct: 1.2 % (ref 0.4–3.1)

## 2021-03-17 LAB — VITAMIN D 25 HYDROXY (VIT D DEFICIENCY, FRACTURES): Vit D, 25-Hydroxy: 23.38 ng/mL — ABNORMAL LOW (ref 30–100)

## 2021-03-17 LAB — VITAMIN B12: Vitamin B-12: 252 pg/mL (ref 180–914)

## 2021-03-17 LAB — FERRITIN: Ferritin: 16 ng/mL (ref 11–307)

## 2021-03-17 MED ORDER — CYANOCOBALAMIN 1000 MCG/ML IJ SOLN: INTRAMUSCULAR | 2 refills | Status: DC

## 2021-03-17 MED ORDER — VITAMIN D (ERGOCALCIFEROL) 1.25 MG (50000 UNIT) PO CAPS: ORAL_CAPSULE | ORAL | 4 refills | Status: DC

## 2021-03-17 NOTE — Telephone Encounter (Signed)
-----   Message from Eliezer Bottom, NP sent at 03/17/2021  1:16 PM EDT ----- Is she taking her Vit D supplement?   ----- Message ----- From: Buel Ream, Lab In [place] Sent: 03/17/2021   8:34 AM EDT To: Eliezer Bottom, NP

## 2021-03-17 NOTE — Progress Notes (Signed)
Hematology and Oncology Follow Up Visit  Sheri [name] [id number] 12-09-1975 45 y.o. 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 She is symptomatic with fatigue and occasional headaches.  No fever, chills, cough, rash, chest pain, palpitations, abdominal pain or changes in bowel or bladder habits.  She has occasional blood in her stool. She had a colonoscopy in March and had one polyp removed. No other blood loss noted. No bruising or petechiae.  She has puffiness in her hands and feet that comes and goes.  She has intermittent numbness and tingling in her hands and feet.  She did have a fall in January and is unsure if she got dizzy or tripped. Thankfully, she was not seriously injured.  She has an ok appetite and is staying well hydrated. Her weight is stable at 277 lbs.   ECOG Performance Status: 1 - Symptomatic but completely ambulatory  Medications:  Allergies as of 03/17/2021      Reactions   Aspirin Other (See Comments)   Pt. Has had gastric bypass surgery.  ASA causes ulcers.       Medication List       Accurate as of Mar 17, 2021  8:32 AM. If you have any questions, ask your nurse or doctor.        cyanocobalamin 1000 MCG/ML injection Commonly known as: (VITAMIN B-12) INJECT 1 ML (1,000 MCG TOTAL) INTO THE MUSCLE EVERY 30 DAYS.   montelukast 10 MG tablet Commonly known as: SINGULAIR Take 1 tablet (10 mg total) by mouth at bedtime.   Norethindrone-Ethinyl Estradiol-Fe 0.8-25 MG-MCG tablet Commonly known as: Kaitlib Fe CHEW 1 TABLET DAILY. (TAKESCONTINUOUS ACTIVE ORAL     CONTRACEPTIVE PILLS.)   Vitamin D (Ergocalciferol) 1.25 MG (50000 UNIT) Caps capsule Commonly known as: DRISDOL TAKE 1 CAPSULE BY MOUTH ONE TIME  PER WEEK       Allergies:  Allergies  Allergen Reactions  . Aspirin Other (See Comments)    Pt. Has had gastric bypass surgery.  ASA causes ulcers.     Past Medical History, Surgical history, Social history, and Family History were reviewed and updated.  Review of Systems: All other 10 point review of systems is negative.   Physical Exam:  vitals were not taken for this visit.   Wt Readings from Last 3 Encounters:  02/06/21 265 lb (120.2 kg)  01/09/21 265 lb (120.2 kg)  11/17/20 277 lb (125.6 kg)    Ocular: Sclerae unicteric, pupils equal, round and reactive to light Ear-nose-throat: Oropharynx clear, dentition fair Lymphatic: No cervical or supraclavicular adenopathy Lungs no rales or rhonchi, good excursion bilaterally Heart regular rate and rhythm, no murmur appreciated Abd soft, nontender, positive bowel sounds MSK no focal spinal tenderness, no joint edema Neuro: non-focal, well-oriented, appropriate affect Breasts: Deferred   Lab Results  Component Value Date   WBC 6.7 11/17/2020   HGB 13.3 11/17/2020   HCT 41.9 11/17/2020   MCV 87.3 11/17/2020   PLT 300 11/17/2020   Lab Results  Component Value Date   FERRITIN 24 11/17/2020   IRON 121 11/17/2020   TIBC 404 11/17/2020   UIBC 283 11/17/2020   IRONPCTSAT 30 11/17/2020   Lab Results  Component Value Date   RETICCTPCT 0.9 11/17/2020   RBC 4.78 11/17/2020   RBC  4.80 11/17/2020   No results found for: KPAFRELGTCHN, LAMBDASER, KAPLAMBRATIO No results found for: IGGSERUM, IGA, IGMSERUM No results found for: Kathrynn Ducking, MSPIKE, SPEI   Chemistry      Component Value Date/Time   NA 139 11/17/2020 0959   NA 144 10/08/2017 0812   NA 140 07/10/2017 0838   K 3.8 11/17/2020 0959   K 3.9 10/08/2017 0812   K 3.6 07/10/2017 0838   CL 106 11/17/2020 0959   CL 109 (H) 10/08/2017 0812   CO2 27 11/17/2020 0959   CO2 25 10/08/2017 0812   CO2 23 07/10/2017 0838    BUN 15 11/17/2020 0959   BUN 9 10/08/2017 0812   BUN 9.0 07/10/2017 0838   CREATININE 0.87 11/17/2020 0959   CREATININE 0.8 10/08/2017 0812   CREATININE 0.8 07/10/2017 0838      Component Value Date/Time   CALCIUM 9.7 11/17/2020 0959   CALCIUM 9.1 10/08/2017 0812   CALCIUM 9.2 07/10/2017 0838   ALKPHOS 76 11/17/2020 0959   ALKPHOS 78 10/08/2017 0812   ALKPHOS 127 07/10/2017 0838   AST 25 11/17/2020 0959   AST 22 07/10/2017 0838   ALT 25 11/17/2020 0959   ALT 23 10/08/2017 0812   ALT 22 07/10/2017 0838   BILITOT 0.3 11/17/2020 0959   BILITOT 0.34 07/10/2017 0838       Impression and Plan: Sheri [name] is a very pleasant44yo African American female with iron, B-12 and Vit D deficiency 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 B 12 refilled.  Iron studies are pending. We will replace if needed.  Follow-up in 6 months.  She can contact our office with any questions or concerns.   Sheri Peace, NP 5/6/20228:32 AM

## 2021-03-17 NOTE — Telephone Encounter (Signed)
Called and confirmed with patient she is taking her Vitamin D once a week. Per Judson Roch.NP patient to continue taking it weekly for now. Refill sent per patient request. Patient inquired about iron study results, Judson Roch reviewed and states they look great. Patient verbalized understanding and denies any other questions or concerns at this time.

## 2021-03-27 ENCOUNTER — Other Ambulatory Visit: Payer: Self-pay | Admitting: Obstetrics & Gynecology

## 2021-03-27 DIAGNOSIS — Z01419 Encounter for gynecological examination (general) (routine) without abnormal findings: Secondary | ICD-10-CM

## 2021-03-27 NOTE — Telephone Encounter (Signed)
We received a Rx request for Norethindrone-Ethinyl Estradiol-Fe from [facility] for 90 day supply. I called patient to inquire if she ordered this to be sent into the pharmacy or not. Patient states she did not order that and does not know why they sent it. She has plenty at this time and will let us know when she needs a refill. tbw

## 2021-05-03 ENCOUNTER — Encounter: Payer: Self-pay | Admitting: Family

## 2021-05-03 ENCOUNTER — Other Ambulatory Visit: Payer: Self-pay | Admitting: *Deleted

## 2021-05-03 DIAGNOSIS — D500 Iron deficiency anemia secondary to blood loss (chronic): Secondary | ICD-10-CM

## 2021-05-04 ENCOUNTER — Inpatient Hospital Stay: Payer: No Typology Code available for payment source | Attending: Hematology & Oncology

## 2021-05-04 ENCOUNTER — Other Ambulatory Visit: Payer: Self-pay

## 2021-05-04 DIAGNOSIS — D508 Other iron deficiency anemias: Secondary | ICD-10-CM | POA: Insufficient documentation

## 2021-05-04 DIAGNOSIS — Z79899 Other long term (current) drug therapy: Secondary | ICD-10-CM | POA: Diagnosis not present

## 2021-05-04 DIAGNOSIS — E559 Vitamin D deficiency, unspecified: Secondary | ICD-10-CM | POA: Insufficient documentation

## 2021-05-04 DIAGNOSIS — Z9884 Bariatric surgery status: Secondary | ICD-10-CM | POA: Insufficient documentation

## 2021-05-04 DIAGNOSIS — K909 Intestinal malabsorption, unspecified: Secondary | ICD-10-CM | POA: Diagnosis not present

## 2021-05-04 DIAGNOSIS — D500 Iron deficiency anemia secondary to blood loss (chronic): Secondary | ICD-10-CM

## 2021-05-04 LAB — CBC WITH DIFFERENTIAL (CANCER CENTER ONLY)
Abs Immature Granulocytes: 0.01 10*3/uL (ref 0.00–0.07)
Basophils Absolute: 0.1 10*3/uL (ref 0.0–0.1)
Basophils Relative: 1 %
Eosinophils Absolute: 0.1 10*3/uL (ref 0.0–0.5)
Eosinophils Relative: 2 %
HCT: 40.5 % (ref 36.0–46.0)
Hemoglobin: 12.9 g/dL (ref 12.0–15.0)
Immature Granulocytes: 0 %
Lymphocytes Relative: 21 %
Lymphs Abs: 1.7 10*3/uL (ref 0.7–4.0)
MCH: 27.6 pg (ref 26.0–34.0)
MCHC: 31.9 g/dL (ref 30.0–36.0)
MCV: 86.7 fL (ref 80.0–100.0)
Monocytes Absolute: 0.8 10*3/uL (ref 0.1–1.0)
Monocytes Relative: 10 %
Neutro Abs: 5.3 10*3/uL (ref 1.7–7.7)
Neutrophils Relative %: 66 %
Platelet Count: 301 10*3/uL (ref 150–400)
RBC: 4.67 MIL/uL (ref 3.87–5.11)
RDW: 14.5 % (ref 11.5–15.5)
WBC Count: 8 10*3/uL (ref 4.0–10.5)
nRBC: 0 % (ref 0.0–0.2)

## 2021-05-04 LAB — IRON AND TIBC
Iron: 87 ug/dL (ref 41–142)
Saturation Ratios: 21 % (ref 21–57)
TIBC: 420 ug/dL (ref 236–444)
UIBC: 333 ug/dL (ref 120–384)

## 2021-05-04 LAB — FERRITIN: Ferritin: 11 ng/mL (ref 11–307)

## 2021-05-05 ENCOUNTER — Telehealth: Payer: Self-pay | Admitting: *Deleted

## 2021-05-05 NOTE — Telephone Encounter (Signed)
Per scheduling message 05/05/21 - called patient and gave upcoming appointment for (1) dose of IV Iron (venofer)

## 2021-05-08 ENCOUNTER — Other Ambulatory Visit: Payer: Self-pay

## 2021-05-08 ENCOUNTER — Inpatient Hospital Stay: Payer: No Typology Code available for payment source

## 2021-05-08 VITALS — BP 121/67 | HR 65 | Temp 98.8°F | Resp 17

## 2021-05-08 DIAGNOSIS — D508 Other iron deficiency anemias: Secondary | ICD-10-CM

## 2021-05-08 MED ORDER — SODIUM CHLORIDE 0.9 % IV SOLN
Freq: Once | INTRAVENOUS | Status: AC
Start: 2021-05-08 — End: 2021-05-08
  Filled 2021-05-08: qty 250

## 2021-05-08 MED ORDER — IRON SUCROSE 20 MG/ML IV SOLN
200.0000 mg | Freq: Once | INTRAVENOUS | Status: AC
  Administered 2021-05-08: 200 mg via INTRAVENOUS
  Filled 2021-05-08: qty 200

## 2021-05-08 NOTE — Patient Instructions (Signed)

## 2021-05-09 ENCOUNTER — Telehealth: Payer: Self-pay

## 2021-05-11 ENCOUNTER — Inpatient Hospital Stay: Payer: No Typology Code available for payment source

## 2021-05-12 ENCOUNTER — Other Ambulatory Visit: Payer: Self-pay

## 2021-05-12 ENCOUNTER — Inpatient Hospital Stay: Payer: No Typology Code available for payment source | Attending: Hematology & Oncology

## 2021-05-12 VITALS — BP 122/64 | HR 62 | Temp 99.1°F | Resp 17

## 2021-05-12 DIAGNOSIS — D508 Other iron deficiency anemias: Secondary | ICD-10-CM | POA: Diagnosis present

## 2021-05-12 DIAGNOSIS — Z79899 Other long term (current) drug therapy: Secondary | ICD-10-CM | POA: Diagnosis not present

## 2021-05-12 MED ORDER — SODIUM CHLORIDE 0.9 % IV SOLN
Freq: Once | INTRAVENOUS | Status: AC
  Filled 2021-05-12: qty 250

## 2021-05-12 MED ORDER — SODIUM CHLORIDE 0.9 % IV SOLN
200.0000 mg | Freq: Once | INTRAVENOUS | Status: AC
  Administered 2021-05-12: 200 mg via INTRAVENOUS
  Filled 2021-05-12: qty 200

## 2021-05-12 NOTE — Patient Instructions (Signed)

## 2021-05-18 ENCOUNTER — Inpatient Hospital Stay: Payer: No Typology Code available for payment source

## 2021-05-18 ENCOUNTER — Other Ambulatory Visit: Payer: Self-pay

## 2021-05-18 VITALS — BP 95/70 | HR 58 | Resp 19

## 2021-05-18 DIAGNOSIS — D508 Other iron deficiency anemias: Secondary | ICD-10-CM

## 2021-05-18 MED ORDER — SODIUM CHLORIDE 0.9 % IV SOLN
Freq: Once | INTRAVENOUS | Status: AC
Start: 2021-05-18 — End: 2021-05-18
  Filled 2021-05-18: qty 250

## 2021-05-18 MED ORDER — SODIUM CHLORIDE 0.9% FLUSH
3.0000 mL | Freq: Once | INTRAVENOUS | Status: DC | PRN
  Filled 2021-05-18: qty 10

## 2021-05-18 MED ORDER — SODIUM CHLORIDE 0.9 % IV SOLN
200.0000 mg | Freq: Once | INTRAVENOUS | Status: AC
  Administered 2021-05-18: 200 mg via INTRAVENOUS
  Filled 2021-05-18: qty 200

## 2021-05-18 MED ORDER — SODIUM CHLORIDE 0.9% FLUSH
10.0000 mL | Freq: Once | INTRAVENOUS | Status: DC | PRN
  Filled 2021-05-18: qty 10

## 2021-05-18 NOTE — Patient Instructions (Signed)

## 2021-05-30 IMAGING — MG MM DIGITAL SCREENING BILAT W/ TOMO AND CAD
6 of 10 series · 6 of 30 positions shown · non-contrast
Comparison: Previous exam(s).

CLINICAL DATA: Screening.

EXAM:
DIGITAL SCREENING BILATERAL MAMMOGRAM WITH TOMO AND CAD

[R MLO synth-2D]
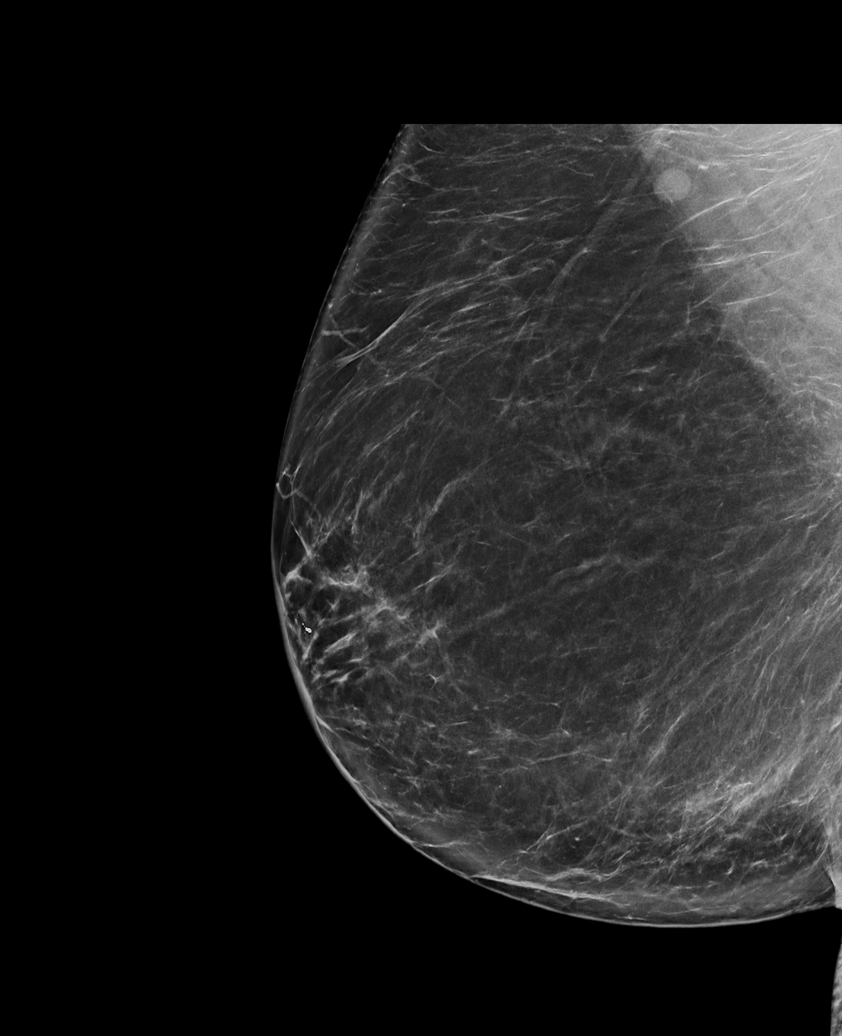

[L MLO synth-2D]
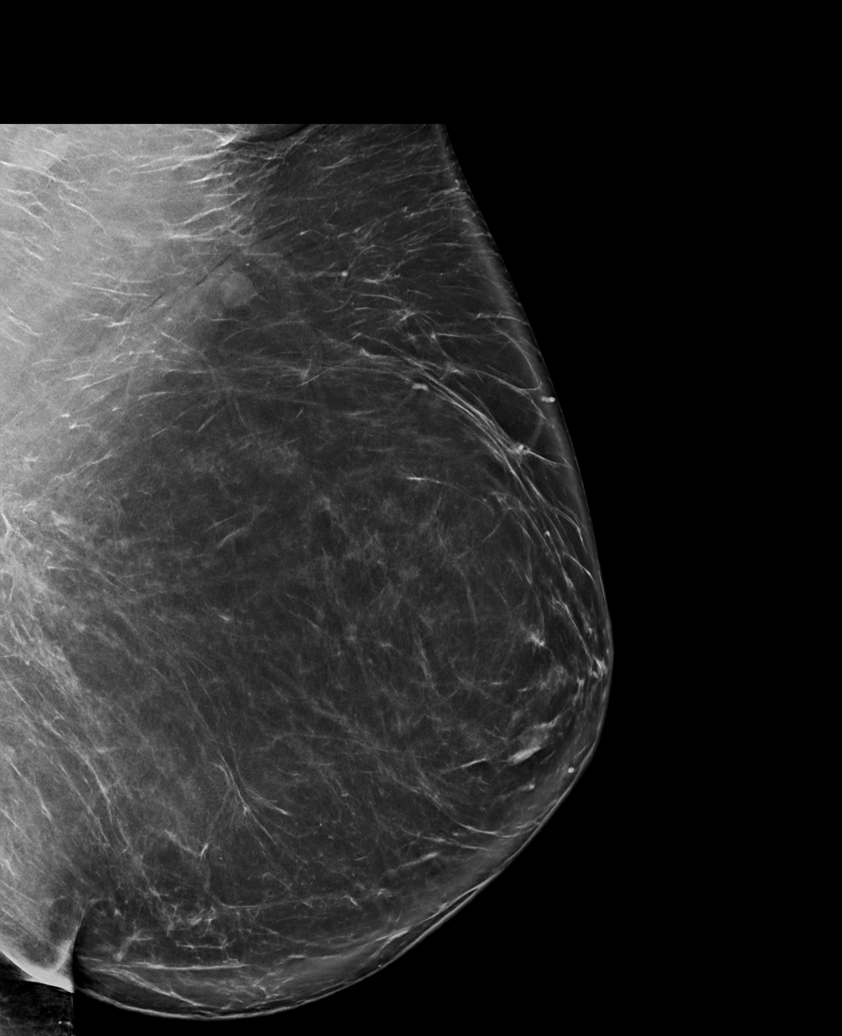

[R CC synth-2D]
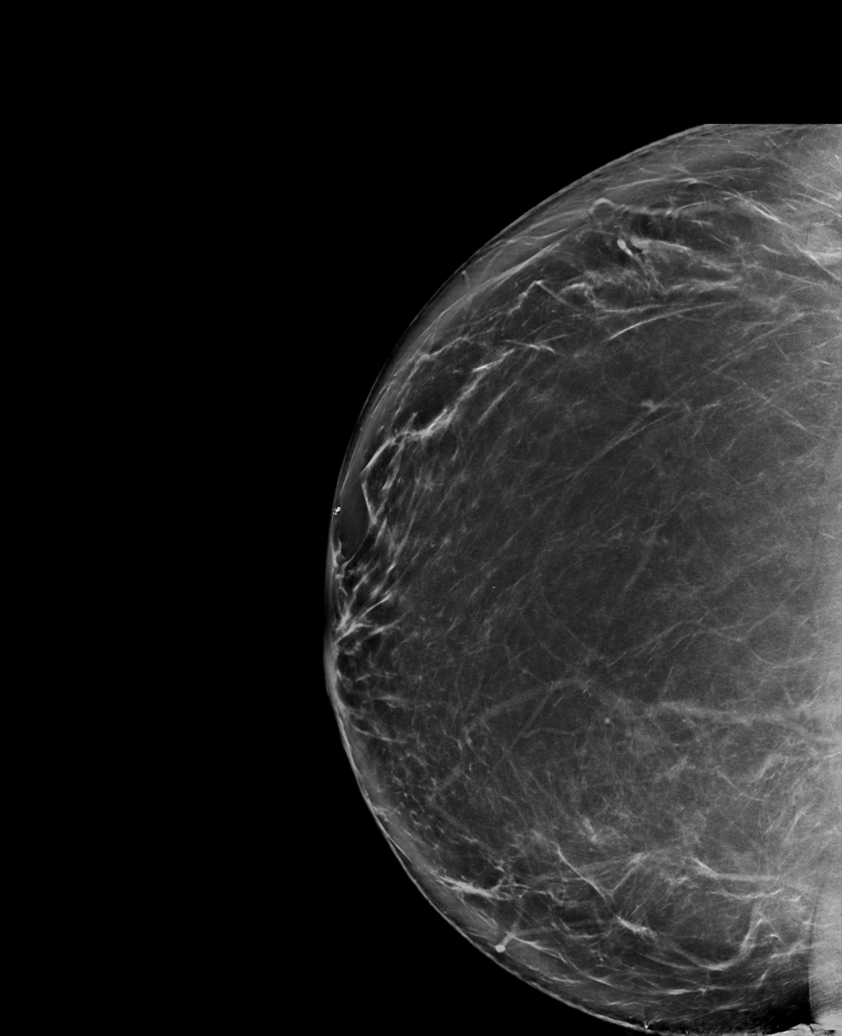

[R CV synth-2D]
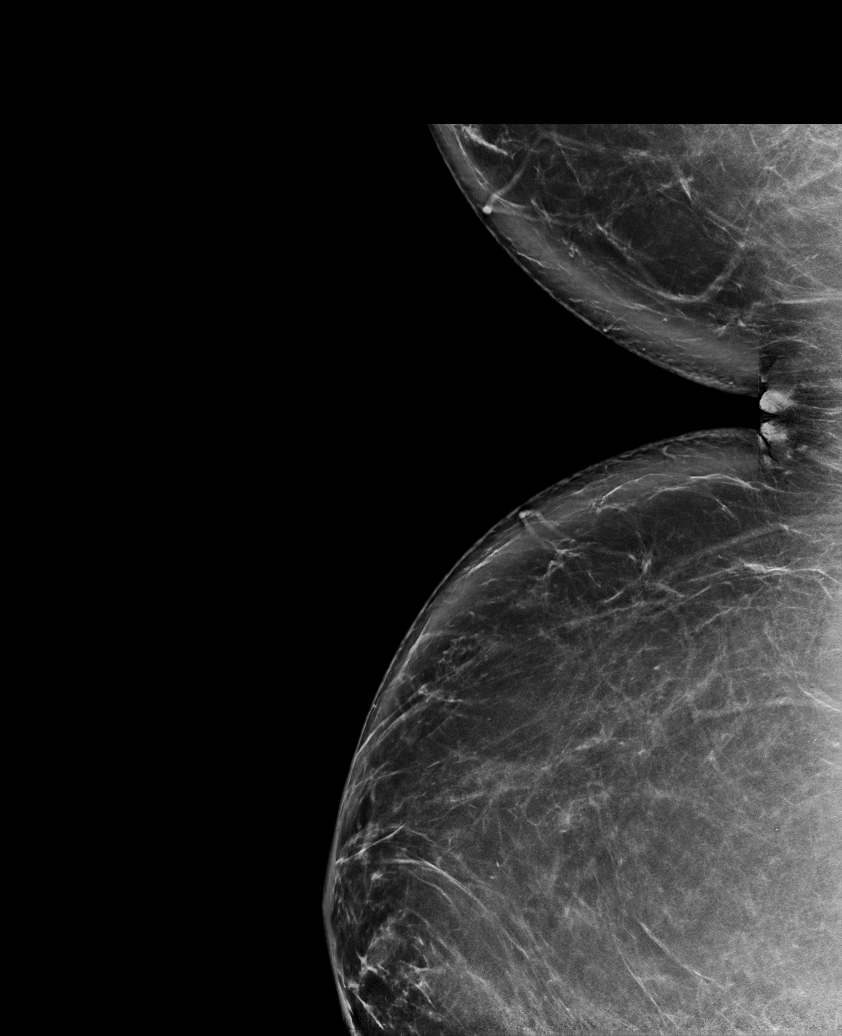

[L CC synth-2D]
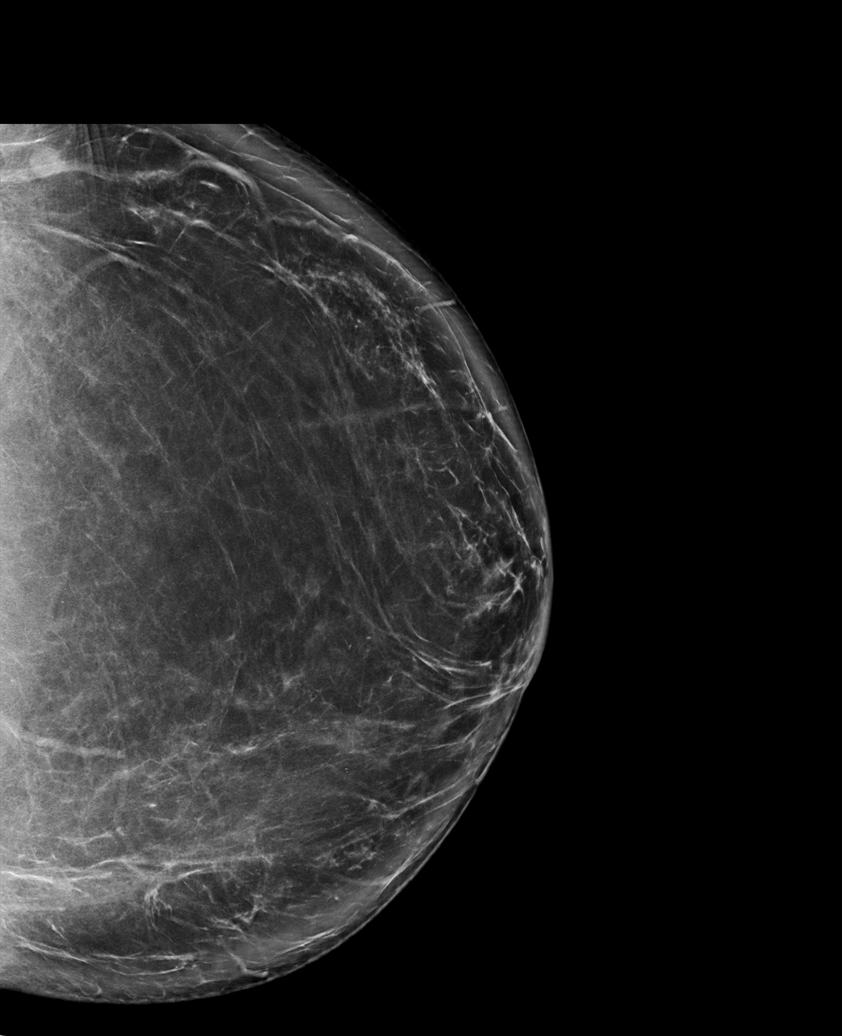

[R CV tomo · tomo slice 51/102.0]
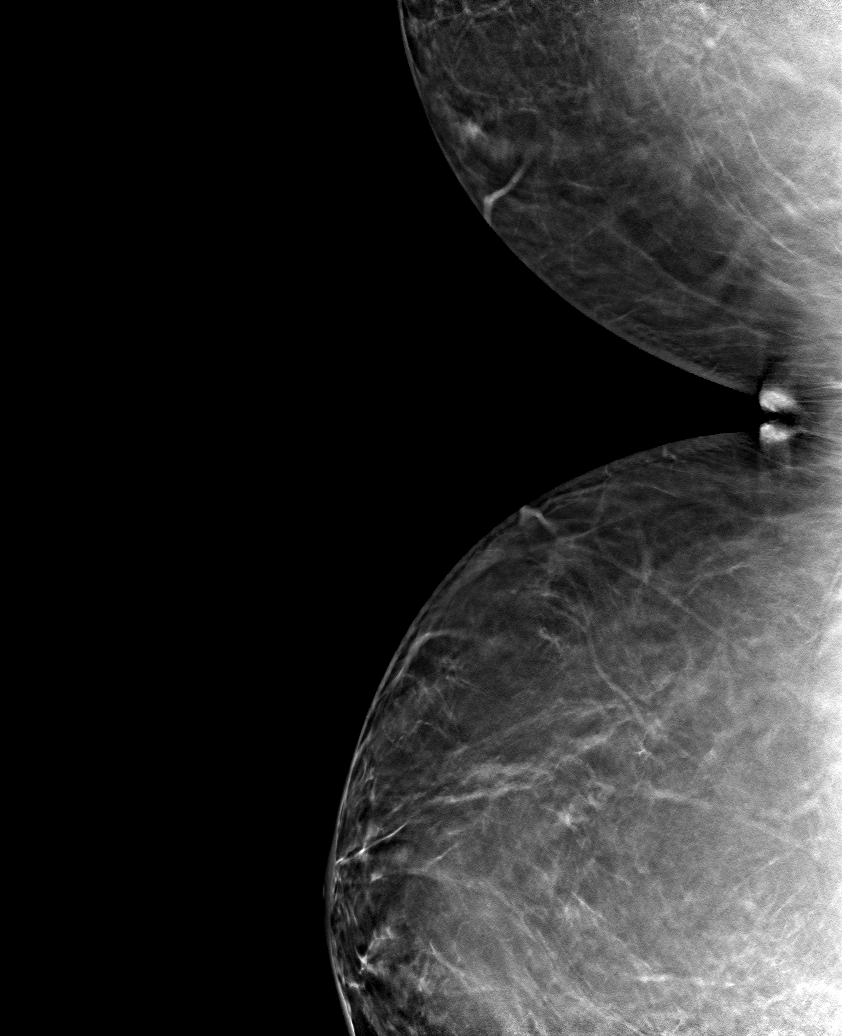

[6 of 30 positions shown; findings below may reference images not displayed]

ACR Breast Density Category b: There are scattered areas of
fibroglandular density.
FINDINGS: There are no findings suspicious for malignancy. The images were
evaluated with computer-aided detection.
IMPRESSION: No mammographic evidence of malignancy. A result letter of this
screening mammogram will be mailed directly to the patient.

RECOMMENDATION:
Screening mammogram in one year. (Code:ZP-7-VX7)

BI-RADS CATEGORY  1: Negative.

## 2021-08-08 ENCOUNTER — Encounter (HOSPITAL_BASED_OUTPATIENT_CLINIC_OR_DEPARTMENT_OTHER): Payer: Self-pay

## 2021-09-18 ENCOUNTER — Inpatient Hospital Stay: Payer: No Typology Code available for payment source | Admitting: Family

## 2021-09-18 ENCOUNTER — Encounter: Payer: Self-pay | Admitting: Family

## 2021-09-18 ENCOUNTER — Inpatient Hospital Stay: Payer: No Typology Code available for payment source | Attending: Hematology & Oncology

## 2021-09-18 DIAGNOSIS — E538 Deficiency of other specified B group vitamins: Secondary | ICD-10-CM | POA: Insufficient documentation

## 2021-09-18 DIAGNOSIS — R42 Dizziness and giddiness: Secondary | ICD-10-CM | POA: Insufficient documentation

## 2021-09-18 DIAGNOSIS — R002 Palpitations: Secondary | ICD-10-CM | POA: Insufficient documentation

## 2021-09-18 DIAGNOSIS — Z886 Allergy status to analgesic agent status: Secondary | ICD-10-CM | POA: Insufficient documentation

## 2021-09-18 DIAGNOSIS — K909 Intestinal malabsorption, unspecified: Secondary | ICD-10-CM | POA: Insufficient documentation

## 2021-09-18 DIAGNOSIS — R5383 Other fatigue: Secondary | ICD-10-CM | POA: Insufficient documentation

## 2021-09-18 DIAGNOSIS — E559 Vitamin D deficiency, unspecified: Secondary | ICD-10-CM | POA: Insufficient documentation

## 2021-09-18 DIAGNOSIS — R202 Paresthesia of skin: Secondary | ICD-10-CM | POA: Insufficient documentation

## 2021-09-18 DIAGNOSIS — Z79899 Other long term (current) drug therapy: Secondary | ICD-10-CM | POA: Insufficient documentation

## 2021-09-18 DIAGNOSIS — R0602 Shortness of breath: Secondary | ICD-10-CM | POA: Insufficient documentation

## 2021-09-18 DIAGNOSIS — Z9884 Bariatric surgery status: Secondary | ICD-10-CM | POA: Insufficient documentation

## 2021-09-18 DIAGNOSIS — D508 Other iron deficiency anemias: Secondary | ICD-10-CM | POA: Insufficient documentation

## 2021-09-19 ENCOUNTER — Telehealth: Payer: Self-pay | Admitting: *Deleted

## 2021-09-19 NOTE — Telephone Encounter (Signed)
Per Pt. advice request Roselyn Reef 118/22 - called and rescheduled appointment - confirmed

## 2021-09-25 ENCOUNTER — Inpatient Hospital Stay: Payer: No Typology Code available for payment source

## 2021-09-25 ENCOUNTER — Other Ambulatory Visit: Payer: Self-pay

## 2021-09-25 ENCOUNTER — Inpatient Hospital Stay (HOSPITAL_BASED_OUTPATIENT_CLINIC_OR_DEPARTMENT_OTHER): Payer: No Typology Code available for payment source | Admitting: Family

## 2021-09-25 ENCOUNTER — Encounter: Payer: Self-pay | Admitting: Family

## 2021-09-25 DIAGNOSIS — R002 Palpitations: Secondary | ICD-10-CM | POA: Diagnosis not present

## 2021-09-25 DIAGNOSIS — R42 Dizziness and giddiness: Secondary | ICD-10-CM | POA: Diagnosis not present

## 2021-09-25 DIAGNOSIS — E559 Vitamin D deficiency, unspecified: Secondary | ICD-10-CM

## 2021-09-25 DIAGNOSIS — D510 Vitamin B12 deficiency anemia due to intrinsic factor deficiency: Secondary | ICD-10-CM

## 2021-09-25 DIAGNOSIS — Z79899 Other long term (current) drug therapy: Secondary | ICD-10-CM | POA: Diagnosis not present

## 2021-09-25 DIAGNOSIS — R0602 Shortness of breath: Secondary | ICD-10-CM | POA: Diagnosis not present

## 2021-09-25 DIAGNOSIS — E538 Deficiency of other specified B group vitamins: Secondary | ICD-10-CM | POA: Diagnosis not present

## 2021-09-25 DIAGNOSIS — Z886 Allergy status to analgesic agent status: Secondary | ICD-10-CM | POA: Diagnosis not present

## 2021-09-25 DIAGNOSIS — R5383 Other fatigue: Secondary | ICD-10-CM | POA: Diagnosis not present

## 2021-09-25 DIAGNOSIS — R202 Paresthesia of skin: Secondary | ICD-10-CM | POA: Diagnosis not present

## 2021-09-25 DIAGNOSIS — D508 Other iron deficiency anemias: Secondary | ICD-10-CM

## 2021-09-25 DIAGNOSIS — K909 Intestinal malabsorption, unspecified: Secondary | ICD-10-CM | POA: Diagnosis not present

## 2021-09-25 DIAGNOSIS — Z9884 Bariatric surgery status: Secondary | ICD-10-CM | POA: Diagnosis not present

## 2021-09-25 LAB — RETICULOCYTES
Immature Retic Fract: 6.1 % (ref 2.3–15.9)
RBC.: 4.88 MIL/uL (ref 3.87–5.11)
Retic Count, Absolute: 58.6 10*3/uL (ref 19.0–186.0)
Retic Ct Pct: 1.2 % (ref 0.4–3.1)

## 2021-09-25 LAB — CBC WITH DIFFERENTIAL (CANCER CENTER ONLY)
Abs Immature Granulocytes: 0.02 10*3/uL (ref 0.00–0.07)
Basophils Absolute: 0.1 10*3/uL (ref 0.0–0.1)
Basophils Relative: 1 %
Eosinophils Absolute: 0.1 10*3/uL (ref 0.0–0.5)
Eosinophils Relative: 2 %
HCT: 43.2 % (ref 36.0–46.0)
Hemoglobin: 13.6 g/dL (ref 12.0–15.0)
Immature Granulocytes: 0 %
Lymphocytes Relative: 37 %
Lymphs Abs: 2.7 10*3/uL (ref 0.7–4.0)
MCH: 28.2 pg (ref 26.0–34.0)
MCHC: 31.5 g/dL (ref 30.0–36.0)
MCV: 89.4 fL (ref 80.0–100.0)
Monocytes Absolute: 0.7 10*3/uL (ref 0.1–1.0)
Monocytes Relative: 10 %
Neutro Abs: 3.7 10*3/uL (ref 1.7–7.7)
Neutrophils Relative %: 50 %
Platelet Count: 281 10*3/uL (ref 150–400)
RBC: 4.83 MIL/uL (ref 3.87–5.11)
RDW: 14.1 % (ref 11.5–15.5)
WBC Count: 7.3 10*3/uL (ref 4.0–10.5)
nRBC: 0 % (ref 0.0–0.2)

## 2021-09-25 LAB — CMP (CANCER CENTER ONLY)
ALT: 35 U/L (ref 0–44)
AST: 27 U/L (ref 15–41)
Albumin: 4.1 g/dL (ref 3.5–5.0)
Alkaline Phosphatase: 101 U/L (ref 38–126)
Anion gap: 9 (ref 5–15)
BUN: 14 mg/dL (ref 6–20)
CO2: 26 mmol/L (ref 22–32)
Calcium: 9.9 mg/dL (ref 8.9–10.3)
Chloride: 106 mmol/L (ref 98–111)
Creatinine: 0.87 mg/dL (ref 0.44–1.00)
GFR, Estimated: >60 mL/min (ref >60)
Glucose, Bld: 97 mg/dL (ref 70–99)
Potassium: 3.8 mmol/L (ref 3.5–5.1)
Sodium: 141 mmol/L (ref 135–145)
Total Bilirubin: 0.4 mg/dL (ref 0.3–1.2)
Total Protein: 7.4 g/dL (ref 6.5–8.1)

## 2021-09-25 LAB — VITAMIN B12: Vitamin B-12: 293 pg/mL (ref 180–914)

## 2021-09-25 LAB — VITAMIN D 25 HYDROXY (VIT D DEFICIENCY, FRACTURES): Vit D, 25-Hydroxy: 34.79 ng/mL (ref 30–100)

## 2021-09-25 MED ORDER — CYANOCOBALAMIN 1000 MCG/ML IJ SOLN: INTRAMUSCULAR | 2 refills | Status: DC

## 2021-09-25 NOTE — Progress Notes (Signed)
Hematology and Oncology Follow Up Visit  Sheri [name] [id number] 22-Mar-1976 45 y.o. 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 She is symptomatic with fatigue, dizziness, mild SOB with exertion and occasional palpitations.  She notes occasional bleeding around her gums when she brushes her teeth.  No other blood loss noted.  No bruising or petechiae.  Platelets 281, WBC count 7.3, Hgb 13.6 and MCV 89.  No fever, chills, n/v, cough, rash, chest pain, abdominal pain or changes in bowel or bladder habits.  No swelling, tenderness, numbness or tingling in her extremities at this time. She has occasional positional tingling in her fingers.  No falls or syncope to report.  She has maintained a good appetite and is staying well hydrated. Her weight is stable at 269 lbs.   ECOG Performance Status: 1 - Symptomatic but completely ambulatory  Medications:  Allergies as of 09/25/2021       Reactions   Aspirin Other (See Comments)   Pt. Has had gastric bypass surgery.  ASA causes ulcers.         Medication List        Accurate as of September 25, 2021  2:56 PM. If you have any questions, ask your nurse or doctor.          cyanocobalamin 1000 MCG/ML injection Commonly known as: (VITAMIN B-12) INJECT 1 ML (1,000 MCG TOTAL) INTO THE MUSCLE EVERY 30 DAYS.   montelukast 10 MG tablet Commonly known as: SINGULAIR Take 1 tablet (10 mg total) by mouth at bedtime.   Norethindrone-Ethinyl Estradiol-Fe 0.8-25 MG-MCG tablet Commonly known as: Kaitlib Fe CHEW 1 TABLET DAILY. (TAKESCONTINUOUS ACTIVE ORAL     CONTRACEPTIVE PILLS.)   Vitamin D (Ergocalciferol) 1.25 MG (50000 UNIT) Caps capsule Commonly known as: DRISDOL TAKE 1 CAPSULE BY  MOUTH ONE TIME PER WEEK        Allergies:  Allergies  Allergen Reactions   Aspirin Other (See Comments)    Pt. Has had gastric bypass surgery.  ASA causes ulcers.     Past Medical History, Surgical history, Social history, and Family History were reviewed and updated.  Review of Systems: All other 10 point review of systems is negative.   Physical Exam:  height is 5\' 5"  (1.651 m) and weight is 269 lb 1.9 oz (122.1 kg). Her oral temperature is 98.6 F (37 C). Her blood pressure is 125/67 and her pulse is 59 (abnormal). Her respiration is 18 and oxygen saturation is 100%.   Wt Readings from Last 3 Encounters:  09/25/21 269 lb 1.9 oz (122.1 kg)  03/17/21 277 lb 12.8 oz (126 kg)  02/06/21 265 lb (120.2 kg)    Ocular: Sclerae unicteric, pupils equal, round and reactive to light Ear-nose-throat: Oropharynx clear, dentition fair Lymphatic: No cervical or supraclavicular adenopathy Lungs no rales or rhonchi, good excursion bilaterally Heart regular rate and rhythm, no murmur appreciated Abd soft, nontender, positive bowel sounds MSK no focal spinal tenderness, no joint edema Neuro: non-focal, well-oriented, appropriate affect Breasts: Deferred   Lab Results  Component Value Date   WBC 7.3 09/25/2021   HGB 13.6 09/25/2021   HCT 43.2 09/25/2021   MCV 89.4 09/25/2021   PLT 281 09/25/2021  Lab Results  Component Value Date   FERRITIN 11 05/04/2021   IRON 87 05/04/2021   TIBC 420 05/04/2021   UIBC 333 05/04/2021   IRONPCTSAT 21 05/04/2021   Lab Results  Component Value Date   RETICCTPCT 1.2 09/25/2021   RBC 4.83 09/25/2021   RBC 4.88 09/25/2021   No results found for: KPAFRELGTCHN, LAMBDASER, KAPLAMBRATIO No results found for: IGGSERUM, IGA, IGMSERUM No results found for: Kathrynn Ducking, MSPIKE, SPEI   Chemistry      Component Value Date/Time   NA 138 03/17/2021 0816   NA 144 10/08/2017 0812   NA 140 07/10/2017 0838    K 3.9 03/17/2021 0816   K 3.9 10/08/2017 0812   K 3.6 07/10/2017 0838   CL 107 03/17/2021 0816   CL 109 (H) 10/08/2017 0812   CO2 25 03/17/2021 0816   CO2 25 10/08/2017 0812   CO2 23 07/10/2017 0838   BUN 13 03/17/2021 0816   BUN 9 10/08/2017 0812   BUN 9.0 07/10/2017 0838   CREATININE 0.80 03/17/2021 0816   CREATININE 0.8 10/08/2017 0812   CREATININE 0.8 07/10/2017 0838      Component Value Date/Time   CALCIUM 9.2 03/17/2021 0816   CALCIUM 9.1 10/08/2017 0812   CALCIUM 9.2 07/10/2017 0838   ALKPHOS 84 03/17/2021 0816   ALKPHOS 78 10/08/2017 0812   ALKPHOS 127 07/10/2017 0838   AST 19 03/17/2021 0816   AST 22 07/10/2017 0838   ALT 21 03/17/2021 0816   ALT 23 10/08/2017 0812   ALT 22 07/10/2017 0838   BILITOT 0.4 03/17/2021 0816   BILITOT 0.34 07/10/2017 0838       Impression and Plan: Sheri [name] is a very pleasant 45 yo African American female with iron, B-12 and Vit D deficiency 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  B 12 refilled.  Iron studies are pending. We will replace if needed.  Follow-up in 6 months.  She can contact our office with any questions or concerns.   Lottie Dawson, NP 11/14/20222:56 PM

## 2021-09-26 ENCOUNTER — Telehealth: Payer: Self-pay | Admitting: *Deleted

## 2021-09-26 LAB — IRON AND TIBC
Iron: 143 ug/dL — ABNORMAL HIGH (ref 41–142)
Saturation Ratios: 41 % (ref 21–57)
TIBC: 348 ug/dL (ref 236–444)
UIBC: 205 ug/dL (ref 120–384)

## 2021-09-26 LAB — FERRITIN: Ferritin: 71 ng/mL (ref 11–307)

## 2021-09-26 NOTE — Telephone Encounter (Signed)
Per 11/11//22 los called and gave upcoming appointments - confirmed

## 2021-10-11 ENCOUNTER — Ambulatory Visit (HOSPITAL_BASED_OUTPATIENT_CLINIC_OR_DEPARTMENT_OTHER): Payer: No Typology Code available for payment source | Admitting: Obstetrics & Gynecology

## 2021-10-16 ENCOUNTER — Ambulatory Visit (INDEPENDENT_AMBULATORY_CARE_PROVIDER_SITE_OTHER): Payer: No Typology Code available for payment source | Admitting: Obstetrics & Gynecology

## 2021-10-16 ENCOUNTER — Encounter (HOSPITAL_BASED_OUTPATIENT_CLINIC_OR_DEPARTMENT_OTHER): Payer: Self-pay | Admitting: Obstetrics & Gynecology

## 2021-10-16 ENCOUNTER — Other Ambulatory Visit: Payer: Self-pay

## 2021-10-16 ENCOUNTER — Other Ambulatory Visit (HOSPITAL_COMMUNITY)
Admission: RE | Admit: 2021-10-16 | Discharge: 2021-10-16 | Disposition: A | Discharge status: Home / Self Care | Payer: No Typology Code available for payment source | Source: Ambulatory Visit | Attending: Obstetrics & Gynecology | Admitting: Obstetrics & Gynecology

## 2021-10-16 VITALS — BP 120/60 | HR 58 | Ht 65.0 in | Wt 263.2 lb

## 2021-10-16 DIAGNOSIS — Z9884 Bariatric surgery status: Secondary | ICD-10-CM

## 2021-10-16 DIAGNOSIS — Z01419 Encounter for gynecological examination (general) (routine) without abnormal findings: Secondary | ICD-10-CM

## 2021-10-16 DIAGNOSIS — N898 Other specified noninflammatory disorders of vagina: Secondary | ICD-10-CM | POA: Diagnosis not present

## 2021-10-16 DIAGNOSIS — Z23 Encounter for immunization: Secondary | ICD-10-CM

## 2021-10-16 DIAGNOSIS — F341 Dysthymic disorder: Secondary | ICD-10-CM

## 2021-10-16 MED ORDER — ESCITALOPRAM OXALATE 10 MG PO TABS: 10.0000 mg | ORAL_TABLET | Freq: Every day | ORAL | 1 refills | Status: DC

## 2021-10-16 MED ORDER — FLUCONAZOLE 150 MG PO TABS: 150.0000 mg | ORAL_TABLET | Freq: Once | ORAL | 0 refills | Status: AC

## 2021-10-16 MED ORDER — NORETHIN-ETH ESTRADIOL-FE 0.8-25 MG-MCG PO CHEW: CHEWABLE_TABLET | ORAL | 4 refills | Status: DC

## 2021-10-16 NOTE — Progress Notes (Addendum)
45 y.o. G2P0 Single Black or [place] American female here for annual exam.  Had viral URI last week.  Needs a flu shot.  Takes continuous active OCPs.    Had iron infusion in July.  She is followed by [facility].    Having a little depressed mood.  Tried sertraline with Dr. Melford Aase in the past.  She had some weight gain with this.   No LMP recorded. (Menstrual status: Oral contraceptives).          Sexually active: No.  The current method of family planning is OCP (estrogen/progesterone).    Exercising: trying Smoker:  no  Health Maintenance: Pap:  11/02/2020 Negative History of abnormal Pap:  no MMG:  12/13/2020 Negative Colonoscopy:  02/06/2021, follow up 7 years BMD:   not indicated Screening Labs: done last December   reports that she has never smoked. She has never used smokeless tobacco. She reports that she does not drink alcohol and does not use drugs.  Past Medical History:  Diagnosis Date   Allergy    seasonal allergies   Anemia    and low B12-on meds   GERD (gastroesophageal reflux disease)    with certain foods/due to gastric bypass   Heart murmur    as child - no problems as adult   History of blood transfusion 2004   [facility], after surgery   Hypothyroidism    history=resolved   Inflammation of joint of knee    right knee-on anti-inflammatory, arthritis   Seasonal allergies    Vitamin B 12 deficiency     Past Surgical History:  Procedure Laterality Date   ABDOMINOPLASTY  2004   BREAST REDUCTION SURGERY  2005   GASTRIC BYPASS  2001   Dr Duwayne Heck, in [place] Right 05/17/2014   Procedure: LAPAROSCOPIC  partial  right salpingo-oohorectomy  and cystoscopy;  Surgeon: Lyman Speller, MD;  Location: [facility];  Service: Gynecology;  Laterality: Right;   REDUCTION MAMMAPLASTY Bilateral 2004   WISDOM TOOTH EXTRACTION  2014   upper right    Current Outpatient Medications  Medication Sig Dispense  Refill   cyanocobalamin (,VITAMIN B-12,) 1000 MCG/ML injection INJECT 1 ML (1,000 MCG TOTAL) INTO THE MUSCLE EVERY 30 DAYS. 10 mL 2   montelukast (SINGULAIR) 10 MG tablet Take 1 tablet (10 mg total) by mouth at bedtime. 90 tablet 4   Norethindrone-Ethinyl Estradiol-Fe (KAITLIB FE) 0.8-25 MG-MCG tablet CHEW 1 TABLET DAILY. (TAKESCONTINUOUS ACTIVE ORAL     CONTRACEPTIVE PILLS.) 112 tablet 4   Vitamin D, Ergocalciferol, (DRISDOL) 1.25 MG (50000 UNIT) CAPS capsule TAKE 1 CAPSULE BY MOUTH ONE TIME PER WEEK 12 capsule 4   No current facility-administered medications for this visit.    Family History  Problem Relation Age of Onset   CVA Maternal Grandmother    Hypertension Brother    Diabetes Brother    Heart disease Brother    Diabetes Sister    Hypertension Sister    Breast cancer Sister 50   Diabetes Sister    Hypertension Sister    Diabetes Brother    Hypertension Brother    Heart disease Brother    Diabetes Brother    Hypertension Brother    Heart disease Brother    Colon polyps Neg Hx    Colon cancer Neg Hx    Esophageal cancer Neg Hx    Rectal cancer Neg Hx    Stomach cancer Neg Hx     Review  of Systems  All other systems reviewed and are negative.  Exam:   BP 120/60 (BP Location: Left Arm, Patient Position: Sitting, Cuff Size: Large)   Pulse (!) 58   Ht 5\' 5"  (1.651 m)   Wt 263 lb 3.2 oz (119.4 kg)   BMI 43.80 kg/m   Height: 5\' 5"  (165.1 cm)  General appearance: alert, cooperative and appears stated age Head: Normocephalic, without obvious abnormality, atraumatic Neck: no adenopathy, supple, symmetrical, trachea midline and thyroid normal to inspection and palpation Lungs: clear to auscultation bilaterally Breasts: normal appearance, no masses or tenderness Heart: regular rate and rhythm Abdomen: soft, non-tender; bowel sounds normal; no masses,  no organomegaly Extremities: extremities normal, atraumatic, no cyanosis or edema Skin: Skin color, texture, turgor  normal. No rashes or lesions Lymph nodes: Cervical, supraclavicular, and axillary nodes normal. No abnormal inguinal nodes palpated Neurologic: Grossly normal   Pelvic: External genitalia:  no lesions              Urethra:  normal appearing urethra with no masses, tenderness or lesions              Bartholins and Skenes: normal                 Vagina: normal appearing vagina with normal color and no discharge, no lesions              Cervix: no lesions              Pap taken: No. Bimanual Exam:  Uterus:  normal size, contour, position, consistency, mobility, non-tender              Adnexa: normal adnexa and no mass, fullness, tenderness               Rectovaginal: Confirms               Anus:  normal sphincter tone, no lesions  Chaperone, Octaviano Batty, CMA, was present for exam.  Assessment/Plan: 1. Well woman exam with routine gynecological exam - pap with HR HPV 2021 - MMG 2/202 - colonoscopy 01/2021 - plan BMD after age 20 - Flu Vaccine QUAD 36+ mos IM (Fluarix, Quad PF)  2. Encounter for gynecological examination without abnormal finding - Norethindrone-Ethinyl Estradiol-Fe (KAITLIB FE) 0.8-25 MG-MCG tablet; CHEW 1 TABLET DAILY. (TAKESCONTINUOUS ACTIVE ORAL CONTRACEPTIVE PILLS.)  Dispense: 112 tablet; Refill: 4  3. History of Roux-en-Y gastric bypass  4. Dysthymia - escitalopram (LEXAPRO) 10 MG tablet; Take 1 tablet (10 mg total) by mouth daily.  Dispense: 30 tablet; Refill: 1 - follow up 3-4 weeks  5.  Vaginal discharge - ancillary testing obtained - diflucan rx to pharmacy

## 2021-10-17 LAB — CERVICOVAGINAL ANCILLARY ONLY
Bacterial Vaginitis (gardnerella): NEGATIVE
Candida Glabrata: NEGATIVE
Candida Vaginitis: NEGATIVE
Comment: NEGATIVE
Comment: NEGATIVE
Comment: NEGATIVE

## 2021-11-12 HISTORY — PX: COLONOSCOPY: SHX174

## 2021-11-15 ENCOUNTER — Encounter (HOSPITAL_BASED_OUTPATIENT_CLINIC_OR_DEPARTMENT_OTHER): Payer: Self-pay | Admitting: Obstetrics & Gynecology

## 2021-11-15 ENCOUNTER — Encounter (HOSPITAL_BASED_OUTPATIENT_CLINIC_OR_DEPARTMENT_OTHER): Payer: Self-pay | Admitting: *Deleted

## 2021-11-15 ENCOUNTER — Other Ambulatory Visit (HOSPITAL_BASED_OUTPATIENT_CLINIC_OR_DEPARTMENT_OTHER): Payer: Self-pay | Admitting: *Deleted

## 2021-11-15 DIAGNOSIS — F341 Dysthymic disorder: Secondary | ICD-10-CM

## 2021-11-15 MED ORDER — ESCITALOPRAM OXALATE 10 MG PO TABS: 10.0000 mg | ORAL_TABLET | Freq: Every day | ORAL | 1 refills | Status: DC

## 2021-12-06 ENCOUNTER — Other Ambulatory Visit: Payer: Self-pay

## 2021-12-06 DIAGNOSIS — F341 Dysthymic disorder: Secondary | ICD-10-CM

## 2021-12-06 NOTE — Telephone Encounter (Signed)
Patient of Dr. Hale Bogus. AEX 10/16/21.  Refill denied since not a patient of our practice. Fax # provided to pharmacy.

## 2021-12-12 ENCOUNTER — Other Ambulatory Visit (HOSPITAL_BASED_OUTPATIENT_CLINIC_OR_DEPARTMENT_OTHER): Payer: Self-pay | Admitting: Obstetrics & Gynecology

## 2021-12-12 ENCOUNTER — Encounter (HOSPITAL_BASED_OUTPATIENT_CLINIC_OR_DEPARTMENT_OTHER): Payer: Self-pay | Admitting: Obstetrics & Gynecology

## 2021-12-13 ENCOUNTER — Other Ambulatory Visit (HOSPITAL_BASED_OUTPATIENT_CLINIC_OR_DEPARTMENT_OTHER): Payer: Self-pay | Admitting: Obstetrics & Gynecology

## 2021-12-13 DIAGNOSIS — F341 Dysthymic disorder: Secondary | ICD-10-CM

## 2021-12-13 MED ORDER — ESCITALOPRAM OXALATE 10 MG PO TABS: 10.0000 mg | ORAL_TABLET | Freq: Every day | ORAL | 3 refills | Status: DC

## 2022-01-18 ENCOUNTER — Ambulatory Visit (INDEPENDENT_AMBULATORY_CARE_PROVIDER_SITE_OTHER): Payer: No Typology Code available for payment source | Admitting: Obstetrics & Gynecology

## 2022-01-18 ENCOUNTER — Other Ambulatory Visit: Payer: Self-pay

## 2022-01-18 ENCOUNTER — Encounter: Payer: Self-pay | Admitting: Family

## 2022-01-18 ENCOUNTER — Encounter (HOSPITAL_BASED_OUTPATIENT_CLINIC_OR_DEPARTMENT_OTHER): Payer: Self-pay | Admitting: Obstetrics & Gynecology

## 2022-01-18 DIAGNOSIS — D510 Vitamin B12 deficiency anemia due to intrinsic factor deficiency: Secondary | ICD-10-CM

## 2022-01-18 DIAGNOSIS — E559 Vitamin D deficiency, unspecified: Secondary | ICD-10-CM

## 2022-01-18 DIAGNOSIS — F341 Dysthymic disorder: Secondary | ICD-10-CM

## 2022-01-18 DIAGNOSIS — D508 Other iron deficiency anemias: Secondary | ICD-10-CM

## 2022-01-18 MED ORDER — ESCITALOPRAM OXALATE 10 MG PO TABS: 15.0000 mg | ORAL_TABLET | Freq: Every day | ORAL | 3 refills | Status: DC

## 2022-01-18 NOTE — Progress Notes (Signed)
GYNECOLOGY  VISIT ? ?CC:   med check ? ?HPI: ?46 y.o. G2P0 Single Black or [place] American female here for follow up after starting lexapro '10mg'$ .  Feels this has really helped.  Feels this has helped her appetite as well although she knows this isn't a typical side effects of this.  We discussed when should consider increase dosage.  She feels she may benefit from a small increase. Will increase by '5mg'$ .  Knows if has any issues/concerns with increased dosage to let me know. ? ?Patient Active Problem List  ? Diagnosis Date Noted  ? Pernicious anemia 06/20/2016  ? Iron deficiency anemia 06/20/2016  ? 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 ? ?Past Medical History:  ?Diagnosis Date  ? Allergy   ? seasonal allergies  ? Anemia   ? and low B12-on meds  ? GERD (gastroesophageal reflux disease)   ? with certain foods/due to gastric bypass  ? Heart murmur   ? as child - no problems as adult  ? History of blood transfusion 2004  ? [facility], after surgery  ? Hypothyroidism   ? history=resolved  ? Inflammation of joint of knee   ? right knee-on anti-inflammatory, arthritis  ? Seasonal allergies   ? Vitamin B 12 deficiency   ? ? ?Past Surgical History:  ?Procedure Laterality Date  ? ABDOMINOPLASTY  2004  ? BREAST REDUCTION SURGERY  2005  ? GASTRIC BYPASS  2001  ? Dr Duwayne Heck, in [place]  ? LAPAROSCOPIC OVARIAN CYSTECTOMY Right 05/17/2014  ? Procedure: LAPAROSCOPIC  partial  right salpingo-oohorectomy  and cystoscopy;  Surgeon: Lyman Speller, MD;  Location: [facility];  Service: Gynecology;  Laterality: Right;  ? REDUCTION MAMMAPLASTY Bilateral 2004  ? [place] EXTRACTION  2014  ? upper right  ? ? ?MEDS:   ?Current Outpatient Medications on File Prior to Visit  ?Medication Sig Dispense Refill  ? cyanocobalamin (,VITAMIN B-12,) 1000 MCG/ML injection INJECT 1 ML (1,000 MCG TOTAL) INTO THE MUSCLE EVERY 30 DAYS. 10 mL 2  ? montelukast (SINGULAIR) 10 MG tablet Take 1 tablet (10 mg total) by mouth at  bedtime. 90 tablet 4  ? Norethindrone-Ethinyl Estradiol-Fe (KAITLIB FE) 0.8-25 MG-MCG tablet CHEW 1 TABLET DAILY. (TAKESCONTINUOUS ACTIVE ORAL CONTRACEPTIVE PILLS.) 112 tablet 4  ? Vitamin D, Ergocalciferol, (DRISDOL) 1.25 MG (50000 UNIT) CAPS capsule TAKE 1 CAPSULE BY MOUTH ONE TIME PER WEEK 12 capsule 4  ? ?No current facility-administered medications on file prior to visit.  ? ? ?ALLERGIES: Aspirin ? ?Family History  ?Problem Relation Age of Onset  ? CVA Maternal Grandmother   ? Hypertension Brother   ? Diabetes Brother   ? Heart disease Brother   ? Diabetes Sister   ? Hypertension Sister   ? Breast cancer Sister 77  ? Diabetes Sister   ? Hypertension Sister   ? Diabetes Brother   ? Hypertension Brother   ? Heart disease Brother   ? Diabetes Brother   ? Hypertension Brother   ? Heart disease Brother   ? Colon polyps Neg Hx   ? Colon cancer Neg Hx   ? Esophageal cancer Neg Hx   ? Rectal cancer Neg Hx   ? Stomach cancer Neg Hx   ? ? ?SH:  single, non smoker ? ?Review of Systems  ?Constitutional: Negative.   ? ?PHYSICAL EXAMINATION:   ? ?BP 114/79   Pulse (!) 52   Resp 16     ?Physical Exam ?Constitutional:   ?  Appearance: Normal appearance.  ?Neurological:  ?   General: No focal deficit present.  ?   Mental Status: She is alert.  ?Psychiatric:     ?   Mood and Affect: Mood normal.  ? ?1. Dysthymia ?- escitalopram (LEXAPRO) 10 MG tablet; Take 1.5 tablets (15 mg total) by mouth daily.  Dispense: 135 tablet; Refill: 3  ?

## 2022-01-21 ENCOUNTER — Encounter (HOSPITAL_BASED_OUTPATIENT_CLINIC_OR_DEPARTMENT_OTHER): Payer: Self-pay | Admitting: Obstetrics & Gynecology

## 2022-01-21 DIAGNOSIS — F341 Dysthymic disorder: Secondary | ICD-10-CM | POA: Insufficient documentation

## 2022-03-26 ENCOUNTER — Encounter: Payer: Self-pay | Admitting: Family

## 2022-03-26 ENCOUNTER — Inpatient Hospital Stay (HOSPITAL_BASED_OUTPATIENT_CLINIC_OR_DEPARTMENT_OTHER): Payer: No Typology Code available for payment source | Admitting: Family

## 2022-03-26 ENCOUNTER — Inpatient Hospital Stay: Payer: No Typology Code available for payment source | Attending: Hematology & Oncology

## 2022-03-26 DIAGNOSIS — D508 Other iron deficiency anemias: Secondary | ICD-10-CM | POA: Insufficient documentation

## 2022-03-26 DIAGNOSIS — D510 Vitamin B12 deficiency anemia due to intrinsic factor deficiency: Secondary | ICD-10-CM | POA: Insufficient documentation

## 2022-03-26 DIAGNOSIS — E559 Vitamin D deficiency, unspecified: Secondary | ICD-10-CM

## 2022-03-26 DIAGNOSIS — K912 Postsurgical malabsorption, not elsewhere classified: Secondary | ICD-10-CM | POA: Diagnosis present

## 2022-03-26 DIAGNOSIS — Z9884 Bariatric surgery status: Secondary | ICD-10-CM | POA: Insufficient documentation

## 2022-03-26 LAB — CMP (CANCER CENTER ONLY)
ALT: 22 U/L (ref 0–44)
AST: 20 U/L (ref 15–41)
Albumin: 3.8 g/dL (ref 3.5–5.0)
Alkaline Phosphatase: 74 U/L (ref 38–126)
Anion gap: 7 (ref 5–15)
BUN: 14 mg/dL (ref 6–20)
CO2: 23 mmol/L (ref 22–32)
Calcium: 9.2 mg/dL (ref 8.9–10.3)
Chloride: 106 mmol/L (ref 98–111)
Creatinine: 0.9 mg/dL (ref 0.44–1.00)
GFR, Estimated: >60 mL/min (ref >60)
Glucose, Bld: 87 mg/dL (ref 70–99)
Potassium: 4.1 mmol/L (ref 3.5–5.1)
Sodium: 136 mmol/L (ref 135–145)
Total Bilirubin: 0.3 mg/dL (ref 0.3–1.2)
Total Protein: 6.8 g/dL (ref 6.5–8.1)

## 2022-03-26 LAB — CBC WITH DIFFERENTIAL (CANCER CENTER ONLY)
Abs Immature Granulocytes: 0 10*3/uL (ref 0.00–0.07)
Basophils Absolute: 0.1 10*3/uL (ref 0.0–0.1)
Basophils Relative: 1 %
Eosinophils Absolute: 0.1 10*3/uL (ref 0.0–0.5)
Eosinophils Relative: 2 %
HCT: 40.2 % (ref 36.0–46.0)
Hemoglobin: 12.5 g/dL (ref 12.0–15.0)
Immature Granulocytes: 0 %
Lymphocytes Relative: 36 %
Lymphs Abs: 2.2 10*3/uL (ref 0.7–4.0)
MCH: 28 pg (ref 26.0–34.0)
MCHC: 31.1 g/dL (ref 30.0–36.0)
MCV: 90.1 fL (ref 80.0–100.0)
Monocytes Absolute: 0.7 10*3/uL (ref 0.1–1.0)
Monocytes Relative: 11 %
Neutro Abs: 3 10*3/uL (ref 1.7–7.7)
Neutrophils Relative %: 50 %
Platelet Count: 282 10*3/uL (ref 150–400)
RBC: 4.46 MIL/uL (ref 3.87–5.11)
RDW: 13.9 % (ref 11.5–15.5)
WBC Count: 6 10*3/uL (ref 4.0–10.5)
nRBC: 0 % (ref 0.0–0.2)

## 2022-03-26 LAB — RETICULOCYTES
Immature Retic Fract: 8.4 % (ref 2.3–15.9)
RBC.: 4.39 MIL/uL (ref 3.87–5.11)
Retic Count, Absolute: 63.7 10*3/uL (ref 19.0–186.0)
Retic Ct Pct: 1.5 % (ref 0.4–3.1)

## 2022-03-26 LAB — FERRITIN: Ferritin: 53 ng/mL (ref 11–307)

## 2022-03-26 LAB — VITAMIN B12: Vitamin B-12: 226 pg/mL (ref 180–914)

## 2022-03-26 LAB — VITAMIN D 25 HYDROXY (VIT D DEFICIENCY, FRACTURES): Vit D, 25-Hydroxy: 61.06 ng/mL (ref 30–100)

## 2022-03-26 MED ORDER — CYANOCOBALAMIN 1000 MCG/ML IJ SOLN: INTRAMUSCULAR | 2 refills | Status: DC

## 2022-03-26 NOTE — Progress Notes (Signed)
?Hematology and Oncology Follow Up Visit ? ?Sheri [name] ?[id number] ?16-Oct-1976 46 y.o. ?03/26/2022 ? ? 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  ?Current Therapy:        ?IV iron as indicated ?B 12 1,000 mcg SQ injection once every month - self administers ?  ?Interim History:  Sheri [name] is here today for follow-up. She is symptomatic with fatigue.  ?No blood loss noted. No bruising or petechiae.  ?She has mild SOB with exertion and rests when needed.  ?No fever, chills, n/v, cough, rash, dizziness, chest pain, palpitations, abdominal pain or changes in bowel or bladder habits.  ?No swelling, tenderness, numbness or tingling in her extremities at this time. She has occasional swelling in her ankles that [name] and goes.  ?No falls or syncope reported.  ?Appetite and hydration are good. Her weight is stable at 267 lbs.  ? ?ECOG Performance Status: 1 - Symptomatic but completely ambulatory ? ?Medications:  ?Allergies as of 03/26/2022   ? ?   Reactions  ? Aspirin Other (See Comments)  ? Pt. Has had gastric bypass surgery.  ASA causes ulcers.   ? ?  ? ?  ?Medication List  ?  ? ?  ? Accurate as of Mar 26, 2022  2:50 PM. If you have any questions, ask your nurse or doctor.  ?  ?  ? ?  ? ?cyanocobalamin 1000 MCG/ML injection ?Commonly known as: (VITAMIN B-12) ?INJECT 1 ML (1,000 MCG TOTAL) INTO THE MUSCLE EVERY 30 DAYS. ?  ?escitalopram 10 MG tablet ?Commonly known as: Lexapro ?Take 1.5 tablets (15 mg total) by mouth daily. ?  ?montelukast 10 MG tablet ?Commonly known as: SINGULAIR ?Take 1 tablet (10 mg total) by mouth at bedtime. ?  ?Norethindrone-Ethinyl Estradiol-Fe 0.8-25 MG-MCG tablet ?Commonly known as: Kaitlib Fe ?CHEW 1 TABLET DAILY. (TAKESCONTINUOUS ACTIVE ORAL CONTRACEPTIVE PILLS.) ?  ?Vitamin D (Ergocalciferol) 1.25 MG (50000 UNIT) Caps capsule ?Commonly known as: DRISDOL ?TAKE 1 CAPSULE BY MOUTH ONE TIME PER WEEK ?  ? ?  ? ? ?Allergies:   ?Allergies  ?Allergen Reactions  ? Aspirin Other (See Comments)  ?  Pt. Has had gastric bypass surgery.  ASA causes ulcers.   ? ? ?Past Medical History, Surgical history, Social history, and Family History were reviewed and updated. ? ?Review of Systems: ?All other 10 point review of systems is negative.  ? ?Physical Exam: ? weight is 267 lb (121.1 kg). Her oral temperature is 98.2 ?F (36.8 ?C). Her blood pressure is 122/66 and her pulse is 50 (abnormal). Her respiration is 17 and oxygen saturation is 99%.  ? ?Wt Readings from Last 3 Encounters:  ?03/26/22 267 lb (121.1 kg)  ?10/16/21 263 lb 3.2 oz (119.4 kg)  ?09/25/21 269 lb 1.9 oz (122.1 kg)  ? ? ?Ocular: Sclerae unicteric, pupils equal, round and reactive to light ?Ear-nose-throat: Oropharynx clear, dentition fair ?Lymphatic: No cervical or supraclavicular adenopathy ?Lungs no rales or rhonchi, good excursion bilaterally ?Heart regular rate and rhythm, no murmur appreciated ?Abd soft, nontender, positive bowel sounds ?MSK no focal spinal tenderness, no joint edema ?Neuro: non-focal, well-oriented, appropriate affect ?Breasts: Deferred  ? ?Lab Results  ?Component Value Date  ? WBC 6.0 03/26/2022  ? HGB 12.5 03/26/2022  ? HCT 40.2 03/26/2022  ? MCV 90.1 03/26/2022  ? PLT 282 03/26/2022  ? ?Lab Results  ?Component Value Date  ? FERRITIN 71 09/25/2021  ? IRON 143 (H) 09/25/2021  ?  TIBC 348 09/25/2021  ? UIBC 205 09/25/2021  ? IRONPCTSAT 41 09/25/2021  ? ?Lab Results  ?Component Value Date  ? RETICCTPCT 1.5 03/26/2022  ? RBC 4.39 03/26/2022  ? ?No results found for: KPAFRELGTCHN, LAMBDASER, KAPLAMBRATIO ?No results found for: IGGSERUM, IGA, IGMSERUM ?No results found for: TOTALPROTELP, ALBUMINELP, A1GS, A2GS, BETS, BETA2SER, GAMS, MSPIKE, SPEI ?  Chemistry   ?   ?Component Value Date/Time  ? NA 141 09/25/2021 1431  ? NA 144 10/08/2017 0812  ? NA 140 07/10/2017 0838  ? K 3.8 09/25/2021 1431  ? K 3.9 10/08/2017 0812  ? K 3.6 07/10/2017 0838  ? CL 106 09/25/2021 1431   ? CL 109 (H) 10/08/2017 4765  ? CO2 26 09/25/2021 1431  ? CO2 25 10/08/2017 0812  ? CO2 23 07/10/2017 0838  ? BUN 14 09/25/2021 1431  ? BUN 9 10/08/2017 0812  ? BUN 9.0 07/10/2017 0838  ? CREATININE 0.87 09/25/2021 1431  ? CREATININE 0.8 10/08/2017 0812  ? CREATININE 0.8 07/10/2017 0838  ?    ?Component Value Date/Time  ? CALCIUM 9.9 09/25/2021 1431  ? CALCIUM 9.1 10/08/2017 0812  ? CALCIUM 9.2 07/10/2017 0838  ? ALKPHOS 101 09/25/2021 1431  ? ALKPHOS 78 10/08/2017 0812  ? ALKPHOS 127 07/10/2017 0838  ? AST 27 09/25/2021 1431  ? AST 22 07/10/2017 0838  ? ALT 35 09/25/2021 1431  ? ALT 23 10/08/2017 0812  ? ALT 22 07/10/2017 0838  ? BILITOT 0.4 09/25/2021 1431  ? BILITOT 0.34 07/10/2017 0838  ?  ? ? ? ?Impression and Plan: Sheri [name] is a very pleasant 46 yo [place] American female with iron, B-12 and Vit D deficiency 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  ?B 12 refilled.  ?Iron studies pending.  ?Follow-up in 6 months.  ? ?Lottie Dawson, NP ?5/15/20232:50 PM ? ?

## 2022-03-27 LAB — IRON AND IRON BINDING CAPACITY (CC-WL,HP ONLY)
Iron: 113 ug/dL (ref 28–170)
Saturation Ratios: 32 % — ABNORMAL HIGH (ref 10.4–31.8)
TIBC: 358 ug/dL (ref 250–450)
UIBC: 245 ug/dL (ref 148–442)

## 2022-03-28 ENCOUNTER — Telehealth: Payer: Self-pay | Admitting: *Deleted

## 2022-03-28 NOTE — Telephone Encounter (Signed)
Per 03/26/22 los - called and gave upcoming appointments - requested call back to confirm ?

## 2022-04-10 ENCOUNTER — Other Ambulatory Visit: Payer: Self-pay | Admitting: Obstetrics & Gynecology

## 2022-04-10 DIAGNOSIS — Z1231 Encounter for screening mammogram for malignant neoplasm of breast: Secondary | ICD-10-CM

## 2022-04-11 ENCOUNTER — Ambulatory Visit
Admission: RE | Admit: 2022-04-11 | Discharge: 2022-04-11 | Disposition: A | Discharge status: Home / Self Care | Payer: No Typology Code available for payment source | Source: Ambulatory Visit | Attending: Obstetrics & Gynecology | Admitting: Obstetrics & Gynecology

## 2022-04-11 DIAGNOSIS — Z1231 Encounter for screening mammogram for malignant neoplasm of breast: Secondary | ICD-10-CM

## 2022-04-24 ENCOUNTER — Other Ambulatory Visit: Payer: Self-pay | Admitting: Family

## 2022-04-24 DIAGNOSIS — E559 Vitamin D deficiency, unspecified: Secondary | ICD-10-CM

## 2022-09-17 ENCOUNTER — Telehealth: Payer: Self-pay | Admitting: *Deleted

## 2022-09-17 NOTE — Telephone Encounter (Signed)
Called patient and lvm about rescheduling appointment with sarah - requested callback to confirm.

## 2022-09-24 ENCOUNTER — Other Ambulatory Visit: Payer: Self-pay

## 2022-09-24 ENCOUNTER — Encounter: Payer: Self-pay | Admitting: Family

## 2022-09-24 ENCOUNTER — Inpatient Hospital Stay (HOSPITAL_BASED_OUTPATIENT_CLINIC_OR_DEPARTMENT_OTHER): Payer: No Typology Code available for payment source | Admitting: Family

## 2022-09-24 ENCOUNTER — Inpatient Hospital Stay: Payer: No Typology Code available for payment source | Attending: Hematology & Oncology

## 2022-09-24 DIAGNOSIS — D510 Vitamin B12 deficiency anemia due to intrinsic factor deficiency: Secondary | ICD-10-CM | POA: Diagnosis not present

## 2022-09-24 DIAGNOSIS — E559 Vitamin D deficiency, unspecified: Secondary | ICD-10-CM

## 2022-09-24 DIAGNOSIS — E538 Deficiency of other specified B group vitamins: Secondary | ICD-10-CM | POA: Diagnosis not present

## 2022-09-24 DIAGNOSIS — D508 Other iron deficiency anemias: Secondary | ICD-10-CM | POA: Insufficient documentation

## 2022-09-24 DIAGNOSIS — Z9884 Bariatric surgery status: Secondary | ICD-10-CM | POA: Diagnosis not present

## 2022-09-24 LAB — RETICULOCYTES
Immature Retic Fract: 5.5 % (ref 2.3–15.9)
RBC.: 4.68 MIL/uL (ref 3.87–5.11)
Retic Count, Absolute: 74.9 10*3/uL (ref 19.0–186.0)
Retic Ct Pct: 1.6 % (ref 0.4–3.1)

## 2022-09-24 LAB — CBC WITH DIFFERENTIAL (CANCER CENTER ONLY)
Abs Immature Granulocytes: 0.04 10*3/uL (ref 0.00–0.07)
Basophils Absolute: 0.1 10*3/uL (ref 0.0–0.1)
Basophils Relative: 1 %
Eosinophils Absolute: 0.2 10*3/uL (ref 0.0–0.5)
Eosinophils Relative: 3 %
HCT: 42.1 % (ref 36.0–46.0)
Hemoglobin: 13.1 g/dL (ref 12.0–15.0)
Immature Granulocytes: 1 %
Lymphocytes Relative: 34 %
Lymphs Abs: 2.1 10*3/uL (ref 0.7–4.0)
MCH: 28 pg (ref 26.0–34.0)
MCHC: 31.1 g/dL (ref 30.0–36.0)
MCV: 90 fL (ref 80.0–100.0)
Monocytes Absolute: 0.7 10*3/uL (ref 0.1–1.0)
Monocytes Relative: 11 %
Neutro Abs: 3.2 10*3/uL (ref 1.7–7.7)
Neutrophils Relative %: 50 %
Platelet Count: 280 10*3/uL (ref 150–400)
RBC: 4.68 MIL/uL (ref 3.87–5.11)
RDW: 14.2 % (ref 11.5–15.5)
WBC Count: 6.3 10*3/uL (ref 4.0–10.5)
nRBC: 0 % (ref 0.0–0.2)

## 2022-09-24 LAB — VITAMIN B12: Vitamin B-12: 221 pg/mL (ref 180–914)

## 2022-09-24 LAB — FERRITIN: Ferritin: 33 ng/mL (ref 11–307)

## 2022-09-24 LAB — VITAMIN D 25 HYDROXY (VIT D DEFICIENCY, FRACTURES): Vit D, 25-Hydroxy: 50.69 ng/mL (ref 30–100)

## 2022-09-24 MED ORDER — CYANOCOBALAMIN 1000 MCG/ML IJ SOLN: INTRAMUSCULAR | 2 refills | Status: DC

## 2022-09-24 NOTE — Progress Notes (Signed)
Hematology and Oncology Follow Up Visit  Sheri [name] [id number] Feb 13, 1976 46 y.o. 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 She is feeling fatigued. She notes dizziness and SOB with exertion at times.  She has also had some numbness, tingling and puffiness in her hands and feet that comes and goes.  She did have a fall related to the dizziness.  She states that she has had a couple of nose bleeds that stopped within 10 minutes. She has also had a small amount of blood in her stool a couple times. No other blood loss noted. No bruising or petechiae.  No fever, chills, n/v, cough, rash,  chest pain, palpitations, abdominal pain or changes in bowel or bladder habits.  No swelling, tenderness, numbness or tingling in her extremities at this time.  Appetite and hydration are good. Weight is stable at 274 lbs.   ECOG Performance Status: 1 - Symptomatic but completely ambulatory  Medications:  Allergies as of 09/24/2022       Reactions   Aspirin Other (See Comments)   Pt. Has had gastric bypass surgery.  ASA causes ulcers.         Medication List        Accurate as of September 24, 2022  2:27 PM. If you have any questions, ask your nurse or doctor.          B-D 3CC LUER-LOK SYR 23GX1-1/2 23G X 1-1/2" 3 ML Misc Generic drug: SYRINGE-NEEDLE (DISP) 3 ML SMARTSIG:Injection Once a Month   cyanocobalamin 1000 MCG/ML injection Commonly known as: VITAMIN B12 INJECT 1 ML (1,000 MCG TOTAL) INTO THE MUSCLE EVERY 30 DAYS.   escitalopram 10 MG tablet Commonly known as: Lexapro Take 1.5 tablets (15 mg total) by mouth daily.   montelukast 10 MG tablet Commonly known as: SINGULAIR Take 1 tablet (10 mg total) by mouth at bedtime.    Norethindrone-Ethinyl Estradiol-Fe 0.8-25 MG-MCG tablet Commonly known as: Kaitlib Fe CHEW 1 TABLET DAILY. (TAKESCONTINUOUS ACTIVE ORAL CONTRACEPTIVE PILLS.)   Vitamin D (Ergocalciferol) 1.25 MG (50000 UNIT) Caps capsule Commonly known as: DRISDOL TAKE 1 CAPSULE BY MOUTH ONE TIME PER WEEK        Allergies:  Allergies  Allergen Reactions   Aspirin Other (See Comments)    Pt. Has had gastric bypass surgery.  ASA causes ulcers.     Past Medical History, Surgical history, Social history, and Family History were reviewed and updated.  Review of Systems: All other 10 point review of systems is negative.   Physical Exam:  height is '5\' 5"'$  (1.651 m) and weight is 274 lb (124.3 kg). Her oral temperature is 98.2 F (36.8 C). Her blood pressure is 120/57 (abnormal) and her pulse is 61. Her respiration is 18 and oxygen saturation is 98%.   Wt Readings from Last 3 Encounters:  09/24/22 274 lb (124.3 kg)  03/26/22 267 lb (121.1 kg)  10/16/21 263 lb 3.2 oz (119.4 kg)    Ocular: Sclerae unicteric, pupils equal, round and reactive to light Ear-nose-throat: Oropharynx clear, dentition fair Lymphatic: No cervical or supraclavicular adenopathy Lungs no rales or rhonchi, good excursion bilaterally Heart regular rate and rhythm, no murmur appreciated Abd soft, nontender, positive bowel sounds MSK  no focal spinal tenderness, no joint edema Neuro: non-focal, well-oriented, appropriate affect Breasts: Deferred   Lab Results  Component Value Date   WBC 6.3 09/24/2022   HGB 13.1 09/24/2022   HCT 42.1 09/24/2022   MCV 90.0 09/24/2022   PLT 280 09/24/2022   Lab Results  Component Value Date   FERRITIN 53 03/26/2022   IRON 113 03/26/2022   TIBC 358 03/26/2022   UIBC 245 03/26/2022   IRONPCTSAT 32 (H) 03/26/2022   Lab Results  Component Value Date   RETICCTPCT 1.6 09/24/2022   RBC 4.68 09/24/2022   RBC 4.68 09/24/2022   No results found for: "KPAFRELGTCHN", "LAMBDASER",  "KAPLAMBRATIO" No results found for: "IGGSERUM", "IGA", "IGMSERUM" No results found for: "TOTALPROTELP", "ALBUMINELP", "A1GS", "A2GS", "BETS", "BETA2SER", "GAMS", "MSPIKE", "SPEI"   Chemistry      Component Value Date/Time   NA 136 03/26/2022 1406   NA 144 10/08/2017 0812   NA 140 07/10/2017 0838   K 4.1 03/26/2022 1406   K 3.9 10/08/2017 0812   K 3.6 07/10/2017 0838   CL 106 03/26/2022 1406   CL 109 (H) 10/08/2017 0812   CO2 23 03/26/2022 1406   CO2 25 10/08/2017 0812   CO2 23 07/10/2017 0838   BUN 14 03/26/2022 1406   BUN 9 10/08/2017 0812   BUN 9.0 07/10/2017 0838   CREATININE 0.90 03/26/2022 1406   CREATININE 0.8 10/08/2017 0812   CREATININE 0.8 07/10/2017 0838      Component Value Date/Time   CALCIUM 9.2 03/26/2022 1406   CALCIUM 9.1 10/08/2017 0812   CALCIUM 9.2 07/10/2017 0838   ALKPHOS 74 03/26/2022 1406   ALKPHOS 78 10/08/2017 0812   ALKPHOS 127 07/10/2017 0838   AST 20 03/26/2022 1406   AST 22 07/10/2017 0838   ALT 22 03/26/2022 1406   ALT 23 10/08/2017 0812   ALT 22 07/10/2017 0838   BILITOT 0.3 03/26/2022 1406   BILITOT 0.34 07/10/2017 0838       Impression and Plan: Sheri [name] is a very pleasant 46 yo African American female with iron, B-12 and Vit D deficiency 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  B 12 refilled.  B 12, vitamin D and iron studies pending.  Follow-up in 6 months.   Lottie Dawson, NP 11/13/20232:27 PM

## 2022-09-25 ENCOUNTER — Telehealth: Payer: Self-pay

## 2022-09-25 LAB — IRON AND IRON BINDING CAPACITY (CC-WL,HP ONLY)
Iron: 116 ug/dL (ref 28–170)
Saturation Ratios: 29 % (ref 10.4–31.8)
TIBC: 399 ug/dL (ref 250–450)
UIBC: 283 ug/dL (ref 148–442)

## 2022-09-25 NOTE — Telephone Encounter (Signed)
-----   Message from Celso Amy, NP sent at 09/25/2022 11:57 AM EST ----- Iron studies and vitamin D look good. B 12 on low end of normal. Make sure to administer every month!  [name]  ----- Message ----- From: Buel Ream, Lab In [place] Sent: 09/24/2022   2:08 PM EST To: Celso Amy, NP

## 2022-09-26 ENCOUNTER — Ambulatory Visit: Payer: No Typology Code available for payment source | Admitting: Family

## 2022-09-26 ENCOUNTER — Other Ambulatory Visit: Payer: No Typology Code available for payment source

## 2022-09-27 ENCOUNTER — Ambulatory Visit: Payer: No Typology Code available for payment source | Admitting: Medical Oncology

## 2022-09-27 ENCOUNTER — Other Ambulatory Visit: Payer: No Typology Code available for payment source

## 2022-10-18 ENCOUNTER — Encounter (HOSPITAL_BASED_OUTPATIENT_CLINIC_OR_DEPARTMENT_OTHER): Payer: Self-pay | Admitting: Obstetrics & Gynecology

## 2022-10-18 ENCOUNTER — Ambulatory Visit (INDEPENDENT_AMBULATORY_CARE_PROVIDER_SITE_OTHER): Payer: No Typology Code available for payment source | Admitting: Obstetrics & Gynecology

## 2022-10-18 VITALS — BP 127/78 | HR 59 | Ht 65.5 in | Wt 272.6 lb

## 2022-10-18 DIAGNOSIS — Z9884 Bariatric surgery status: Secondary | ICD-10-CM | POA: Diagnosis not present

## 2022-10-18 DIAGNOSIS — F341 Dysthymic disorder: Secondary | ICD-10-CM | POA: Diagnosis not present

## 2022-10-18 DIAGNOSIS — Z23 Encounter for immunization: Secondary | ICD-10-CM | POA: Diagnosis not present

## 2022-10-18 DIAGNOSIS — Z01419 Encounter for gynecological examination (general) (routine) without abnormal findings: Secondary | ICD-10-CM | POA: Diagnosis not present

## 2022-10-18 DIAGNOSIS — D510 Vitamin B12 deficiency anemia due to intrinsic factor deficiency: Secondary | ICD-10-CM

## 2022-10-18 DIAGNOSIS — T7840XD Allergy, unspecified, subsequent encounter: Secondary | ICD-10-CM

## 2022-10-18 MED ORDER — MONTELUKAST SODIUM 10 MG PO TABS: 10.0000 mg | ORAL_TABLET | Freq: Every day | ORAL | 4 refills | Status: DC

## 2022-10-18 MED ORDER — ESCITALOPRAM OXALATE 10 MG PO TABS: 15.0000 mg | ORAL_TABLET | Freq: Every day | ORAL | 3 refills | Status: DC

## 2022-10-18 MED ORDER — NORETHIN-ETH ESTRADIOL-FE 0.8-25 MG-MCG PO CHEW: CHEWABLE_TABLET | ORAL | 4 refills | Status: DC

## 2022-10-18 NOTE — Progress Notes (Signed)
46 y.o. G2P0 Single Black or [place] American female here for annual exam.  Doing well but having a lot of fatigue.  Lab work recently done.  Has follow up scheduled in 2024.  She feels will need lab work earlier.  Exercising regularly but can't seem to get her weight to change.    Denies vaginal bleeding.    No LMP recorded. (Menstrual status: Oral contraceptives).          Sexually active: No.  The current method of family planning is OCP (estrogen/progesterone).    Exercising: yes Smoker:  no  Health Maintenance: Pap:  11/02/20, neg HR HPV History of abnormal Pap:  no MMG:  04/11/21 Colonoscopy:  01/2021, follow up 7 years BMD:   not indicated Screening Labs: 11/2021   reports that she has never smoked. She has never used smokeless tobacco. She reports that she does not drink alcohol and does not use drugs.  Past Medical History:  Diagnosis Date   Allergy    seasonal allergies   Anemia    and low B12-on meds   GERD (gastroesophageal reflux disease)    with certain foods/due to gastric bypass   Heart murmur    as child - no problems as adult   History of blood transfusion 2004   [facility], after surgery   Hypothyroidism    history=resolved   Inflammation of joint of knee    right knee-on anti-inflammatory, arthritis   Seasonal allergies    Vitamin B 12 deficiency     Past Surgical History:  Procedure Laterality Date   ABDOMINOPLASTY  2004   BREAST REDUCTION SURGERY  2005   GASTRIC BYPASS  2001   Dr Duwayne Heck, in [place] Right 05/17/2014   Procedure: LAPAROSCOPIC  partial  right salpingo-oohorectomy  and cystoscopy;  Surgeon: Lyman Speller, MD;  Location: [facility];  Service: Gynecology;  Laterality: Right;   REDUCTION MAMMAPLASTY Bilateral 2004   WISDOM TOOTH EXTRACTION  2014   upper right    Current Outpatient Medications  Medication Sig Dispense Refill   B-D 3CC LUER-LOK SYR 23GX1-1/2 23G X 1-1/2" 3 ML MISC  SMARTSIG:Injection Once a Month     cyanocobalamin (VITAMIN B12) 1000 MCG/ML injection INJECT 1 ML (1,000 MCG TOTAL) INTO THE MUSCLE EVERY 30 DAYS. 10 mL 2   escitalopram (LEXAPRO) 10 MG tablet Take 1.5 tablets (15 mg total) by mouth daily. 135 tablet 3   montelukast (SINGULAIR) 10 MG tablet Take 1 tablet (10 mg total) by mouth at bedtime. 90 tablet 4   Norethindrone-Ethinyl Estradiol-Fe (KAITLIB FE) 0.8-25 MG-MCG tablet CHEW 1 TABLET DAILY. (TAKESCONTINUOUS ACTIVE ORAL CONTRACEPTIVE PILLS.) 112 tablet 4   Vitamin D, Ergocalciferol, (DRISDOL) 1.25 MG (50000 UNIT) CAPS capsule TAKE 1 CAPSULE BY MOUTH ONE TIME PER WEEK 12 capsule 4   No current facility-administered medications for this visit.    Family History  Problem Relation Age of Onset   CVA Maternal Grandmother    Hypertension Brother    Diabetes Brother    Heart disease Brother    Diabetes Brother    Hypertension Brother    Heart disease Brother    Diabetes Brother    Hypertension Brother    Heart disease Brother    Diabetes Sister    Hypertension Sister    Breast cancer Sister 68   Stroke Sister    Diabetes Sister    Hypertension Sister    Colon polyps Neg Hx  Colon cancer Neg Hx    Esophageal cancer Neg Hx    Rectal cancer Neg Hx    Stomach cancer Neg Hx     ROS: Constitutional: negative Genitourinary:negative  Exam:   BP 127/78   Pulse (!) 59   Ht 5' 5.5" (1.664 m)   Wt 272 lb 9.6 oz (123.7 kg)   BMI 44.67 kg/m   Height: 5' 5.5" (166.4 cm)  General appearance: alert, cooperative and appears stated age Head: Normocephalic, without obvious abnormality, atraumatic Neck: no adenopathy, supple, symmetrical, trachea midline and thyroid normal to inspection and palpation Lungs: clear to auscultation bilaterally Breasts: normal appearance, no masses or tenderness Heart: regular rate and rhythm Abdomen: soft, non-tender; bowel sounds normal; no masses,  no organomegaly Extremities: extremities normal,  atraumatic, no cyanosis or edema Skin: Skin color, texture, turgor normal. No rashes or lesions Lymph nodes: Cervical, supraclavicular, and axillary nodes normal. No abnormal inguinal nodes palpated Neurologic: Grossly normal   Pelvic: External genitalia:  no lesions              Urethra:  normal appearing urethra with no masses, tenderness or lesions              Bartholins and Skenes: normal                 Vagina: normal appearing vagina with normal color and no discharge, no lesions              Cervix: no lesions              Pap taken: No. Bimanual Exam:  Uterus:  normal size, contour, position, consistency, mobility, non-tender              Adnexa: no mass, fullness, tenderness               Rectovaginal: Confirms               Anus:  normal sphincter tone, no lesions  Chaperone, Ezekiel Ina, RN, was present for exam.  Assessment/Plan: 1. Well woman exam with routine gynecological exam - Pap smear neg with neg HR HPV 2021 - Mammogram up to date - Colonoscopy 2022 - Bone mineral density not indicated - lab work done with PCP, Dr. Melford Aase - vaccines reviewed/updated.  Tdap updated today - Norethindrone-Ethinyl Estradiol-Fe (KAITLIB FE) 0.8-25 MG-MCG tablet; CHEW 1 TABLET DAILY. (TAKESCONTINUOUS ACTIVE ORAL CONTRACEPTIVE PILLS.)  Dispense: 112 tablet; Refill: 4  2.  Pernicious anemia - followed by Dr. Marin Olp  3. Dysthymia - escitalopram (LEXAPRO) 10 MG tablet; Take 1.5 tablets (15 mg total) by mouth daily.  Dispense: 135 tablet; Refill: 3  4. Allergy, subsequent encounter - montelukast (SINGULAIR) 10 MG tablet; Take 1 tablet (10 mg total) by mouth at bedtime.  Dispense: 90 tablet; Refill: 4  5. History of Roux-en-Y gastric bypass

## 2022-10-18 NOTE — Patient Instructions (Addendum)
Sheri [name], [place], [place] [id number] Phone: [phone]  [facility]

## 2022-11-30 ENCOUNTER — Telehealth: Payer: No Typology Code available for payment source | Admitting: Family Medicine

## 2022-11-30 DIAGNOSIS — R1011 Right upper quadrant pain: Secondary | ICD-10-CM | POA: Diagnosis not present

## 2022-11-30 NOTE — Patient Instructions (Signed)

## 2022-11-30 NOTE — Progress Notes (Signed)
Virtual Visit Consent   Sheri [name], you are scheduled for a virtual visit with a [facility] provider today. Just as with appointments in the office, your consent must be obtained to participate. Your consent will be active for this visit and any virtual visit you may have with one of our providers in the next 365 days. If you have a MyChart account, a copy of this consent can be sent to you electronically.  As this is a virtual visit, video technology does not allow for your provider to perform a traditional examination. This may limit your provider's ability to fully assess your condition. If your provider identifies any concerns that need to be evaluated in person or the need to arrange testing (such as labs, EKG, etc.), we will make arrangements to do so. Although advances in technology are sophisticated, we cannot ensure that it will always work on either your end or our end. If the connection with a video visit is poor, the visit may have to be switched to a telephone visit. With either a video or telephone visit, we are not always able to ensure that we have a secure connection.  By engaging in this virtual visit, you consent to the provision of healthcare and authorize for your insurance to be billed (if applicable) for the services provided during this visit. Depending on your insurance coverage, you may receive a charge related to this service.  I need to obtain your verbal consent now. Are you willing to proceed with your visit today? Sheri [name] has provided verbal consent on 11/30/2022 for a virtual visit (video or telephone). Dellia Nims, FNP  Date: 11/30/2022 2:11 PM  Virtual Visit via Video Note   I, Dellia Nims, connected with  Sheri [name]  ([id number], May 13, 1976) on 11/30/22 at  2:15 PM EST by a video-enabled telemedicine application and verified that I am speaking with the correct person using two identifiers.  Location: Patient: Virtual Visit Location Patient:  Home Provider: Virtual Visit Location Provider: Home Office   I discussed the limitations of evaluation and management by telemedicine and the availability of in person appointments. The patient expressed understanding and agreed to proceed.    History of Present Illness: Sheri [name] is a 47 y.o. who identifies as a female who was assigned female at birth, and is being seen today for RUQ abd pain with increased gas, pain around to back for 2-3 days, no fever, no chills, no nausea vomiting or diarrhea. 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 Has had gastric bypass surgery.  ASA causes ulcers.    Medications:  Current Outpatient Medications:    B-D 3CC LUER-LOK SYR 23GX1-1/2 23G X 1-1/2" 3 ML MISC, SMARTSIG:Injection Once a Month, Disp: , Rfl:    cyanocobalamin (VITAMIN B12) 1000 MCG/ML injection, INJECT 1 ML (1,000 MCG TOTAL) INTO THE MUSCLE EVERY 30 DAYS., Disp: 10 mL, Rfl: 2   escitalopram (LEXAPRO) 10 MG tablet, Take 1.5 tablets (15 mg total) by mouth daily., Disp: 135 tablet, Rfl: 3   montelukast (SINGULAIR) 10 MG tablet, Take 1 tablet (10 mg total) by mouth at bedtime., Disp: 90 tablet, Rfl: 4   Norethindrone-Ethinyl Estradiol-Fe (KAITLIB FE) 0.8-25  MG-MCG tablet, CHEW 1 TABLET DAILY. (TAKESCONTINUOUS ACTIVE ORAL CONTRACEPTIVE PILLS.), Disp: 112 tablet, Rfl: 4   Vitamin D, Ergocalciferol, (DRISDOL) 1.25 MG (50000 UNIT) CAPS capsule, TAKE 1 CAPSULE BY MOUTH ONE TIME PER WEEK, Disp: 12 capsule, Rfl: 4  Observations/Objective: Patient is well-developed, well-nourished in no acute distress.  Resting comfortably  at home.  Head is normocephalic, atraumatic.  No labored breathing.  Speech is clear and coherent with logical  content.  Patient is alert and oriented at baseline.    Assessment and Plan: 1. Right upper quadrant abdominal pain  Increase fluids, no fatty meals,, ED if sx worsen. Make apptmt with PCP for possible US abdomen.   Follow Up Instructions: I discussed the assessment and treatment plan with the patient. The patient was provided an opportunity to ask questions and all were answered. The patient agreed with the plan and demonstrated an understanding of the instructions.  A copy of instructions were sent to the patient via MyChart unless otherwise noted below.     The patient was advised to call back or seek an in-person evaluation if the symptoms worsen or if the condition fails to improve as anticipated.  Time:  I spent 10 minutes with the patient via telehealth technology discussing the above problems/concerns.    Dellia Nims, FNP

## 2022-12-01 ENCOUNTER — Emergency Department (HOSPITAL_BASED_OUTPATIENT_CLINIC_OR_DEPARTMENT_OTHER): Payer: No Typology Code available for payment source

## 2022-12-01 ENCOUNTER — Encounter (HOSPITAL_BASED_OUTPATIENT_CLINIC_OR_DEPARTMENT_OTHER): Payer: Self-pay | Admitting: Emergency Medicine

## 2022-12-01 ENCOUNTER — Emergency Department (HOSPITAL_BASED_OUTPATIENT_CLINIC_OR_DEPARTMENT_OTHER)
Admission: EM | Admit: 2022-12-01 | Discharge: 2022-12-01 | Disposition: A | Discharge status: Home / Self Care | Payer: No Typology Code available for payment source | Attending: Emergency Medicine | Admitting: Emergency Medicine

## 2022-12-01 ENCOUNTER — Other Ambulatory Visit: Payer: Self-pay

## 2022-12-01 DIAGNOSIS — R1011 Right upper quadrant pain: Secondary | ICD-10-CM | POA: Diagnosis present

## 2022-12-01 DIAGNOSIS — K8020 Calculus of gallbladder without cholecystitis without obstruction: Secondary | ICD-10-CM | POA: Diagnosis not present

## 2022-12-01 LAB — COMPREHENSIVE METABOLIC PANEL
ALT: 20 U/L (ref 0–44)
AST: 22 U/L (ref 15–41)
Albumin: 4.2 g/dL (ref 3.5–5.0)
Alkaline Phosphatase: 73 U/L (ref 38–126)
Anion gap: 7 (ref 5–15)
BUN: 10 mg/dL (ref 6–20)
CO2: 23 mmol/L (ref 22–32)
Calcium: 9.5 mg/dL (ref 8.9–10.3)
Chloride: 108 mmol/L (ref 98–111)
Creatinine, Ser: 0.74 mg/dL (ref 0.44–1.00)
GFR, Estimated: >60 mL/min (ref >60)
Glucose, Bld: 89 mg/dL (ref 70–99)
Potassium: 3.6 mmol/L (ref 3.5–5.1)
Sodium: 138 mmol/L (ref 135–145)
Total Bilirubin: 0.3 mg/dL (ref 0.3–1.2)
Total Protein: 7.3 g/dL (ref 6.5–8.1)

## 2022-12-01 LAB — CBC WITH DIFFERENTIAL/PLATELET
Abs Immature Granulocytes: 0 10*3/uL (ref 0.00–0.07)
Basophils Absolute: 0.1 10*3/uL (ref 0.0–0.1)
Basophils Relative: 1 %
Eosinophils Absolute: 0.7 10*3/uL — ABNORMAL HIGH (ref 0.0–0.5)
Eosinophils Relative: 11 %
HCT: 41 % (ref 36.0–46.0)
Hemoglobin: 13.1 g/dL (ref 12.0–15.0)
Immature Granulocytes: 0 %
Lymphocytes Relative: 38 %
Lymphs Abs: 2.4 10*3/uL (ref 0.7–4.0)
MCH: 28 pg (ref 26.0–34.0)
MCHC: 32 g/dL (ref 30.0–36.0)
MCV: 87.6 fL (ref 80.0–100.0)
Monocytes Absolute: 0.7 10*3/uL (ref 0.1–1.0)
Monocytes Relative: 11 %
Neutro Abs: 2.4 10*3/uL (ref 1.7–7.7)
Neutrophils Relative %: 39 %
Platelets: 268 10*3/uL (ref 150–400)
RBC: 4.68 MIL/uL (ref 3.87–5.11)
RDW: 13.8 % (ref 11.5–15.5)
WBC: 6.3 10*3/uL (ref 4.0–10.5)
nRBC: 0 % (ref 0.0–0.2)

## 2022-12-01 LAB — LIPASE, BLOOD: Lipase: 28 U/L (ref 11–51)

## 2022-12-01 LAB — TROPONIN I (HIGH SENSITIVITY): Troponin I (High Sensitivity): 3 ng/L (ref <18)

## 2022-12-01 LAB — HCG, SERUM, QUALITATIVE: Preg, Serum: NEGATIVE

## 2022-12-01 MED ORDER — ACETAMINOPHEN 325 MG PO TABS
650.0000 mg | ORAL_TABLET | Freq: Once | ORAL | Status: AC
  Administered 2022-12-01: 650 mg via ORAL
  Filled 2022-12-01: qty 2

## 2022-12-01 MED ORDER — ONDANSETRON HCL 4 MG/2ML IJ SOLN
4.0000 mg | Freq: Once | INTRAMUSCULAR | Status: AC
  Administered 2022-12-01: 4 mg via INTRAVENOUS
  Filled 2022-12-01: qty 2

## 2022-12-01 MED ORDER — FENTANYL CITRATE PF 50 MCG/ML IJ SOSY
50.0000 ug | PREFILLED_SYRINGE | Freq: Once | INTRAMUSCULAR | Status: DC
  Filled 2022-12-01: qty 1

## 2022-12-01 MED ORDER — PANTOPRAZOLE SODIUM 40 MG IV SOLR
40.0000 mg | Freq: Once | INTRAVENOUS | Status: AC
  Administered 2022-12-01: 40 mg via INTRAVENOUS
  Filled 2022-12-01: qty 10

## 2022-12-01 MED ORDER — TRAMADOL HCL 50 MG PO TABS: 50.0000 mg | ORAL_TABLET | Freq: Four times a day (QID) | ORAL | 0 refills | Status: DC | PRN

## 2022-12-01 MED ORDER — SODIUM CHLORIDE 0.9 % IV BOLUS
500.0000 mL | Freq: Once | INTRAVENOUS | Status: AC
  Administered 2022-12-01: 500 mL via INTRAVENOUS

## 2022-12-01 NOTE — ED Triage Notes (Signed)
  Patient comes in with RUQ abdominal pain that radiates to her back.  Patient states that it has been going on for about a week but felt like it was getting better.  No N/V in the last few days.  Hx of gastric bypass.  Pain 6/10, sharp/stabbing.

## 2022-12-01 NOTE — ED Provider Notes (Signed)
[facility] Provider Note   CSN: [id number] Arrival date & time: 12/01/22  1937     History  Chief Complaint  Patient presents with   Abdominal Pain    Sheri [name] is a 47 y.o. female.  She has prior history of gastric bypass Roux-en-Y.  Complaining of 1 week of of right upper quadrant abdominal pain radiating through to her back.  She said it was really bad a few days ago but currently only 7 out of 10.  Associated with some nausea and vomiting.  She said there was a little bit of blood in her vomit and some blood in her stool.  She still has her gallbladder.  No fevers or chills.  No chest pain or shortness of breath.  The history is provided by the patient.  Abdominal Pain Pain location:  RUQ Pain quality: aching   Pain radiates to:  Back Pain severity:  Moderate Onset quality:  Gradual Duration:  1 week Timing:  Constant Progression:  Waxing and waning Chronicity:  New Context: not trauma   Relieved by:  Nothing Worsened by:  Nothing Ineffective treatments:  Position changes Associated symptoms: nausea and vomiting   Associated symptoms: no chest pain, no constipation, no cough, no diarrhea, no dysuria, no fever, no shortness of breath and no sore throat        Home Medications Prior to Admission medications   Medication Sig Start Date End Date Taking? Authorizing Provider  B-D 3CC LUER-LOK SYR 23GX1-1/2 23G X 1-1/2" 3 ML MISC SMARTSIG:Injection Once a Month 06/07/22   [provider]  cyanocobalamin (VITAMIN B12) 1000 MCG/ML injection INJECT 1 ML (1,000 MCG TOTAL) INTO THE MUSCLE EVERY 30 DAYS. 09/24/22   Celso Amy, NP  escitalopram (LEXAPRO) 10 MG tablet Take 1.5 tablets (15 mg total) by mouth daily. 10/18/22   Megan Salon, MD  montelukast (SINGULAIR) 10 MG tablet Take 1 tablet (10 mg total) by mouth at bedtime. 10/18/22   Megan Salon, MD  Norethindrone-Ethinyl Estradiol-Fe (KAITLIB FE) 0.8-25 MG-MCG  tablet CHEW 1 TABLET DAILY. (TAKESCONTINUOUS ACTIVE ORAL CONTRACEPTIVE PILLS.) 10/18/22   Megan Salon, MD  Vitamin D, Ergocalciferol, (DRISDOL) 1.25 MG (50000 UNIT) CAPS capsule TAKE 1 CAPSULE BY MOUTH ONE TIME PER WEEK 04/24/22   Celso Amy, NP      Allergies    Aspirin    Review of Systems   Review of Systems  Constitutional:  Negative for fever.  HENT:  Negative for sore throat.   Respiratory:  Negative for cough and shortness of breath.   Cardiovascular:  Negative for chest pain.  Gastrointestinal:  Positive for abdominal pain, nausea and vomiting. Negative for constipation and diarrhea.  Genitourinary:  Negative for dysuria.  Skin:  Negative for rash.    Physical Exam Updated Vital Signs BP 108/76 (BP Location: Right Arm)   Pulse 82   Temp 98 F (36.7 C) (Oral)   Resp 18   Ht '5\' 6"'$  (1.676 m)   Wt 118.8 kg   SpO2 100%   BMI 42.29 kg/m  Physical Exam Vitals and nursing note reviewed.  Constitutional:      General: She is not in acute distress.    Appearance: Normal appearance. She is well-developed.  HENT:     Head: Normocephalic and atraumatic.  Eyes:     Conjunctiva/sclera: Conjunctivae normal.  Cardiovascular:     Rate and Rhythm: Normal rate and regular rhythm.  Heart sounds: No murmur heard. Pulmonary:     Effort: Pulmonary effort is normal. No respiratory distress.     Breath sounds: Normal breath sounds.  Abdominal:     Palpations: Abdomen is soft.     Tenderness: There is abdominal tenderness in the right upper quadrant. There is no guarding or rebound.  Musculoskeletal:        General: No deformity.     Cervical back: Neck supple.  Skin:    General: Skin is warm and dry.     Capillary Refill: Capillary refill takes less than 2 seconds.  Neurological:     General: No focal deficit present.     Mental Status: She is alert.     ED Results / Procedures / Treatments   Labs (all labs ordered are listed, but only abnormal results are  displayed) Labs Reviewed  CBC WITH DIFFERENTIAL/PLATELET - Abnormal; Notable for the following components:      Result Value   Eosinophils Absolute 0.7 (*)    All other components within normal limits  COMPREHENSIVE METABOLIC PANEL  LIPASE, BLOOD  HCG, SERUM, QUALITATIVE  TROPONIN I (HIGH SENSITIVITY)    EKG EKG Interpretation  Date/Time:  Saturday December 01 2022 19:53:24 EST Ventricular Rate:  79 PR Interval:  166 QRS Duration: 96 QT Interval:  364 QTC Calculation: 417 R Axis:   22 Text Interpretation: Normal sinus rhythm Normal ECG No previous ECGs available Confirmed by Aletta Edouard [phone]) on 12/01/2022 7:55:14 PM  Radiology US Abdomen Limited RUQ (LIVER/GB)  Result Date: 12/01/2022 CLINICAL DATA:  Right upper quadrant pain EXAM: ULTRASOUND ABDOMEN LIMITED RIGHT UPPER QUADRANT COMPARISON:  None Available. FINDINGS: Gallbladder: Multiple shadowing echogenic gallstones are present measuring up to 7 mm. There is no gallbladder wall thickening or pericholecystic fluid. Sonographic Percell Miller sign is positive per sonographer. Common bile duct: Diameter: 3 mm Liver: No focal lesion identified. Within normal limits in parenchymal echogenicity. Portal vein is patent on color Doppler imaging with normal direction of blood flow towards the liver. Other: None. IMPRESSION: Cholelithiasis with positive sonographic Murphy sign. No gallbladder wall thickening or pericholecystic fluid. Findings are equivocal for acute cholecystitis. Electronically Signed   By: Ronney Asters M.D.   On: 12/01/2022 20:43    Procedures Procedures    Medications Ordered in ED Medications  sodium chloride 0.9 % bolus 500 mL (has no administration in time range)  ondansetron (ZOFRAN) injection 4 mg (has no administration in time range)  fentaNYL (SUBLIMAZE) injection 50 mcg (has no administration in time range)    ED Course/ Medical Decision Making/ A&P Clinical Course as of 12/02/22 0942  Sat Dec 01, 2022   2112 Patient's workup consistent with symptomatic cholelithiasis.  Ultrasound does not show acute cholecystitis patient does not have a white count of fever or elevated LFTs.  She would like to follow-up outpatient and I think it is reasonable at this time.  She is hesitant to try any narcotics we will try her on tramadol to see if that helps her but otherwise she can just use Tylenol.  Will give her contact information for general surgery and return instructions discussed. [MB]    Clinical Course User Index [MB] Hayden Rasmussen, MD                             Medical Decision Making Amount and/or Complexity of Data Reviewed Labs: ordered. Radiology: ordered.  Risk OTC drugs. Prescription drug management.  This patient complains of right upper quadrant abdominal pain; this involves an extensive number of treatment Options and is a complaint that carries with it a high risk of complications and morbidity. The differential includes biliary colic, peptic ulcer disease, pneumonia, ACS, colitis  I ordered, reviewed and interpreted labs, which included CBC normal chemistries and LFTs normal pregnancy negative troponin unremarkable I ordered medication IV fluids PPI nausea medication and reviewed PMP when indicated. I ordered imaging studies which included right upper quadrant ultrasound and I independently    visualized and interpreted imaging which showed cholelithiasis, equivocal cholecystitis Previous records obtained and reviewed in epic no recent admissions Cardiac monitoring reviewed, normal sinus rhythm Social determinants considered, no significant barriers Critical Interventions: None  After the interventions stated above, I reevaluated the patient and found patient to be very comfortable appearing with benign abdominal exam Admission and further testing considered, she was offered a CAT scan to further exclude that there is another cause of her symptoms.  She declines this at  this time.  She was also offered consideration for admission and surgery evaluation.  She said she would rather be discharged for now and follow-up as an outpatient.  Will give her contact information for general surgery clinic.  Strict return instructions discussed.         Final Clinical Impression(s) / ED Diagnoses Final diagnoses:  Symptomatic cholelithiasis    Rx / DC Orders ED Discharge Orders          Ordered    traMADol (ULTRAM) 50 MG tablet  Every 6 hours PRN        12/01/22 2114              Hayden Rasmussen, MD 12/02/22 [phone]

## 2022-12-01 NOTE — Discharge Instructions (Signed)
You are seen in the emergency department for 1 week of right upper quadrant abdominal pain.  Your lab work was unremarkable but your ultrasound showed gallstones in your gallbladder.  This is likely cause of your pain.  You will need follow-up with general surgery.  Stick to a low-fat diet.  Return to the emergency department if any high fevers or uncontrolled pain.

## 2022-12-06 ENCOUNTER — Other Ambulatory Visit: Payer: Self-pay

## 2022-12-06 ENCOUNTER — Encounter (HOSPITAL_COMMUNITY): Payer: Self-pay | Admitting: Surgery

## 2022-12-06 NOTE — Progress Notes (Signed)
Ms Cleckley denies chest pain or shortness of breath. Patient denies having any s/s of Covid in her household, also denies any known exposure to Covid.  Ms Vitullo is taking Zepbound for weight loss. Patient was told to not eat or drink after 1800 today. Ms Charley   I called Ms Langi and informed her that she may drink liquids until midnight.  Ms Bernardi's PCP is Dr. Lorriane Shire.

## 2022-12-07 ENCOUNTER — Ambulatory Visit (HOSPITAL_COMMUNITY)
Admission: RE | Admit: 2022-12-07 | Discharge: 2022-12-07 | Disposition: A | Discharge status: Home / Self Care | Payer: No Typology Code available for payment source | Source: Ambulatory Visit | Attending: Surgery | Admitting: Surgery

## 2022-12-07 ENCOUNTER — Ambulatory Visit (HOSPITAL_COMMUNITY): Payer: No Typology Code available for payment source | Admitting: Anesthesiology

## 2022-12-07 ENCOUNTER — Encounter (HOSPITAL_COMMUNITY): Payer: Self-pay | Admitting: Surgery

## 2022-12-07 ENCOUNTER — Ambulatory Visit (HOSPITAL_BASED_OUTPATIENT_CLINIC_OR_DEPARTMENT_OTHER): Payer: No Typology Code available for payment source | Admitting: Anesthesiology

## 2022-12-07 ENCOUNTER — Encounter (HOSPITAL_COMMUNITY)
Admission: RE | Disposition: A | Discharge status: Home / Self Care | Payer: Self-pay | Source: Ambulatory Visit | Attending: Surgery

## 2022-12-07 DIAGNOSIS — Z9884 Bariatric surgery status: Secondary | ICD-10-CM | POA: Insufficient documentation

## 2022-12-07 DIAGNOSIS — K219 Gastro-esophageal reflux disease without esophagitis: Secondary | ICD-10-CM | POA: Insufficient documentation

## 2022-12-07 DIAGNOSIS — G4730 Sleep apnea, unspecified: Secondary | ICD-10-CM | POA: Insufficient documentation

## 2022-12-07 DIAGNOSIS — D638 Anemia in other chronic diseases classified elsewhere: Secondary | ICD-10-CM | POA: Diagnosis not present

## 2022-12-07 DIAGNOSIS — Z6841 Body Mass Index (BMI) 40.0 and over, adult: Secondary | ICD-10-CM | POA: Insufficient documentation

## 2022-12-07 DIAGNOSIS — E039 Hypothyroidism, unspecified: Secondary | ICD-10-CM | POA: Diagnosis not present

## 2022-12-07 DIAGNOSIS — K8000 Calculus of gallbladder with acute cholecystitis without obstruction: Secondary | ICD-10-CM | POA: Diagnosis not present

## 2022-12-07 DIAGNOSIS — K8010 Calculus of gallbladder with chronic cholecystitis without obstruction: Secondary | ICD-10-CM | POA: Diagnosis not present

## 2022-12-07 DIAGNOSIS — K8020 Calculus of gallbladder without cholecystitis without obstruction: Secondary | ICD-10-CM | POA: Diagnosis present

## 2022-12-07 HISTORY — PX: CHOLECYSTECTOMY: SHX55

## 2022-12-07 HISTORY — DX: Family history of other specified conditions: Z84.89

## 2022-12-07 HISTORY — DX: Sleep apnea, unspecified: G47.30

## 2022-12-07 SURGERY — LAPAROSCOPIC CHOLECYSTECTOMY
Anesthesia: General

## 2022-12-07 MED ORDER — SUGAMMADEX SODIUM 200 MG/2ML IV SOLN
INTRAVENOUS | Status: DC | PRN
  Administered 2022-12-07: 200 mg via INTRAVENOUS

## 2022-12-07 MED ORDER — DEXTROSE 5 % IV SOLN
INTRAVENOUS | Status: DC | PRN
  Administered 2022-12-07: 3 g via INTRAVENOUS

## 2022-12-07 MED ORDER — OXYCODONE HCL 5 MG PO TABS: 5.0000 mg | ORAL_TABLET | ORAL | 0 refills | Status: DC | PRN

## 2022-12-07 MED ORDER — ACETAMINOPHEN 160 MG/5ML PO SOLN: 325.0000 mg | ORAL | Status: DC | PRN

## 2022-12-07 MED ORDER — FENTANYL CITRATE (PF) 100 MCG/2ML IJ SOLN
25.0000 ug | INTRAMUSCULAR | Status: DC | PRN
  Administered 2022-12-07 (×3): 50 ug via INTRAVENOUS

## 2022-12-07 MED ORDER — MEPERIDINE HCL 25 MG/ML IJ SOLN: 6.2500 mg | INTRAMUSCULAR | Status: DC | PRN

## 2022-12-07 MED ORDER — HYDROMORPHONE HCL 1 MG/ML IJ SOLN
INTRAMUSCULAR | Status: AC
  Filled 2022-12-07: qty 0.5

## 2022-12-07 MED ORDER — MIDAZOLAM HCL 2 MG/2ML IJ SOLN
INTRAMUSCULAR | Status: DC | PRN
  Administered 2022-12-07: 2 mg via INTRAVENOUS

## 2022-12-07 MED ORDER — ONDANSETRON HCL 4 MG/2ML IJ SOLN
INTRAMUSCULAR | Status: DC | PRN
  Administered 2022-12-07: 4 mg via INTRAVENOUS

## 2022-12-07 MED ORDER — FENTANYL CITRATE (PF) 250 MCG/5ML IJ SOLN
INTRAMUSCULAR | Status: DC | PRN
  Administered 2022-12-07 (×3): 50 ug via INTRAVENOUS
  Administered 2022-12-07: 100 ug via INTRAVENOUS

## 2022-12-07 MED ORDER — LIDOCAINE 2% (20 MG/ML) 5 ML SYRINGE
INTRAMUSCULAR | Status: AC
  Filled 2022-12-07: qty 5

## 2022-12-07 MED ORDER — DOCUSATE SODIUM 100 MG PO CAPS: 100.0000 mg | ORAL_CAPSULE | Freq: Two times a day (BID) | ORAL | 2 refills | Status: DC

## 2022-12-07 MED ORDER — OXYCODONE HCL 5 MG PO TABS
5.0000 mg | ORAL_TABLET | Freq: Once | ORAL | Status: AC | PRN
  Administered 2022-12-07: 5 mg via ORAL

## 2022-12-07 MED ORDER — DEXAMETHASONE SODIUM PHOSPHATE 10 MG/ML IJ SOLN
INTRAMUSCULAR | Status: DC | PRN
  Administered 2022-12-07: 5 mg via INTRAVENOUS

## 2022-12-07 MED ORDER — FENTANYL CITRATE (PF) 100 MCG/2ML IJ SOLN
INTRAMUSCULAR | Status: AC
  Filled 2022-12-07: qty 2

## 2022-12-07 MED ORDER — OXYCODONE HCL 5 MG/5ML PO SOLN: 5.0000 mg | Freq: Once | ORAL | Status: AC | PRN

## 2022-12-07 MED ORDER — LIDOCAINE 2% (20 MG/ML) 5 ML SYRINGE
INTRAMUSCULAR | Status: DC | PRN
  Administered 2022-12-07: 100 mg via INTRAVENOUS

## 2022-12-07 MED ORDER — DEXAMETHASONE SODIUM PHOSPHATE 10 MG/ML IJ SOLN
INTRAMUSCULAR | Status: AC
  Filled 2022-12-07: qty 1

## 2022-12-07 MED ORDER — ROCURONIUM BROMIDE 10 MG/ML (PF) SYRINGE
PREFILLED_SYRINGE | INTRAVENOUS | Status: DC | PRN
  Administered 2022-12-07: 60 mg via INTRAVENOUS
  Administered 2022-12-07: 20 mg via INTRAVENOUS

## 2022-12-07 MED ORDER — BUPIVACAINE LIPOSOME 1.3 % IJ SUSP
INTRAMUSCULAR | Status: DC | PRN
  Administered 2022-12-07: 40 mL via SURGICAL_CAVITY

## 2022-12-07 MED ORDER — ROCURONIUM BROMIDE 10 MG/ML (PF) SYRINGE
PREFILLED_SYRINGE | INTRAVENOUS | Status: AC
  Filled 2022-12-07: qty 10

## 2022-12-07 MED ORDER — MIDAZOLAM HCL 2 MG/2ML IJ SOLN
INTRAMUSCULAR | Status: AC
  Filled 2022-12-07: qty 2

## 2022-12-07 MED ORDER — ACETAMINOPHEN 325 MG PO TABS: 325.0000 mg | ORAL_TABLET | ORAL | Status: DC | PRN

## 2022-12-07 MED ORDER — DEXMEDETOMIDINE HCL IN NACL 80 MCG/20ML IV SOLN
INTRAVENOUS | Status: DC | PRN
  Administered 2022-12-07: 10 ug via BUCCAL

## 2022-12-07 MED ORDER — LACTATED RINGERS IV SOLN: INTRAVENOUS | Status: DC

## 2022-12-07 MED ORDER — PROPOFOL 10 MG/ML IV BOLUS
INTRAVENOUS | Status: AC
  Filled 2022-12-07: qty 20

## 2022-12-07 MED ORDER — FENTANYL CITRATE (PF) 250 MCG/5ML IJ SOLN
INTRAMUSCULAR | Status: AC
  Filled 2022-12-07: qty 5

## 2022-12-07 MED ORDER — METHOCARBAMOL 750 MG PO TABS: 750.0000 mg | ORAL_TABLET | Freq: Four times a day (QID) | ORAL | 1 refills | Status: DC

## 2022-12-07 MED ORDER — BUPIVACAINE LIPOSOME 1.3 % IJ SUSP
INTRAMUSCULAR | Status: AC
  Filled 2022-12-07: qty 20

## 2022-12-07 MED ORDER — OXYCODONE HCL 5 MG PO TABS
ORAL_TABLET | ORAL | Status: AC
  Filled 2022-12-07: qty 1

## 2022-12-07 MED ORDER — BUPIVACAINE HCL (PF) 0.25 % IJ SOLN
INTRAMUSCULAR | Status: AC
  Filled 2022-12-07: qty 30

## 2022-12-07 MED ORDER — ONDANSETRON HCL 4 MG/2ML IJ SOLN: 4.0000 mg | Freq: Once | INTRAMUSCULAR | Status: DC | PRN

## 2022-12-07 MED ORDER — HYDROMORPHONE HCL 1 MG/ML IJ SOLN
INTRAMUSCULAR | Status: DC | PRN
  Administered 2022-12-07: .5 mg via INTRAVENOUS

## 2022-12-07 MED ORDER — ORAL CARE MOUTH RINSE: 15.0000 mL | Freq: Once | OROMUCOSAL | Status: AC

## 2022-12-07 MED ORDER — CHLORHEXIDINE GLUCONATE 0.12 % MT SOLN
15.0000 mL | Freq: Once | OROMUCOSAL | Status: AC
  Administered 2022-12-07: 15 mL via OROMUCOSAL

## 2022-12-07 MED ORDER — ONDANSETRON HCL 4 MG/2ML IJ SOLN
INTRAMUSCULAR | Status: AC
  Filled 2022-12-07: qty 2

## 2022-12-07 MED ORDER — PROPOFOL 10 MG/ML IV BOLUS
INTRAVENOUS | Status: DC | PRN
  Administered 2022-12-07: 200 mg via INTRAVENOUS

## 2022-12-07 MED ORDER — ACETAMINOPHEN 500 MG PO TABS: 1000.0000 mg | ORAL_TABLET | Freq: Four times a day (QID) | ORAL | 3 refills | Status: AC

## 2022-12-07 SURGICAL SUPPLY — 40 items
APPLIER CLIP 5 13 M/L LIGAMAX5 (MISCELLANEOUS) ×1
BLADE CLIPPER SURG (BLADE) IMPLANT
CANISTER SUCT 3000ML PPV (MISCELLANEOUS) ×1 IMPLANT
CHLORAPREP W/TINT 26 (MISCELLANEOUS) ×1 IMPLANT
CLIP APPLIE 5 13 M/L LIGAMAX5 (MISCELLANEOUS) ×1 IMPLANT
COVER SURGICAL LIGHT HANDLE (MISCELLANEOUS) ×1 IMPLANT
DERMABOND ADVANCED .7 DNX12 (GAUZE/BANDAGES/DRESSINGS) ×1 IMPLANT
DERMABOND ADVANCED .7 DNX6 (GAUZE/BANDAGES/DRESSINGS) IMPLANT
DISSECTOR BLUNT TIP ENDO 5MM (MISCELLANEOUS) IMPLANT
ELECT CAUTERY BLADE 6.4 (BLADE) ×1 IMPLANT
ELECT REM PT RETURN 9FT ADLT (ELECTROSURGICAL) ×1
ELECTRODE REM PT RTRN 9FT ADLT (ELECTROSURGICAL) ×1 IMPLANT
GLOVE BIO SURGEON STRL SZ 6.5 (GLOVE) ×1 IMPLANT
GLOVE BIOGEL PI IND STRL 6 (GLOVE) ×1 IMPLANT
GOWN STRL REUS W/ TWL LRG LVL3 (GOWN DISPOSABLE) ×3 IMPLANT
GOWN STRL REUS W/TWL LRG LVL3 (GOWN DISPOSABLE) ×3
GRASPER SUT TROCAR 14GX15 (MISCELLANEOUS) IMPLANT
KIT BASIN OR (CUSTOM PROCEDURE TRAY) ×1 IMPLANT
KIT TURNOVER KIT B (KITS) ×1 IMPLANT
NDL INSUFFLATION 14GA 120MM (NEEDLE) IMPLANT
NEEDLE INSUFFLATION 14GA 120MM (NEEDLE) ×1 IMPLANT
NS IRRIG 1000ML POUR BTL (IV SOLUTION) ×1 IMPLANT
PAD ARMBOARD 7.5X6 YLW CONV (MISCELLANEOUS) ×1 IMPLANT
PENCIL BUTTON HOLSTER BLD 10FT (ELECTRODE) ×1 IMPLANT
POUCH RETRIEVAL ECOSAC 10 (ENDOMECHANICALS) ×1 IMPLANT
POUCH RETRIEVAL ECOSAC 10MM (ENDOMECHANICALS) ×1
SCISSORS LAP 5X35 DISP (ENDOMECHANICALS) ×1 IMPLANT
SET IRRIG TUBING LAPAROSCOPIC (IRRIGATION / IRRIGATOR) IMPLANT
SET TUBE SMOKE EVAC HIGH FLOW (TUBING) ×1 IMPLANT
SLEEVE Z-THREAD 5X100MM (TROCAR) ×2 IMPLANT
SUT MNCRL AB 4-0 PS2 18 (SUTURE) ×1 IMPLANT
SUT VIC AB 0 UR5 27 (SUTURE) IMPLANT
SUT VICRYL 0 AB UR-6 (SUTURE) IMPLANT
TOWEL GREEN STERILE FF (TOWEL DISPOSABLE) ×1 IMPLANT
TRAY LAPAROSCOPIC MC (CUSTOM PROCEDURE TRAY) ×1 IMPLANT
TROCAR BALLN 12MMX100 BLUNT (TROCAR) ×1 IMPLANT
TROCAR Z THREAD OPTICAL 12X100 (TROCAR) IMPLANT
TROCAR Z-THREAD OPTICAL 5X100M (TROCAR) ×1 IMPLANT
WARMER LAPAROSCOPE (MISCELLANEOUS) ×1 IMPLANT
WATER STERILE IRR 1000ML POUR (IV SOLUTION) ×1 IMPLANT

## 2022-12-07 NOTE — Anesthesia Preprocedure Evaluation (Addendum)
Anesthesia Evaluation  Patient identified by MRN, date of birth, ID band Patient awake    Reviewed: Allergy & Precautions, H&P , NPO status , Patient's Chart, lab work & pertinent test results  History of Anesthesia Complications (+) Family history of anesthesia reaction  Airway Mallampati: I  TM Distance: >3 FB Neck ROM: Full    Dental no notable dental hx. (+) Teeth Intact, Dental Advisory Given   Pulmonary sleep apnea    Pulmonary exam normal breath sounds clear to auscultation       Cardiovascular negative cardio ROS Normal cardiovascular exam+ Valvular Problems/Murmurs  Rhythm:Regular Rate:Normal     Neuro/Psych  PSYCHIATRIC DISORDERS  Depression    negative neurological ROS     GI/Hepatic Neg liver ROS,GERD  ,,S/P Roux en Y Gastric Bypass 2001   Endo/Other  Hypothyroidism  Morbid obesityRight Ovarian Cyst  Renal/GU negative Renal ROS  negative genitourinary   Musculoskeletal   Abdominal  (+) + obese  Peds  Hematology  (+) Blood dyscrasia, anemia Hx/o Blood transfusion 2004   Anesthesia Other Findings   Reproductive/Obstetrics Chronic pelvic pain                             Anesthesia Physical Anesthesia Plan  ASA: 3  Anesthesia Plan: General   Post-op Pain Management: Ofirmev IV (intra-op)*   Induction: Intravenous  PONV Risk Score and Plan: 3 and Ondansetron and Dexamethasone  Airway Management Planned: Oral ETT  Additional Equipment: None  Intra-op Plan:   Post-operative Plan: Extubation in OR  Informed Consent: I have reviewed the patients History and Physical, chart, labs and discussed the procedure including the risks, benefits and alternatives for the proposed anesthesia with the patient or authorized representative who has indicated his/her understanding and acceptance.     Dental advisory given  Plan Discussed with: CRNA, Anesthesiologist and  Surgeon  Anesthesia Plan Comments:         Anesthesia Quick Evaluation

## 2022-12-07 NOTE — Anesthesia Postprocedure Evaluation (Signed)
Anesthesia Post Note  Patient: Sheri [name]  Procedure(s) Performed: LAPAROSCOPIC CHOLECYSTECTOMY     Patient location during evaluation: PACU Anesthesia Type: General Level of consciousness: awake and alert Pain management: pain level controlled Vital Signs Assessment: post-procedure vital signs reviewed and stable Respiratory status: spontaneous breathing, nonlabored ventilation, respiratory function stable and patient connected to nasal cannula oxygen Cardiovascular status: blood pressure returned to baseline and stable Postop Assessment: no apparent nausea or vomiting Anesthetic complications: no  No notable events documented.  Last Vitals:  Vitals:   12/07/22 1232 12/07/22 1615  BP: 126/75 (!) 142/80  Pulse: 78 87  Resp: 18 19  Temp: (!) 36.4 C 37.2 C  SpO2: 95% 99%    Last Pain:  Vitals:   12/07/22 1615  TempSrc:   PainSc: 8                  Anmol Paschen

## 2022-12-07 NOTE — H&P (Signed)
Sheri [name] is an 47 y.o. female.   HPI: 1F with recent acute cholecystitis on imaging. Plan for lap chole. The patient has had no hospitalizations, doctors visits, ER visits, surgeries, or newly diagnosed allergies since being seen in the office.    Past Medical History:  Diagnosis Date   Allergy    seasonal allergies   Anemia    and low B12-on meds   Family history of adverse reaction to anesthesia    GERD (gastroesophageal reflux disease)    with certain foods/due to gastric bypass   Heart murmur    as child - no problems as adult   History of blood transfusion 2004   [facility], after surgery   Hypothyroidism    history=resolved   Inflammation of joint of knee    right knee-on anti-inflammatory, arthritis   Seasonal allergies    Sleep apnea    Vitamin B 12 deficiency     Past Surgical History:  Procedure Laterality Date   ABDOMINOPLASTY  2004   BREAST REDUCTION SURGERY  2005   COLONOSCOPY  2023   GASTRIC BYPASS  2001   Dr Duwayne Heck, in [place] Right 05/17/2014   Procedure: LAPAROSCOPIC  partial  right salpingo-oohorectomy  and cystoscopy;  Surgeon: Lyman Speller, MD;  Location: [facility];  Service: Gynecology;  Laterality: Right;   REDUCTION MAMMAPLASTY Bilateral 2004   WISDOM TOOTH EXTRACTION  2014   upper right    Family History  Problem Relation Age of Onset   Stroke Sister        stroke after cancer diagnosis around age 65   Diabetes Sister    Hypertension Sister    Breast cancer Sister 3   Diabetes Sister    Hypertension Sister    Hypertension Brother    Diabetes Brother    Heart disease Brother    Diabetes Brother    Hypertension Brother    Heart disease Brother    Diabetes Brother    Hypertension Brother    Heart disease Brother    CVA Maternal Grandmother    Colon polyps Neg Hx    Colon cancer Neg Hx    Esophageal cancer Neg Hx    Rectal cancer Neg Hx    Stomach cancer Neg Hx      Social History:  reports that she has never smoked. She has never used smokeless tobacco. She reports that she does not drink alcohol and does not use drugs.  Allergies:  Allergies  Allergen Reactions   Aspirin Other (See Comments)    Pt. Has had gastric bypass surgery.  ASA causes ulcers.     Medications: I have reviewed the patient's current medications.  No results found for this or any previous visit (from the past 48 hour(s)).  No results found.  ROS 10 point review of systems is negative except as listed above in HPI.   Physical Exam Blood pressure 126/75, pulse 78, temperature (!) 97.5 F (36.4 C), temperature source Oral, resp. rate 18, height 5' 5.5" (1.664 m), weight 118.8 kg, SpO2 95 %. Constitutional: well-developed, well-nourished HEENT: pupils equal, round, reactive to light, 51m b/l, moist conjunctiva, external inspection of ears and nose normal, hearing intact Oropharynx: normal oropharyngeal mucosa, normal dentition Neck: no thyromegaly, trachea midline, no midline cervical tenderness to palpation Chest: breath sounds equal bilaterally, normal respiratory effort, no midline or lateral chest wall tenderness to palpation/deformity Abdomen: soft, NT, no bruising, no hepatosplenomegaly GU: normal  female genitalia  Back: no wounds, no thoracic/lumbar spine tenderness to palpation, no thoracic/lumbar spine stepoffs Rectal: good tone, no blood Extremities: 2+ radial and pedal pulses bilaterally, intact motor and sensation bilateral UE and LE, no peripheral edema MSK: normal gait/station, no clubbing/cyanosis of fingers/toes, normal ROM of all four extremities Skin: warm, dry, no rashes Psych: normal memory, normal mood/affect     Assessment/Plan: 34F with recent acute cholecystitis. Plan for lap chole. Informed consent was obtained after detailed explanation of risks, including bleeding, infection, biloma, hematoma, injury to common bile duct, need for IOC to  delineate anatomy, and need for conversion to open procedure. All questions answered to the patient's satisfaction.   Jesusita Oka, MD General and [facility]

## 2022-12-07 NOTE — Op Note (Signed)
   Operative Note  Date: 12/07/2022  Procedure: laparoscopic cholecystectomy  Pre-op diagnosis: symptomatic cholelithiasis, recent acute cholecystitis Post-op diagnosis: same  Indication and clinical history: The patient is a 47 y.o. year old female with symptomatic cholelithiasis, recent acute cholecystitis  Surgeon: Jesusita Oka, MD  Anesthesiologist: Luane School, MD Anesthesia: General  Findings:  Specimen: gallbladder EBL: <5cc Drains/Implants: none  Disposition: PACU - hemodynamically stable.  Description of procedure: The patient was positioned supine on the operating room table. Time-out was performed verifying correct patient, procedure, signature of informed consent, and administration of pre-operative antibiotics. General anesthetic induction and intubation were uneventful. The abdomen was prepped and draped in the usual sterile fashion. The abdominal was insufflated using a Veress needle in the left upper quadrant, followed by a 24m port. An infra-umbilical incision was made under direct visualization and a 1276mport inserted. The abdominal cavity was inspected and no injury of any intra-abdominal structures was identified. Additional ports were placed under direct visualization and using local anesthetic: two 76m66morts in the right subcostal region. The patient was re-positioned to reverse Trendelenburg and right side up. The gallbladder was then retracted cephalad. The infundibulum was identified and retracted toward the right lower quadrant. The peritoneum was incised over the infundibulum and the triangle of Calot dissected to expose the critical view of safety. With clear identification and isolation of the cystic duct, cystic duct artery, and cystic artery, each was doubly clipped and divided. The gallbladder was dissected off the liver bed using electrocautery and hemostasis of the liver bed was confirmed prior to separation of the final peritoneal attachments of the  gallbladder to the liver bed. After transection of the final peritoneal attachments, the gallbladder was placed in an endoscopic specimen retrieval bag, removed via the umbilical port site, and sent to pathology as a permanent specimen. The gallbladder fossa was inspected confirming hemostasis, the absence of bile leakage from the cystic duct stump, and correct placement of clips on the arteries and cystic duct stumps. The abdomen was desufflated and the fascia of the umbilical port site was closed using a PMI under direct visualization. Additional local anesthetic was administered at the all four port sites.  The skin of all incisions was closed with 4-0 monocryl. Sterile dressings were applied. All sponge and instrument counts were correct at the conclusion of the procedure. The patient was awakened from anesthesia, extubated uneventfully, and transported to the PACU - hemodynamically stable.. There were no complications.     AyeJesusita OkaD General and [facility]

## 2022-12-07 NOTE — Anesthesia Procedure Notes (Signed)
Procedure Name: Intubation Date/Time: 12/07/2022 2:48 PM  Performed by: Elvin So, CRNAPre-anesthesia Checklist: Patient identified, Emergency Drugs available, Suction available and Patient being monitored Patient Re-evaluated:Patient Re-evaluated prior to induction Oxygen Delivery Method: Circle System Utilized Preoxygenation: Pre-oxygenation with 100% oxygen Induction Type: IV induction Ventilation: Mask ventilation without difficulty Laryngoscope Size: Mac and 3 Grade View: Grade I Tube type: Oral Tube size: 7.0 mm Number of attempts: 1 Airway Equipment and Method: Stylet and Oral airway Placement Confirmation: ETT inserted through vocal cords under direct vision, positive ETCO2 and breath sounds checked- equal and bilateral Secured at: 22 cm Tube secured with: Tape Dental Injury: Teeth and Oropharynx as per pre-operative assessment

## 2022-12-07 NOTE — Transfer of Care (Signed)
Immediate Anesthesia Transfer of Care Note  Patient: Sheri [name]  Procedure(s) Performed: LAPAROSCOPIC CHOLECYSTECTOMY  Patient Location: PACU  Anesthesia Type:General  Level of Consciousness: sedated and drowsy  Airway & Oxygen Therapy: Patient Spontanous Breathing and Patient connected to face mask oxygen  Post-op Assessment: Report given to RN, Post -op Vital signs reviewed and stable, and Patient moving all extremities  Post vital signs: Reviewed and stable  Last Vitals:  Vitals Value Taken Time  BP 142/80 12/07/22 1616  Temp    Pulse 91 12/07/22 1617  Resp 8 12/07/22 1617  SpO2 99 % 12/07/22 1617  Vitals shown include unvalidated device data.  Last Pain:  Vitals:   12/07/22 1247  TempSrc:   PainSc: 6          Complications: No notable events documented.

## 2022-12-07 NOTE — Discharge Instructions (Signed)
[facility]  [facility]AROSCOPIC SURGERY: POST OP INSTRUCTIONS Always review your discharge instruction sheet given to you by the facility where your surgery was performed. IF YOU HAVE DISABILITY OR FAMILY LEAVE FORMS, YOU MUST BRING THEM TO THE OFFICE FOR PROCESSING.   DO NOT GIVE THEM TO YOUR DOCTOR.  PAIN CONTROL  Pain regimen: take over-the-counter tylenol (acetaminophen) '1000mg'$  every six hours, the prescription ibuprofen ('600mg'$ ) every six hours and the robaxin (methocarbamol) '750mg'$  every six hours. With all three of these, you should be taking something every two hours. Example: tylenol ( acetaminophen) at 8am, ibuprofen at 10am, robaxin (methocarbamol) at 12pm, tylenol (acetaminophen) again at 2pm, ibuprofen again at 4pm, robaxin (methocarbamol) at 6pm. You also have a prescription for oxycodone, which should be taken if the tylenol (acetaminophen), ibuprofen, and robaxin (methocarbamol) are not enough to control your pain. You may take the oxycodone as frequently as every four hours as needed, but if you are taking the other medications as above, you should not need the oxycodone this frequently. You have also been given a prescription for colace (docusate) which is a stool softener. Please take this as prescribed because the oxycodone can cause constipation and the colace (docusate) will minimize or prevent constipation. Do not drive while taking or under the influence of the oxycodone as it is a narcotic medication. Use ice packs to help control pain. If you need a refill on your pain medication, please contact your pharmacy.  They will contact our office to request authorization. Prescriptions will not be filled after 5pm or on week-ends.  HOME MEDICATIONS Take your usually prescribed medications unless otherwise directed.  DIET You should follow a light diet the first few days after arrival home.  Be sure to include lots of fluids daily.   CONSTIPATION It is common to  experience some constipation after surgery and if you are taking pain medication.  Increasing fluid intake and taking a stool softener (such as Colace) will usually help or prevent this problem from occurring.  A mild laxative (Milk of Magnesia or Miralax) should be taken according to package instructions if there are no bowel movements after 48 hours.  WOUND/INCISION CARE Most patients will experience some swelling and bruising in the area of the incisions.  Ice packs will help.  Swelling and bruising can take several days to resolve.  May shower beginning 12/08/2022.  Do not peel off or scrub skin glue. May allow warm soapy water to run over incision, then rinse and pat dry.  Do not soak in any water (tubs, hot tubs, pools, lakes, oceans) for one week.   ACTIVITIES You may resume regular (light) daily activities beginning the next day--such as daily self-care, walking, climbing stairs--gradually increasing activities as tolerated.  You may have sexual intercourse when it is comfortable.   No lifting greater than 5 pounds for six weeks.  You may drive when you are no longer taking narcotic pain medication, you can comfortably wear a seatbelt, and you can safely maneuver your car and apply brakes.  FOLLOW-UP You should see your doctor in the office for a follow-up appointment approximately 2-3 weeks after your surgery.  You should have been given your post-op/follow-up appointment when your surgery was scheduled.  If you did not receive a post-op/follow-up appointment, make sure that you call for this appointment within a day or two after you arrive home to insure a convenient appointment time.  WHEN TO CALL YOUR DOCTOR: Fever over 101.5 Inability to urinate  Continued bleeding from incision. Increased pain, redness, or drainage from the incision. Increasing abdominal pain  The clinic staff is available to answer your questions during regular business hours.  Please don't hesitate to call and  ask to speak to one of the nurses for clinical concerns.  If you have a medical emergency, go to the nearest emergency room or call 911.  A surgeon from [facility] is always on call at the hospital. [address] [place], [place], [place]  [address] ? [address], [place], [place]   [address] [phone] ? [phone] ? FAX [phone] Web site: [email or web address]

## 2022-12-08 ENCOUNTER — Encounter (HOSPITAL_COMMUNITY): Payer: Self-pay | Admitting: Surgery

## 2022-12-12 LAB — SURGICAL PATHOLOGY

## 2023-03-25 ENCOUNTER — Inpatient Hospital Stay: Payer: No Typology Code available for payment source | Attending: Family

## 2023-03-25 ENCOUNTER — Encounter: Payer: Self-pay | Admitting: Family

## 2023-03-25 ENCOUNTER — Inpatient Hospital Stay (HOSPITAL_BASED_OUTPATIENT_CLINIC_OR_DEPARTMENT_OTHER): Payer: No Typology Code available for payment source | Admitting: Family

## 2023-03-25 VITALS — BP 119/66 | HR 62 | Temp 98.9°F | Resp 18 | Wt 243.0 lb

## 2023-03-25 DIAGNOSIS — D510 Vitamin B12 deficiency anemia due to intrinsic factor deficiency: Secondary | ICD-10-CM

## 2023-03-25 DIAGNOSIS — E559 Vitamin D deficiency, unspecified: Secondary | ICD-10-CM

## 2023-03-25 DIAGNOSIS — D508 Other iron deficiency anemias: Secondary | ICD-10-CM

## 2023-03-25 DIAGNOSIS — Z9884 Bariatric surgery status: Secondary | ICD-10-CM | POA: Insufficient documentation

## 2023-03-25 DIAGNOSIS — K912 Postsurgical malabsorption, not elsewhere classified: Secondary | ICD-10-CM | POA: Diagnosis present

## 2023-03-25 LAB — RETICULOCYTES
Immature Retic Fract: 5.5 % (ref 2.3–15.9)
RBC.: 4.29 MIL/uL (ref 3.87–5.11)
Retic Count, Absolute: 59.2 10*3/uL (ref 19.0–186.0)
Retic Ct Pct: 1.4 % (ref 0.4–3.1)

## 2023-03-25 LAB — IRON AND IRON BINDING CAPACITY (CC-WL,HP ONLY)
Iron: 96 ug/dL (ref 28–170)
Saturation Ratios: 25 % (ref 10.4–31.8)
TIBC: 382 ug/dL (ref 250–450)
UIBC: 286 ug/dL (ref 148–442)

## 2023-03-25 LAB — CBC WITH DIFFERENTIAL (CANCER CENTER ONLY)
Abs Immature Granulocytes: 0.01 10*3/uL (ref 0.00–0.07)
Basophils Absolute: 0.1 10*3/uL (ref 0.0–0.1)
Basophils Relative: 1 %
Eosinophils Absolute: 0.1 10*3/uL (ref 0.0–0.5)
Eosinophils Relative: 2 %
HCT: 38.5 % (ref 36.0–46.0)
Hemoglobin: 12.2 g/dL (ref 12.0–15.0)
Immature Granulocytes: 0 %
Lymphocytes Relative: 34 %
Lymphs Abs: 2 10*3/uL (ref 0.7–4.0)
MCH: 28.3 pg (ref 26.0–34.0)
MCHC: 31.7 g/dL (ref 30.0–36.0)
MCV: 89.3 fL (ref 80.0–100.0)
Monocytes Absolute: 0.6 10*3/uL (ref 0.1–1.0)
Monocytes Relative: 11 %
Neutro Abs: 3 10*3/uL (ref 1.7–7.7)
Neutrophils Relative %: 52 %
Platelet Count: 285 10*3/uL (ref 150–400)
RBC: 4.31 MIL/uL (ref 3.87–5.11)
RDW: 13.6 % (ref 11.5–15.5)
WBC Count: 5.8 10*3/uL (ref 4.0–10.5)
nRBC: 0 % (ref 0.0–0.2)

## 2023-03-25 LAB — VITAMIN D 25 HYDROXY (VIT D DEFICIENCY, FRACTURES): Vit D, 25-Hydroxy: 52.17 ng/mL (ref 30–100)

## 2023-03-25 LAB — FERRITIN: Ferritin: 28 ng/mL (ref 11–307)

## 2023-03-25 LAB — VITAMIN B12: Vitamin B-12: 182 pg/mL (ref 180–914)

## 2023-03-25 NOTE — Progress Notes (Signed)
Hematology and Oncology Follow Up Visit  Sheri [name] [id number] 08-14-1976 47 y.o. 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 She is symptomatic with fatigue, weakness, dizziness with fall, chills, ice cravings and insomnia.  Thankfully she has not been seriously injured with her falls. No syncope.  She has not noted any blood loss. No bruising or petechiae.  No fever, n/v, cough, rash, chest pain, abdominal pain or changes in bowel or bladder habits.  No swelling in her extremities.  Numbness and tingling in her hands and feet come and go.  She is currently on [employer].  She does not have much of an appetite and is doing her best to stay well hydrated. Her weight is 243 lbs.   ECOG Performance Status: 1 - Symptomatic but completely ambulatory  Medications:  Allergies as of 03/25/2023       Reactions   Aspirin Other (See Comments)   Pt. Has had gastric bypass surgery.  ASA causes ulcers.         Medication List        Accurate as of Mar 25, 2023  2:32 PM. If you have any questions, ask your nurse or doctor.          acetaminophen 500 MG tablet Commonly known as: TYLENOL Take 2 tablets (1,000 mg total) by mouth 4 (four) times daily.   B-D 3CC LUER-LOK SYR 23GX1-1/2 23G X 1-1/2" 3 ML Misc Generic drug: SYRINGE-NEEDLE (DISP) 3 ML SMARTSIG:Injection Once a Month   cyanocobalamin 1000 MCG/ML injection Commonly known as: VITAMIN B12 INJECT 1 ML (1,000 MCG TOTAL) INTO THE MUSCLE EVERY 30 DAYS.   docusate sodium 100 MG capsule Commonly known as: Colace Take 1 capsule (100 mg total) by mouth 2 (two) times daily.   escitalopram 10 MG tablet Commonly known as: Lexapro Take 1.5 tablets (15 mg total) by mouth daily.    levocetirizine 2.5 MG/5ML solution Commonly known as: XYZAL Take 2.5 mg by mouth every evening.   methocarbamol 750 MG tablet Commonly known as: Robaxin-750 Take 1 tablet (750 mg total) by mouth 4 (four) times daily.   montelukast 10 MG tablet Commonly known as: SINGULAIR Take 1 tablet (10 mg total) by mouth at bedtime.   Norethindrone-Ethinyl Estradiol-Fe 0.8-25 MG-MCG tablet Commonly known as: Kaitlib Fe CHEW 1 TABLET DAILY. (TAKESCONTINUOUS ACTIVE ORAL CONTRACEPTIVE PILLS.)   oxyCODONE 5 MG immediate release tablet Commonly known as: Roxicodone Take 1 tablet (5 mg total) by mouth every 4 (four) hours as needed for severe pain.   Vitamin D (Ergocalciferol) 1.25 MG (50000 UNIT) Caps capsule Commonly known as: DRISDOL TAKE 1 CAPSULE BY MOUTH ONE TIME PER WEEK   Zepbound 5 MG/0.5ML Pen Generic drug: tirzepatide Inject 5 mg into the skin once a week.        Allergies:  Allergies  Allergen Reactions   Aspirin Other (See Comments)    Pt. Has had gastric bypass surgery.  ASA causes ulcers.     Past Medical History, Surgical history, Social history, and Family History were reviewed and updated.  Review of Systems: All other 10 point review of systems is negative.   Physical Exam:  weight is 243 lb (110.2 kg). Her oral temperature is  98.9 F (37.2 C). Her blood pressure is 119/66 and her pulse is 62. Her respiration is 18 and oxygen saturation is 100%.   Wt Readings from Last 3 Encounters:  03/25/23 243 lb (110.2 kg)  12/07/22 262 lb (118.8 kg)  12/01/22 262 lb (118.8 kg)    Ocular: Sclerae unicteric, pupils equal, round and reactive to light Ear-nose-throat: Oropharynx clear, dentition fair Lymphatic: No cervical or supraclavicular adenopathy Lungs no rales or rhonchi, good excursion bilaterally Heart regular rate and rhythm, no murmur appreciated Abd soft, nontender, positive bowel sounds MSK no focal spinal tenderness, no joint edema Neuro: non-focal,  well-oriented, appropriate affect Breasts: Deferred   Lab Results  Component Value Date   WBC 5.8 03/25/2023   HGB 12.2 03/25/2023   HCT 38.5 03/25/2023   MCV 89.3 03/25/2023   PLT 285 03/25/2023   Lab Results  Component Value Date   FERRITIN 33 09/24/2022   IRON 116 09/24/2022   TIBC 399 09/24/2022   UIBC 283 09/24/2022   IRONPCTSAT 29 09/24/2022   Lab Results  Component Value Date   RETICCTPCT 1.4 03/25/2023   RBC 4.29 03/25/2023   RBC 4.31 03/25/2023   No results found for: "KPAFRELGTCHN", "LAMBDASER", "KAPLAMBRATIO" No results found for: "IGGSERUM", "IGA", "IGMSERUM" No results found for: "TOTALPROTELP", "ALBUMINELP", "A1GS", "A2GS", "BETS", "BETA2SER", "GAMS", "MSPIKE", "SPEI"   Chemistry      Component Value Date/Time   NA 138 12/01/2022 2000   NA 144 10/08/2017 0812   NA 140 07/10/2017 0838   K 3.6 12/01/2022 2000   K 3.9 10/08/2017 0812   K 3.6 07/10/2017 0838   CL 108 12/01/2022 2000   CL 109 (H) 10/08/2017 0812   CO2 23 12/01/2022 2000   CO2 25 10/08/2017 0812   CO2 23 07/10/2017 0838   BUN 10 12/01/2022 2000   BUN 9 10/08/2017 0812   BUN 9.0 07/10/2017 0838   CREATININE 0.74 12/01/2022 2000   CREATININE 0.90 03/26/2022 1406   CREATININE 0.8 10/08/2017 0812   CREATININE 0.8 07/10/2017 0838      Component Value Date/Time   CALCIUM 9.5 12/01/2022 2000   CALCIUM 9.1 10/08/2017 0812   CALCIUM 9.2 07/10/2017 0838   ALKPHOS 73 12/01/2022 2000   ALKPHOS 78 10/08/2017 0812   ALKPHOS 127 07/10/2017 0838   AST 22 12/01/2022 2000   AST 20 03/26/2022 1406   AST 22 07/10/2017 0838   ALT 20 12/01/2022 2000   ALT 22 03/26/2022 1406   ALT 23 10/08/2017 0812   ALT 22 07/10/2017 0838   BILITOT 0.3 12/01/2022 2000   BILITOT 0.3 03/26/2022 1406   BILITOT 0.34 07/10/2017 0838       Impression and Plan: Sheri [name] is a very pleasant 47 yo African American female with iron, B-12 and Vit D deficiency 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  B 12,  vitamin D and iron studies pending.  Follow-up in 6 months.   Eileen Stanford, NP 5/13/20242:32 PM

## 2023-03-26 ENCOUNTER — Telehealth: Payer: Self-pay | Admitting: *Deleted

## 2023-03-26 NOTE — Telephone Encounter (Signed)
-----   Message from Erenest Blank, NP sent at 03/26/2023  9:22 AM EDT ----- B 12 is on low end of normal, make sure to do B 12 injection once a month.   ----- Message ----- From: Interface, [facility] [place] Sent: 03/25/2023   1:30 PM EDT To: Erenest Blank, NP

## 2023-07-26 ENCOUNTER — Other Ambulatory Visit: Payer: Self-pay | Admitting: Obstetrics & Gynecology

## 2023-07-26 DIAGNOSIS — Z1231 Encounter for screening mammogram for malignant neoplasm of breast: Secondary | ICD-10-CM

## 2023-08-09 ENCOUNTER — Ambulatory Visit
Admission: RE | Admit: 2023-08-09 | Discharge: 2023-08-09 | Disposition: A | Discharge status: Home / Self Care | Payer: No Typology Code available for payment source | Source: Ambulatory Visit | Attending: Obstetrics & Gynecology | Admitting: Obstetrics & Gynecology

## 2023-08-09 DIAGNOSIS — Z1231 Encounter for screening mammogram for malignant neoplasm of breast: Secondary | ICD-10-CM

## 2023-08-19 ENCOUNTER — Other Ambulatory Visit: Payer: Self-pay

## 2023-08-19 ENCOUNTER — Encounter: Payer: Self-pay | Admitting: Medical Oncology

## 2023-08-19 ENCOUNTER — Inpatient Hospital Stay (HOSPITAL_BASED_OUTPATIENT_CLINIC_OR_DEPARTMENT_OTHER): Payer: No Typology Code available for payment source | Admitting: Medical Oncology

## 2023-08-19 ENCOUNTER — Inpatient Hospital Stay: Payer: No Typology Code available for payment source | Attending: Hematology & Oncology

## 2023-08-19 VITALS — BP 113/73 | HR 70 | Temp 97.9°F | Resp 16 | Ht 65.0 in | Wt 235.0 lb

## 2023-08-19 DIAGNOSIS — D508 Other iron deficiency anemias: Secondary | ICD-10-CM | POA: Insufficient documentation

## 2023-08-19 DIAGNOSIS — D510 Vitamin B12 deficiency anemia due to intrinsic factor deficiency: Secondary | ICD-10-CM | POA: Insufficient documentation

## 2023-08-19 DIAGNOSIS — Z9884 Bariatric surgery status: Secondary | ICD-10-CM | POA: Diagnosis not present

## 2023-08-19 DIAGNOSIS — E559 Vitamin D deficiency, unspecified: Secondary | ICD-10-CM

## 2023-08-19 LAB — CBC WITH DIFFERENTIAL (CANCER CENTER ONLY)
Abs Immature Granulocytes: 0.01 10*3/uL (ref 0.00–0.07)
Basophils Absolute: 0.1 10*3/uL (ref 0.0–0.1)
Basophils Relative: 1 %
Eosinophils Absolute: 0.1 10*3/uL (ref 0.0–0.5)
Eosinophils Relative: 2 %
HCT: 41.1 % (ref 36.0–46.0)
Hemoglobin: 13.1 g/dL (ref 12.0–15.0)
Immature Granulocytes: 0 %
Lymphocytes Relative: 30 %
Lymphs Abs: 1.6 10*3/uL (ref 0.7–4.0)
MCH: 28.2 pg (ref 26.0–34.0)
MCHC: 31.9 g/dL (ref 30.0–36.0)
MCV: 88.6 fL (ref 80.0–100.0)
Monocytes Absolute: 0.5 10*3/uL (ref 0.1–1.0)
Monocytes Relative: 9 %
Neutro Abs: 3.2 10*3/uL (ref 1.7–7.7)
Neutrophils Relative %: 58 %
Platelet Count: 290 10*3/uL (ref 150–400)
RBC: 4.64 MIL/uL (ref 3.87–5.11)
RDW: 13.5 % (ref 11.5–15.5)
WBC Count: 5.5 10*3/uL (ref 4.0–10.5)
nRBC: 0 % (ref 0.0–0.2)

## 2023-08-19 LAB — IRON AND IRON BINDING CAPACITY (CC-WL,HP ONLY)
Iron: 128 ug/dL (ref 28–170)
Saturation Ratios: 30 % (ref 10.4–31.8)
TIBC: 427 ug/dL (ref 250–450)
UIBC: 299 ug/dL (ref 148–442)

## 2023-08-19 LAB — FERRITIN: Ferritin: 18 ng/mL (ref 11–307)

## 2023-08-19 LAB — VITAMIN B12: Vitamin B-12: 192 pg/mL (ref 180–914)

## 2023-08-19 LAB — VITAMIN D 25 HYDROXY (VIT D DEFICIENCY, FRACTURES): Vit D, 25-Hydroxy: 42.96 ng/mL (ref 30–100)

## 2023-08-19 LAB — RETICULOCYTES
Immature Retic Fract: 4.4 % (ref 2.3–15.9)
RBC.: 4.59 MIL/uL (ref 3.87–5.11)
Retic Count, Absolute: 56.5 10*3/uL (ref 19.0–186.0)
Retic Ct Pct: 1.2 % (ref 0.4–3.1)

## 2023-08-19 NOTE — Progress Notes (Signed)
Hematology and Oncology Follow Up Visit  Sheri [name] [id number] [birth year] 47 y.o. 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   Symptoms include fatigue, weakness, dizziness with fall, chills, ice cravings and insomnia. These are chronic in nature and stable. She is hoping that her iron is low so that she can get an infusion to hopefully help with symptoms.  She reports that she is completing her B12 injections about every other month due to forgetting Continues oral Vitamin D supplementation.  No syncope.  She has not noted any blood loss. No bruising or petechiae.  No fever, n/v, cough, rash, chest pain, abdominal pain or changes in bowel or bladder habits.  No swelling in her extremities.  Numbness and tingling in her hands and feet come and go.  She is currently on [facility]. Weight loss is intentional   Wt Readings from Last 3 Encounters:  03/25/23 243 lb (110.2 kg)  12/07/22 262 lb (118.8 kg)  12/01/22 262 lb (118.8 kg)   ECOG Performance Status: 1 - Symptomatic but completely ambulatory  Medications:  Allergies as of 08/19/2023       Reactions   Aspirin Other (See Comments)   Pt. Has had gastric bypass surgery.  ASA causes ulcers.         Medication List        Accurate as of August 19, 2023 10:33 AM. If you have any questions, ask your nurse or doctor.          STOP taking these medications    methocarbamol 750 MG tablet Commonly known as: Robaxin-750 Stopped by: Rushie Chestnut   oxyCODONE 5 MG immediate release tablet Commonly known as: Roxicodone Stopped by: Rushie Chestnut       TAKE these medications    acetaminophen 500 MG tablet Commonly known as:  TYLENOL Take 2 tablets (1,000 mg total) by mouth 4 (four) times daily.   B-D 3CC LUER-LOK SYR 23GX1-1/2 23G X 1-1/2" 3 ML Misc Generic drug: SYRINGE-NEEDLE (DISP) 3 ML SMARTSIG:Injection Once a Month   cyanocobalamin 1000 MCG/ML injection Commonly known as: VITAMIN B12 INJECT 1 ML (1,000 MCG TOTAL) INTO THE MUSCLE EVERY 30 DAYS.   docusate sodium 100 MG capsule Commonly known as: Colace Take 1 capsule (100 mg total) by mouth 2 (two) times daily.   escitalopram 10 MG tablet Commonly known as: Lexapro Take 1.5 tablets (15 mg total) by mouth daily.   levocetirizine 2.5 MG/5ML solution Commonly known as: XYZAL Take 2.5 mg by mouth every evening.   montelukast 10 MG tablet Commonly known as: SINGULAIR Take 1 tablet (10 mg total) by mouth at bedtime.   Norethindrone-Ethinyl Estradiol-Fe 0.8-25 MG-MCG tablet Commonly known as: Kaitlib Fe CHEW 1 TABLET DAILY. (TAKESCONTINUOUS ACTIVE ORAL CONTRACEPTIVE PILLS.)   Vitamin D (Ergocalciferol) 1.25 MG (50000 UNIT) Caps capsule Commonly known as: DRISDOL TAKE 1 CAPSULE BY MOUTH ONE TIME PER WEEK   Zepbound 5 MG/0.5ML Pen Generic drug: tirzepatide Inject 5 mg into the skin once a week.        Allergies:  Allergies  Allergen Reactions   Aspirin Other (See Comments)    Pt. Has had gastric bypass surgery.  ASA causes  ulcers.     Past Medical History, Surgical history, Social history, and Family History were reviewed and updated.  Review of Systems: All other 10 point review of systems is negative.   Physical Exam:  vitals were not taken for this visit.   Wt Readings from Last 3 Encounters:  03/25/23 243 lb (110.2 kg)  12/07/22 262 lb (118.8 kg)  12/01/22 262 lb (118.8 kg)    Ocular: Sclerae unicteric, pupils equal, round and reactive to light Ear-nose-throat: Oropharynx clear, dentition fair Lymphatic: No cervical or supraclavicular adenopathy Lungs no rales or rhonchi, good excursion bilaterally Heart regular rate  and rhythm, no murmur appreciated Abd soft, nontender, positive bowel sounds MSK no focal spinal tenderness, no joint edema Neuro: non-focal, well-oriented, appropriate affect   Lab Results  Component Value Date   WBC 5.5 08/19/2023   HGB 13.1 08/19/2023   HCT 41.1 08/19/2023   MCV 88.6 08/19/2023   PLT 290 08/19/2023   Lab Results  Component Value Date   FERRITIN 28 03/25/2023   IRON 96 03/25/2023   TIBC 382 03/25/2023   UIBC 286 03/25/2023   IRONPCTSAT 25 03/25/2023   Lab Results  Component Value Date   RETICCTPCT 1.2 08/19/2023   RBC 4.59 08/19/2023   No results found for: "KPAFRELGTCHN", "LAMBDASER", "KAPLAMBRATIO" No results found for: "IGGSERUM", "IGA", "IGMSERUM" No results found for: "TOTALPROTELP", "ALBUMINELP", "A1GS", "A2GS", "BETS", "BETA2SER", "GAMS", "MSPIKE", "SPEI"   Chemistry      Component Value Date/Time   NA 138 12/01/2022 2000   NA 144 10/08/2017 0812   NA 140 07/10/2017 0838   K 3.6 12/01/2022 2000   K 3.9 10/08/2017 0812   K 3.6 07/10/2017 0838   CL 108 12/01/2022 2000   CL 109 (H) 10/08/2017 0812   CO2 23 12/01/2022 2000   CO2 25 10/08/2017 0812   CO2 23 07/10/2017 0838   BUN 10 12/01/2022 2000   BUN 9 10/08/2017 0812   BUN 9.0 07/10/2017 0838   CREATININE 0.74 12/01/2022 2000   CREATININE 0.90 03/26/2022 1406   CREATININE 0.8 10/08/2017 0812   CREATININE 0.8 07/10/2017 0838      Component Value Date/Time   CALCIUM 9.5 12/01/2022 2000   CALCIUM 9.1 10/08/2017 0812   CALCIUM 9.2 07/10/2017 0838   ALKPHOS 73 12/01/2022 2000   ALKPHOS 78 10/08/2017 0812   ALKPHOS 127 07/10/2017 0838   AST 22 12/01/2022 2000   AST 20 03/26/2022 1406   AST 22 07/10/2017 0838   ALT 20 12/01/2022 2000   ALT 22 03/26/2022 1406   ALT 23 10/08/2017 0812   ALT 22 07/10/2017 0838   BILITOT 0.3 12/01/2022 2000   BILITOT 0.3 03/26/2022 1406   BILITOT 0.34 07/10/2017 0838      Encounter Diagnoses  Name Primary?   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   Today her Hgb is 13.1. Normal MCV. Normal retic count.  B 12, vitamin D and iron studies pending.  Will schedule for iron replacement if needed.  RTC 6 months APP, labs   Rushie Chestnut, PA-C 10/7/202410:33 AM

## 2023-08-20 ENCOUNTER — Other Ambulatory Visit: Payer: Self-pay | Admitting: Medical Oncology

## 2023-08-22 ENCOUNTER — Telehealth: Payer: Self-pay | Admitting: Hematology & Oncology

## 2023-08-22 NOTE — Telephone Encounter (Signed)
Left vm for pt regarding scheduling 5 doses of IV iron. Provided name and call back number for scheduling.

## 2023-08-27 ENCOUNTER — Inpatient Hospital Stay: Payer: No Typology Code available for payment source

## 2023-08-27 VITALS — BP 111/59 | HR 68 | Temp 98.3°F | Resp 17

## 2023-08-27 DIAGNOSIS — D508 Other iron deficiency anemias: Secondary | ICD-10-CM | POA: Diagnosis not present

## 2023-08-27 MED ORDER — SODIUM CHLORIDE 0.9 % IV SOLN
200.0000 mg | Freq: Once | INTRAVENOUS | Status: AC
  Administered 2023-08-27: 200 mg via INTRAVENOUS
  Filled 2023-08-27: qty 200

## 2023-08-27 MED ORDER — SODIUM CHLORIDE 0.9 % IV SOLN: Freq: Once | INTRAVENOUS | Status: AC

## 2023-09-03 ENCOUNTER — Inpatient Hospital Stay: Payer: No Typology Code available for payment source

## 2023-09-03 VITALS — BP 101/72 | HR 64 | Temp 98.5°F | Resp 18

## 2023-09-03 DIAGNOSIS — D508 Other iron deficiency anemias: Secondary | ICD-10-CM

## 2023-09-03 MED ORDER — IRON SUCROSE 20 MG/ML IV SOLN: 200.0000 mg | Freq: Once | INTRAVENOUS | Status: DC

## 2023-09-03 MED ORDER — IRON SUCROSE 20 MG/ML IV SOLN
200.0000 mg | Freq: Once | INTRAVENOUS | Status: AC
Start: 2023-09-03 — End: 2023-09-03
  Administered 2023-09-03: 200 mg via INTRAVENOUS
  Filled 2023-09-03: qty 10

## 2023-09-03 MED ORDER — SODIUM CHLORIDE 0.9 % IV SOLN: Freq: Once | INTRAVENOUS | Status: AC

## 2023-09-03 NOTE — Patient Instructions (Signed)
Iron Sucrose Injection What is this medication? IRON SUCROSE (EYE ern SOO krose) treats low levels of iron (iron deficiency anemia) in people with kidney disease. Iron is a mineral that plays an important role in making red blood cells, which carry oxygen from your lungs to the rest of your body. This medicine may be used for other purposes; ask your health care provider or pharmacist if you have questions. COMMON BRAND NAME(S): Venofer What should I tell my care team before I take this medication? They need to know if you have any of these conditions: Anemia not caused by low iron levels Heart disease High levels of iron in the blood Kidney disease Liver disease An unusual or allergic reaction to iron, other medications, foods, dyes, or preservatives Pregnant or trying to get pregnant Breastfeeding How should I use this medication? This medication is for infusion into a vein. It is given in a hospital or clinic setting. Talk to your care team about the use of this medication in children. While this medication may be prescribed for children as young as 2 years for selected conditions, precautions do apply. Overdosage: If you think you have taken too much of this medicine contact a poison control center or emergency room at once. NOTE: This medicine is only for you. Do not share this medicine with others. What if I miss a dose? Keep appointments for follow-up doses. It is important not to miss your dose. Call your care team if you are unable to keep an appointment. What may interact with this medication? Do not take this medication with any of the following: Deferoxamine Dimercaprol Other iron products This medication may also interact with the following: Chloramphenicol Deferasirox This list may not describe all possible interactions. Give your health care provider a list of all the medicines, herbs, non-prescription drugs, or dietary supplements you use. Also tell them if you smoke,  drink alcohol, or use illegal drugs. Some items may interact with your medicine. What should I watch for while using this medication? Visit your care team regularly. Tell your care team if your symptoms do not start to get better or if they get worse. You may need blood work done while you are taking this medication. You may need to follow a special diet. Talk to your care team. Foods that contain iron include: whole grains/cereals, dried fruits, beans, or peas, leafy green vegetables, and organ meats (liver, kidney). What side effects may I notice from receiving this medication? Side effects that you should report to your care team as soon as possible: Allergic reactions--skin rash, itching, hives, swelling of the face, lips, tongue, or throat Low blood pressure--dizziness, feeling faint or lightheaded, blurry vision Shortness of breath Side effects that usually do not require medical attention (report to your care team if they continue or are bothersome): Flushing Headache Joint pain Muscle pain Nausea Pain, redness, or irritation at injection site This list may not describe all possible side effects. Call your doctor for medical advice about side effects. You may report side effects to FDA at 1-800-FDA-1088. Where should I keep my medication? This medication is given in a hospital or clinic. It will not be stored at home. NOTE: This sheet is a summary. It may not cover all possible information. If you have questions about this medicine, talk to your doctor, pharmacist, or health care provider.  2024 Elsevier/Gold Standard (2023-04-05 00:00:00)

## 2023-09-10 ENCOUNTER — Inpatient Hospital Stay: Payer: No Typology Code available for payment source

## 2023-09-10 VITALS — BP 113/70 | HR 70 | Temp 98.5°F | Resp 18

## 2023-09-10 DIAGNOSIS — D508 Other iron deficiency anemias: Secondary | ICD-10-CM

## 2023-09-10 MED ORDER — IRON SUCROSE 20 MG/ML IV SOLN
200.0000 mg | Freq: Once | INTRAVENOUS | Status: AC
  Administered 2023-09-10: 200 mg via INTRAVENOUS
  Filled 2023-09-10: qty 10

## 2023-09-10 MED ORDER — SODIUM CHLORIDE 0.9 % IV SOLN: Freq: Once | INTRAVENOUS | Status: AC

## 2023-09-10 MED ORDER — SODIUM CHLORIDE 0.9 % IV SOLN
Freq: Once | INTRAVENOUS | Status: DC
Start: 2023-09-10 — End: 2023-09-10

## 2023-09-10 NOTE — Patient Instructions (Signed)
Iron Sucrose Injection What is this medication? IRON SUCROSE (EYE ern SOO krose) treats low levels of iron (iron deficiency anemia) in people with kidney disease. Iron is a mineral that plays an important role in making red blood cells, which carry oxygen from your lungs to the rest of your body. This medicine may be used for other purposes; ask your health care provider or pharmacist if you have questions. COMMON BRAND NAME(S): Venofer What should I tell my care team before I take this medication? They need to know if you have any of these conditions: Anemia not caused by low iron levels Heart disease High levels of iron in the blood Kidney disease Liver disease An unusual or allergic reaction to iron, other medications, foods, dyes, or preservatives Pregnant or trying to get pregnant Breastfeeding How should I use this medication? This medication is for infusion into a vein. It is given in a hospital or clinic setting. Talk to your care team about the use of this medication in children. While this medication may be prescribed for children as young as 2 years for selected conditions, precautions do apply. Overdosage: If you think you have taken too much of this medicine contact a poison control center or emergency room at once. NOTE: This medicine is only for you. Do not share this medicine with others. What if I miss a dose? Keep appointments for follow-up doses. It is important not to miss your dose. Call your care team if you are unable to keep an appointment. What may interact with this medication? Do not take this medication with any of the following: Deferoxamine Dimercaprol Other iron products This medication may also interact with the following: Chloramphenicol Deferasirox This list may not describe all possible interactions. Give your health care provider a list of all the medicines, herbs, non-prescription drugs, or dietary supplements you use. Also tell them if you smoke,  drink alcohol, or use illegal drugs. Some items may interact with your medicine. What should I watch for while using this medication? Visit your care team regularly. Tell your care team if your symptoms do not start to get better or if they get worse. You may need blood work done while you are taking this medication. You may need to follow a special diet. Talk to your care team. Foods that contain iron include: whole grains/cereals, dried fruits, beans, or peas, leafy green vegetables, and organ meats (liver, kidney). What side effects may I notice from receiving this medication? Side effects that you should report to your care team as soon as possible: Allergic reactions--skin rash, itching, hives, swelling of the face, lips, tongue, or throat Low blood pressure--dizziness, feeling faint or lightheaded, blurry vision Shortness of breath Side effects that usually do not require medical attention (report to your care team if they continue or are bothersome): Flushing Headache Joint pain Muscle pain Nausea Pain, redness, or irritation at injection site This list may not describe all possible side effects. Call your doctor for medical advice about side effects. You may report side effects to FDA at 1-800-FDA-1088. Where should I keep my medication? This medication is given in a hospital or clinic. It will not be stored at home. NOTE: This sheet is a summary. It may not cover all possible information. If you have questions about this medicine, talk to your doctor, pharmacist, or health care provider.  2024 Elsevier/Gold Standard (2023-04-05 00:00:00)

## 2023-09-17 ENCOUNTER — Inpatient Hospital Stay: Payer: No Typology Code available for payment source | Attending: Hematology & Oncology

## 2023-09-17 VITALS — BP 112/64 | HR 71 | Temp 98.6°F | Resp 18

## 2023-09-17 DIAGNOSIS — Z9884 Bariatric surgery status: Secondary | ICD-10-CM | POA: Insufficient documentation

## 2023-09-17 DIAGNOSIS — E559 Vitamin D deficiency, unspecified: Secondary | ICD-10-CM | POA: Diagnosis not present

## 2023-09-17 DIAGNOSIS — D509 Iron deficiency anemia, unspecified: Secondary | ICD-10-CM | POA: Diagnosis present

## 2023-09-17 DIAGNOSIS — K909 Intestinal malabsorption, unspecified: Secondary | ICD-10-CM | POA: Insufficient documentation

## 2023-09-17 DIAGNOSIS — D510 Vitamin B12 deficiency anemia due to intrinsic factor deficiency: Secondary | ICD-10-CM | POA: Diagnosis not present

## 2023-09-17 DIAGNOSIS — D508 Other iron deficiency anemias: Secondary | ICD-10-CM

## 2023-09-17 MED ORDER — SODIUM CHLORIDE 0.9 % IV SOLN: Freq: Once | INTRAVENOUS | Status: AC

## 2023-09-17 MED ORDER — IRON SUCROSE 20 MG/ML IV SOLN
200.0000 mg | Freq: Once | INTRAVENOUS | Status: AC
Start: 2023-09-17 — End: 2023-09-17
  Administered 2023-09-17: 200 mg via INTRAVENOUS
  Filled 2023-09-17: qty 10

## 2023-09-17 NOTE — Patient Instructions (Signed)
Iron Sucrose Injection What is this medication? IRON SUCROSE (EYE ern SOO krose) treats low levels of iron (iron deficiency anemia) in people with kidney disease. Iron is a mineral that plays an important role in making red blood cells, which carry oxygen from your lungs to the rest of your body. This medicine may be used for other purposes; ask your health care provider or pharmacist if you have questions. COMMON BRAND NAME(S): Venofer What should I tell my care team before I take this medication? They need to know if you have any of these conditions: Anemia not caused by low iron levels Heart disease High levels of iron in the blood Kidney disease Liver disease An unusual or allergic reaction to iron, other medications, foods, dyes, or preservatives Pregnant or trying to get pregnant Breastfeeding How should I use this medication? This medication is for infusion into a vein. It is given in a hospital or clinic setting. Talk to your care team about the use of this medication in children. While this medication may be prescribed for children as young as 2 years for selected conditions, precautions do apply. Overdosage: If you think you have taken too much of this medicine contact a poison control center or emergency room at once. NOTE: This medicine is only for you. Do not share this medicine with others. What if I miss a dose? Keep appointments for follow-up doses. It is important not to miss your dose. Call your care team if you are unable to keep an appointment. What may interact with this medication? Do not take this medication with any of the following: Deferoxamine Dimercaprol Other iron products This medication may also interact with the following: Chloramphenicol Deferasirox This list may not describe all possible interactions. Give your health care provider a list of all the medicines, herbs, non-prescription drugs, or dietary supplements you use. Also tell them if you smoke,  drink alcohol, or use illegal drugs. Some items may interact with your medicine. What should I watch for while using this medication? Visit your care team regularly. Tell your care team if your symptoms do not start to get better or if they get worse. You may need blood work done while you are taking this medication. You may need to follow a special diet. Talk to your care team. Foods that contain iron include: whole grains/cereals, dried fruits, beans, or peas, leafy green vegetables, and organ meats (liver, kidney). What side effects may I notice from receiving this medication? Side effects that you should report to your care team as soon as possible: Allergic reactions--skin rash, itching, hives, swelling of the face, lips, tongue, or throat Low blood pressure--dizziness, feeling faint or lightheaded, blurry vision Shortness of breath Side effects that usually do not require medical attention (report to your care team if they continue or are bothersome): Flushing Headache Joint pain Muscle pain Nausea Pain, redness, or irritation at injection site This list may not describe all possible side effects. Call your doctor for medical advice about side effects. You may report side effects to FDA at 1-800-FDA-1088. Where should I keep my medication? This medication is given in a hospital or clinic. It will not be stored at home. NOTE: This sheet is a summary. It may not cover all possible information. If you have questions about this medicine, talk to your doctor, pharmacist, or health care provider.  2024 Elsevier/Gold Standard (2023-04-05 00:00:00)

## 2023-09-24 ENCOUNTER — Inpatient Hospital Stay: Payer: No Typology Code available for payment source

## 2023-09-25 ENCOUNTER — Inpatient Hospital Stay: Payer: No Typology Code available for payment source

## 2023-09-25 ENCOUNTER — Inpatient Hospital Stay (HOSPITAL_BASED_OUTPATIENT_CLINIC_OR_DEPARTMENT_OTHER): Payer: No Typology Code available for payment source | Admitting: Hematology & Oncology

## 2023-09-25 ENCOUNTER — Encounter: Payer: Self-pay | Admitting: Hematology & Oncology

## 2023-09-25 VITALS — BP 113/64 | HR 68

## 2023-09-25 VITALS — BP 119/66 | HR 65 | Temp 98.6°F | Resp 18 | Ht 65.5 in | Wt 231.0 lb

## 2023-09-25 DIAGNOSIS — D500 Iron deficiency anemia secondary to blood loss (chronic): Secondary | ICD-10-CM | POA: Diagnosis not present

## 2023-09-25 DIAGNOSIS — D509 Iron deficiency anemia, unspecified: Secondary | ICD-10-CM | POA: Diagnosis not present

## 2023-09-25 DIAGNOSIS — D508 Other iron deficiency anemias: Secondary | ICD-10-CM

## 2023-09-25 DIAGNOSIS — D510 Vitamin B12 deficiency anemia due to intrinsic factor deficiency: Secondary | ICD-10-CM | POA: Diagnosis not present

## 2023-09-25 LAB — CBC
HCT: 41.8 % (ref 36.0–46.0)
Hemoglobin: 12.9 g/dL (ref 12.0–15.0)
MCH: 28.4 pg (ref 26.0–34.0)
MCHC: 30.9 g/dL (ref 30.0–36.0)
MCV: 92.1 fL (ref 80.0–100.0)
Platelets: 267 K/uL (ref 150–400)
RBC: 4.54 MIL/uL (ref 3.87–5.11)
RDW: 13.9 % (ref 11.5–15.5)
WBC: 6.6 K/uL (ref 4.0–10.5)
nRBC: 0 % (ref 0.0–0.2)

## 2023-09-25 LAB — FERRITIN: Ferritin: 156 ng/mL (ref 11–307)

## 2023-09-25 LAB — RETICULOCYTES
Immature Retic Fract: 5.1 % (ref 2.3–15.9)
RBC.: 4.57 MIL/uL (ref 3.87–5.11)
Retic Count, Absolute: 72.7 10*3/uL (ref 19.0–186.0)
Retic Ct Pct: 1.6 % (ref 0.4–3.1)

## 2023-09-25 LAB — IRON AND IRON BINDING CAPACITY (CC-WL,HP ONLY)
Iron: 106 ug/dL (ref 28–170)
Saturation Ratios: 29 % (ref 10.4–31.8)
TIBC: 365 ug/dL (ref 250–450)
UIBC: 259 ug/dL (ref 148–442)

## 2023-09-25 LAB — VITAMIN B12: Vitamin B-12: 250 pg/mL (ref 180–914)

## 2023-09-25 MED ORDER — SODIUM CHLORIDE 0.9 % IV SOLN: Freq: Once | INTRAVENOUS | Status: AC

## 2023-09-25 MED ORDER — IRON SUCROSE 20 MG/ML IV SOLN
200.0000 mg | Freq: Once | INTRAVENOUS | Status: AC
  Administered 2023-09-25: 200 mg via INTRAVENOUS
  Filled 2023-09-25: qty 10

## 2023-09-25 NOTE — Patient Instructions (Signed)
Iron Sucrose Injection What is this medication? IRON SUCROSE (EYE ern SOO krose) treats low levels of iron (iron deficiency anemia) in people with kidney disease. Iron is a mineral that plays an important role in making red blood cells, which carry oxygen from your lungs to the rest of your body. This medicine may be used for other purposes; ask your health care provider or pharmacist if you have questions. COMMON BRAND NAME(S): Venofer What should I tell my care team before I take this medication? They need to know if you have any of these conditions: Anemia not caused by low iron levels Heart disease High levels of iron in the blood Kidney disease Liver disease An unusual or allergic reaction to iron, other medications, foods, dyes, or preservatives Pregnant or trying to get pregnant Breastfeeding How should I use this medication? This medication is for infusion into a vein. It is given in a hospital or clinic setting. Talk to your care team about the use of this medication in children. While this medication may be prescribed for children as young as 2 years for selected conditions, precautions do apply. Overdosage: If you think you have taken too much of this medicine contact a poison control center or emergency room at once. NOTE: This medicine is only for you. Do not share this medicine with others. What if I miss a dose? Keep appointments for follow-up doses. It is important not to miss your dose. Call your care team if you are unable to keep an appointment. What may interact with this medication? Do not take this medication with any of the following: Deferoxamine Dimercaprol Other iron products This medication may also interact with the following: Chloramphenicol Deferasirox This list may not describe all possible interactions. Give your health care provider a list of all the medicines, herbs, non-prescription drugs, or dietary supplements you use. Also tell them if you smoke,  drink alcohol, or use illegal drugs. Some items may interact with your medicine. What should I watch for while using this medication? Visit your care team regularly. Tell your care team if your symptoms do not start to get better or if they get worse. You may need blood work done while you are taking this medication. You may need to follow a special diet. Talk to your care team. Foods that contain iron include: whole grains/cereals, dried fruits, beans, or peas, leafy green vegetables, and organ meats (liver, kidney). What side effects may I notice from receiving this medication? Side effects that you should report to your care team as soon as possible: Allergic reactions--skin rash, itching, hives, swelling of the face, lips, tongue, or throat Low blood pressure--dizziness, feeling faint or lightheaded, blurry vision Shortness of breath Side effects that usually do not require medical attention (report to your care team if they continue or are bothersome): Flushing Headache Joint pain Muscle pain Nausea Pain, redness, or irritation at injection site This list may not describe all possible side effects. Call your doctor for medical advice about side effects. You may report side effects to FDA at 1-800-FDA-1088. Where should I keep my medication? This medication is given in a hospital or clinic. It will not be stored at home. NOTE: This sheet is a summary. It may not cover all possible information. If you have questions about this medicine, talk to your doctor, pharmacist, or health care provider.  2024 Elsevier/Gold Standard (2023-04-05 00:00:00)

## 2023-09-25 NOTE — Progress Notes (Signed)
Hematology and Oncology Follow Up Visit  Sheri [name] [id number] 09-Sep-1976 47 y.o. 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  She is doing pretty well.  She is under some stress today.  She has some things going on with her personal life.  I know that she is very strong woman.  We will certainly help her out any way that we can..  She gets her Venofer today.  When we checked her iron a month ago, her ferritin was only 18.  Her last vitamin B12 level was 192 in October.  Throat  She has had no obvious bleeding.  There is been no fever.  She has had no problems with COVID.Marland Kitchen  She has had no chest pain.  She has had no change in bowel or bladder habits.  Has been no leg swelling.  She is trying to lose some weight.  I think that she is on one of the new weight loss medications.  Hopefully, this will help her with weight loss.  Overall, I would say that her performance status is probably ECOG 0.   Medications:  Allergies as of 09/25/2023       Reactions   Aspirin Other (See Comments)   Pt. Has had gastric bypass surgery.  ASA causes ulcers.         Medication List        Accurate as of September 25, 2023  1:42 PM. If you have any questions, ask your nurse or doctor.          acetaminophen 500 MG tablet Commonly known as: TYLENOL Take 2 tablets (1,000 mg total) by mouth 4 (four) times daily.   B-D 3CC LUER-LOK SYR 23GX1-1/2 23G X 1-1/2" 3 ML Misc Generic drug: SYRINGE-NEEDLE (DISP) 3 ML SMARTSIG:Injection Once a Month   Cyanocobalamin 1000 MCG/ML Kit 1 mL Injection   cyanocobalamin 1000 MCG/ML injection Commonly known as: VITAMIN B12 INJECT 1 ML (1,000 MCG TOTAL) INTO THE MUSCLE  EVERY 30 DAYS.   docusate sodium 100 MG capsule Commonly known as: Colace Take 1 capsule (100 mg total) by mouth 2 (two) times daily.   escitalopram 10 MG tablet Commonly known as: Lexapro Take 1.5 tablets (15 mg total) by mouth daily.   levocetirizine 2.5 MG/5ML solution Commonly known as: XYZAL Take 2.5 mg by mouth every evening.   montelukast 10 MG tablet Commonly known as: SINGULAIR Take 1 tablet (10 mg total) by mouth at bedtime.   Norethindrone-Ethinyl Estradiol-Fe 0.8-25 MG-MCG tablet Commonly known as: Kaitlib Fe CHEW 1 TABLET DAILY. (TAKESCONTINUOUS ACTIVE ORAL CONTRACEPTIVE PILLS.)   Vitamin D (Ergocalciferol) 1.25 MG (50000 UNIT) Caps capsule Commonly known as: DRISDOL TAKE 1 CAPSULE BY MOUTH ONE TIME PER WEEK   Wegovy 2.4 MG/0.75ML Soaj Generic drug: Semaglutide-Weight Management Inject into the skin.   Zepbound 5 MG/0.5ML Pen Generic drug: tirzepatide Inject 5 mg into the skin once a week.        Allergies:  Allergies  Allergen Reactions   Aspirin Other (See Comments)    Pt. Has had gastric bypass surgery.  ASA causes ulcers.     Past Medical History, Surgical history, Social history, and Family History were reviewed and  updated.  Review of Systems: Review of Systems  Constitutional: Negative.   HENT: Negative.    Eyes: Negative.   Respiratory: Negative.    Cardiovascular: Negative.   Gastrointestinal: Negative.   Genitourinary: Negative.   Musculoskeletal: Negative.   Skin: Negative.   Neurological: Negative.   Endo/Heme/Allergies: Negative.   Psychiatric/Behavioral: Negative.     Marland Kitchen   Physical Exam:  height is 5' 5.5" (1.664 m) and weight is 231 lb (104.8 kg). Her oral temperature is 98.6 F (37 C). Her blood pressure is 119/66 and her pulse is 65. Her respiration is 18 and oxygen saturation is 100%.   Wt Readings from Last 3 Encounters:  09/25/23 231 lb (104.8 kg)  08/19/23 235 lb (106.6 kg)  03/25/23 243 lb (110.2 kg)     Physical Exam Vitals reviewed.  HENT:     Head: Normocephalic and atraumatic.  Eyes:     Pupils: Pupils are equal, round, and reactive to light.  Cardiovascular:     Rate and Rhythm: Normal rate and regular rhythm.     Heart sounds: Normal heart sounds.  Pulmonary:     Effort: Pulmonary effort is normal.     Breath sounds: Normal breath sounds.  Abdominal:     General: Bowel sounds are normal.     Palpations: Abdomen is soft.  Musculoskeletal:        General: No tenderness or deformity. Normal range of motion.     Cervical back: Normal range of motion.  Lymphadenopathy:     Cervical: No cervical adenopathy.  Skin:    General: Skin is warm and dry.     Findings: No erythema or rash.  Neurological:     Mental Status: She is alert and oriented to person, place, and time.  Psychiatric:        Behavior: Behavior normal.        Thought Content: Thought content normal.        Judgment: Judgment normal.     Lab Results  Component Value Date   WBC 5.5 08/19/2023   HGB 13.1 08/19/2023   HCT 41.1 08/19/2023   MCV 88.6 08/19/2023   PLT 290 08/19/2023   Lab Results  Component Value Date   FERRITIN 18 08/19/2023   IRON 128 08/19/2023   TIBC 427 08/19/2023   UIBC 299 08/19/2023   IRONPCTSAT 30 08/19/2023   Lab Results  Component Value Date   RETICCTPCT 1.2 08/19/2023   RBC 4.59 08/19/2023   No results found for: "KPAFRELGTCHN", "LAMBDASER", "KAPLAMBRATIO" No results found for: "IGGSERUM", "IGA", "IGMSERUM" No results found for: "TOTALPROTELP", "ALBUMINELP", "A1GS", "A2GS", "BETS", "BETA2SER", "GAMS", "MSPIKE", "SPEI"   Chemistry      Component Value Date/Time   NA 138 12/01/2022 2000   NA 144 10/08/2017 0812   NA 140 07/10/2017 0838   K 3.6 12/01/2022 2000   K 3.9 10/08/2017 0812   K 3.6 07/10/2017 0838   CL 108 12/01/2022 2000   CL 109 (H) 10/08/2017 0812   CO2 23 12/01/2022 2000   CO2 25 10/08/2017 0812   CO2 23 07/10/2017 0838   BUN 10 12/01/2022 2000    BUN 9 10/08/2017 0812   BUN 9.0 07/10/2017 0838   CREATININE 0.74 12/01/2022 2000   CREATININE 0.90 03/26/2022 1406   CREATININE 0.8 10/08/2017 0812   CREATININE 0.8 07/10/2017 0838      Component Value Date/Time   CALCIUM 9.5 12/01/2022 2000   CALCIUM 9.1 10/08/2017 0812   CALCIUM 9.2 07/10/2017 4098  ALKPHOS 73 12/01/2022 2000   ALKPHOS 78 10/08/2017 0812   ALKPHOS 127 07/10/2017 0838   AST 22 12/01/2022 2000   AST 20 03/26/2022 1406   AST 22 07/10/2017 0838   ALT 20 12/01/2022 2000   ALT 22 03/26/2022 1406   ALT 23 10/08/2017 0812   ALT 22 07/10/2017 0838   BILITOT 0.3 12/01/2022 2000   BILITOT 0.3 03/26/2022 1406   BILITOT 0.34 07/10/2017 0838      No diagnosis found.   Impression and Plan: Ms. Nellum is a very pleasant 47 yo [place] American female with iron, B-12 and Vit D deficiency 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   Overall, I think she is doing okay.  She clearly benefits from the IV iron.  Her vitamin B12 level was a little bit on the low side.  I need to make sure that she is doing her vitamin B12.  We will plan to get her back to see [place] in another couple months.  We will try to get her through the Holiday season.  Josph Macho, MD 11/13/20241:42 PM

## 2023-10-21 ENCOUNTER — Ambulatory Visit (HOSPITAL_BASED_OUTPATIENT_CLINIC_OR_DEPARTMENT_OTHER): Payer: No Typology Code available for payment source | Admitting: Certified Nurse Midwife

## 2023-10-21 ENCOUNTER — Encounter (HOSPITAL_BASED_OUTPATIENT_CLINIC_OR_DEPARTMENT_OTHER): Payer: Self-pay | Admitting: Certified Nurse Midwife

## 2023-10-21 VITALS — BP 112/82 | HR 77 | Ht 65.5 in | Wt 233.0 lb

## 2023-10-21 DIAGNOSIS — F341 Dysthymic disorder: Secondary | ICD-10-CM | POA: Diagnosis not present

## 2023-10-21 DIAGNOSIS — Z3041 Encounter for surveillance of contraceptive pills: Secondary | ICD-10-CM

## 2023-10-21 DIAGNOSIS — R232 Flushing: Secondary | ICD-10-CM | POA: Diagnosis not present

## 2023-10-21 DIAGNOSIS — Z01419 Encounter for gynecological examination (general) (routine) without abnormal findings: Secondary | ICD-10-CM

## 2023-10-21 DIAGNOSIS — E66812 Obesity, class 2: Secondary | ICD-10-CM | POA: Insufficient documentation

## 2023-10-21 DIAGNOSIS — E559 Vitamin D deficiency, unspecified: Secondary | ICD-10-CM | POA: Diagnosis not present

## 2023-10-21 DIAGNOSIS — Z6837 Body mass index (BMI) 37.0-37.9, adult: Secondary | ICD-10-CM | POA: Insufficient documentation

## 2023-10-21 MED ORDER — ESCITALOPRAM OXALATE 20 MG PO TABS: 20.0000 mg | ORAL_TABLET | Freq: Every day | ORAL | 5 refills | Status: DC

## 2023-10-21 MED ORDER — VITAMIN D (ERGOCALCIFEROL) 1.25 MG (50000 UNIT) PO CAPS: ORAL_CAPSULE | ORAL | 4 refills | Status: DC

## 2023-10-21 MED ORDER — NORETHIN-ETH ESTRADIOL-FE 0.8-25 MG-MCG PO CHEW: CHEWABLE_TABLET | ORAL | 4 refills | Status: DC

## 2023-10-21 NOTE — Progress Notes (Signed)
47 y.o. G2P0 Single Black or [place] American female here for annual exam.  She has lost 39lb since her last annual gyn exam in 10/2022. Was on Zepbound and now on Wegovy 2.4mg  weekly. Pt taking Lexapro 15mg  po daily and may want to increase to 20mg  po once daily. She denies any vaginal spotting or bleeding since 2012. She does experience hot flashes and night sweats. States experiences severe night sweats approx 7 nights each monthly.  Patient's last menstrual period was 11/12/2010 (approximate).          Sexually active: No.  The current method of family planning is abstinence.     The pregnancy intention screening data noted above was reviewed. Potential methods of contraception were discussed. The patient elected to proceed with No data recorded.  Exercising: Yes.     Smoker:  no  Health Maintenance: Pap:  2021 Negative with High Risk HPV Negative History of abnormal Pap:  no MMG:  08/09/23 Bi-Rads 1 (negative) Colonoscopy:  02/06/2021, follow-up in 7 years BMD:   not indicated Screening Labs: done by PCP  Most recent H/H: 12.9/41.8 (09/25/23)   reports that she has never smoked. She has never used smokeless tobacco. She reports that she does not drink alcohol and does not use drugs.  Past Medical History:  Diagnosis Date   Allergy    seasonal allergies   Anemia    and low B12-on meds   Family history of adverse reaction to anesthesia    GERD (gastroesophageal reflux disease)    with certain foods/due to gastric bypass   Heart murmur    as child - no problems as adult   History of blood transfusion 2004   [facility] after surgery   Hypothyroidism    history=resolved   Inflammation of joint of knee    right knee-on anti-inflammatory, arthritis   Seasonal allergies    Sleep apnea    Vitamin B 12 deficiency     Past Surgical History:  Procedure Laterality Date   ABDOMINOPLASTY  2004   BREAST REDUCTION SURGERY  2005   CHOLECYSTECTOMY N/A 12/07/2022    Procedure: LAPAROSCOPIC CHOLECYSTECTOMY;  Surgeon: Diamantina Monks, MD;  Location: [facility] OR;  Service: General;  Laterality: N/A;   COLONOSCOPY  2023   GASTRIC BYPASS  2001   Dr Marcos Eke, in [place]   LAPAROSCOPIC OVARIAN CYSTECTOMY Right 05/17/2014   Procedure: LAPAROSCOPIC  partial  right salpingo-oohorectomy  and cystoscopy;  Surgeon: Annamaria Boots, MD;  Location: [facility] ORS;  Service: Gynecology;  Laterality: Right;   REDUCTION MAMMAPLASTY Bilateral 2004   WISDOM TOOTH EXTRACTION  2014   upper right    Current Outpatient Medications  Medication Sig Dispense Refill   acetaminophen (TYLENOL) 500 MG tablet Take 2 tablets (1,000 mg total) by mouth 4 (four) times daily. 120 tablet 3   B-D 3CC LUER-LOK SYR 23GX1-1/2 23G X 1-1/2" 3 ML MISC SMARTSIG:Injection Once a Month     cyanocobalamin (VITAMIN B12) 1000 MCG/ML injection INJECT 1 ML (1,000 MCG TOTAL) INTO THE MUSCLE EVERY 30 DAYS. 10 mL 2   Cyanocobalamin 1000 MCG/ML KIT 1 mL Injection     docusate sodium (COLACE) 100 MG capsule Take 1 capsule (100 mg total) by mouth 2 (two) times daily. 60 capsule 2   levocetirizine (XYZAL) 2.5 MG/5ML solution Take 2.5 mg by mouth every evening.     montelukast (SINGULAIR) 10 MG tablet Take 1 tablet (10 mg total) by mouth at bedtime. 90 tablet 4  WEGOVY 2.4 MG/0.75ML SOAJ Inject into the skin.     escitalopram (LEXAPRO) 20 MG tablet Take 1 tablet (20 mg total) by mouth daily. 90 tablet 5   Norethindrone-Ethinyl Estradiol-Fe (KAITLIB FE) 0.8-25 MG-MCG tablet CHEW 1 TABLET DAILY. (TAKESCONTINUOUS ACTIVE ORAL CONTRACEPTIVE PILLS.) 112 tablet 4   Vitamin D, Ergocalciferol, (DRISDOL) 1.25 MG (50000 UNIT) CAPS capsule TAKE 1 CAPSULE BY MOUTH ONE TIME PER WEEK 12 capsule 4   No current facility-administered medications for this visit.    Family History  Problem Relation Age of Onset   Stroke Sister        stroke after cancer diagnosis around age 107   Diabetes Sister    Hypertension Sister     Breast cancer Sister 93   Diabetes Sister    Hypertension Sister    Hypertension Brother    Diabetes Brother    Heart disease Brother    Diabetes Brother    Hypertension Brother    Heart disease Brother    Diabetes Brother    Hypertension Brother    Heart disease Brother    CVA Maternal Grandmother    Colon polyps Neg Hx    Colon cancer Neg Hx    Esophageal cancer Neg Hx    Rectal cancer Neg Hx    Stomach cancer Neg Hx     ROS: Constitutional: negative Genitourinary:negative  Exam:   BP 112/82 (BP Location: Right Arm, Patient Position: Sitting, Cuff Size: Large)   Pulse 77   Ht 5' 5.5" (1.664 m)   Wt 233 lb (105.7 kg)   LMP 11/12/2010 (Approximate)   BMI 38.18 kg/m   Height: 5' 5.5" (166.4 cm)  General appearance: alert, cooperative and appears stated age Head: Normocephalic, without obvious abnormality, atraumatic Lungs: clear to auscultation bilaterally Breasts: normal appearance, no masses or tenderness, Inspection negative, No nipple retraction or dimpling, No nipple discharge or bleeding, No axillary or supraclavicular adenopathy, Normal to palpation without dominant masses, Taught monthly breast self examination, +scars present bilaterally from prior bilateral breast reduction Heart: regular rate and rhythm Abdomen: soft, non-tender; bowel sounds normal; no masses,  no organomegaly Extremities: extremities normal, atraumatic, no cyanosis or edema Skin: Skin color, texture, turgor normal. No rashes or lesions Lymph nodes: Cervical, supraclavicular, and axillary nodes normal. No abnormal inguinal nodes palpated Neurologic: Grossly normal   Pelvic: External genitalia:  no lesions              Urethra:  normal appearing urethra with no masses, tenderness or lesions              Bartholins and Skenes: normal                 Vagina: normal appearing vagina with normal color and no discharge, no lesions              Cervix: no cervical motion tenderness, no lesions,  and nulliparous appearance              Pap taken: No. Bimanual Exam:  Uterus:  normal size, contour, position, consistency, mobility, non-tender              Adnexa: no mass, fullness, tenderness               Anus:  normal sphincter tone, no lesions  Chaperone, Hendricks Milo, CMA, was present for exam.  Assessment/Plan:  1. Well woman exam with routine gynecological exam - Norethindrone-Ethinyl Estradiol-Fe (KAITLIB FE) 0.8-25 MG-MCG tablet; CHEW 1 TABLET DAILY. (TAKESCONTINUOUS  ACTIVE ORAL CONTRACEPTIVE PILLS.)  Dispense: 112 tablet; Refill: 4  2. Dysthymia - escitalopram (LEXAPRO) 20 MG tablet; Take 1 tablet (20 mg total) by mouth daily.  Dispense: 90 tablet; Refill: 5  3. Vitamin D deficiency - Vitamin D, Ergocalciferol, (DRISDOL) 1.25 MG (50000 UNIT) CAPS capsule; TAKE 1 CAPSULE BY MOUTH ONE TIME PER WEEK  Dispense: 12 capsule; Refill: 4  4. Hot flashes   5. Surveillance of previously prescribed contraceptive pill - Refill sent.  RTO one year for annual gyn exam and screening pap smear and prn if issues arise. Letta Kocher

## 2023-10-29 ENCOUNTER — Ambulatory Visit (HOSPITAL_BASED_OUTPATIENT_CLINIC_OR_DEPARTMENT_OTHER): Payer: No Typology Code available for payment source | Admitting: Obstetrics & Gynecology

## 2023-11-13 DIAGNOSIS — Z419 Encounter for procedure for purposes other than remedying health state, unspecified: Secondary | ICD-10-CM | POA: Diagnosis not present

## 2023-11-28 ENCOUNTER — Encounter: Payer: Self-pay | Admitting: Family

## 2023-12-05 ENCOUNTER — Encounter: Payer: Self-pay | Admitting: Medical Oncology

## 2023-12-05 ENCOUNTER — Inpatient Hospital Stay: Payer: No Typology Code available for payment source | Attending: Hematology & Oncology

## 2023-12-05 ENCOUNTER — Inpatient Hospital Stay: Payer: No Typology Code available for payment source | Admitting: Medical Oncology

## 2023-12-05 VITALS — BP 98/71 | HR 80 | Temp 98.5°F | Resp 18 | Ht 65.0 in | Wt 234.1 lb

## 2023-12-05 DIAGNOSIS — K912 Postsurgical malabsorption, not elsewhere classified: Secondary | ICD-10-CM | POA: Insufficient documentation

## 2023-12-05 DIAGNOSIS — D508 Other iron deficiency anemias: Secondary | ICD-10-CM | POA: Insufficient documentation

## 2023-12-05 DIAGNOSIS — D500 Iron deficiency anemia secondary to blood loss (chronic): Secondary | ICD-10-CM

## 2023-12-05 DIAGNOSIS — D510 Vitamin B12 deficiency anemia due to intrinsic factor deficiency: Secondary | ICD-10-CM | POA: Diagnosis not present

## 2023-12-05 DIAGNOSIS — R61 Generalized hyperhidrosis: Secondary | ICD-10-CM | POA: Insufficient documentation

## 2023-12-05 DIAGNOSIS — Z9884 Bariatric surgery status: Secondary | ICD-10-CM | POA: Diagnosis not present

## 2023-12-05 DIAGNOSIS — E538 Deficiency of other specified B group vitamins: Secondary | ICD-10-CM | POA: Diagnosis not present

## 2023-12-05 DIAGNOSIS — E559 Vitamin D deficiency, unspecified: Secondary | ICD-10-CM | POA: Insufficient documentation

## 2023-12-05 LAB — RETICULOCYTES
Immature Retic Fract: 3 % (ref 2.3–15.9)
RBC.: 4.59 MIL/uL (ref 3.87–5.11)
Retic Count, Absolute: 51.4 10*3/uL (ref 19.0–186.0)
Retic Ct Pct: 1.1 % (ref 0.4–3.1)

## 2023-12-05 LAB — IRON AND IRON BINDING CAPACITY (CC-WL,HP ONLY)
Iron: 143 ug/dL (ref 28–170)
Saturation Ratios: 41 % — ABNORMAL HIGH (ref 10.4–31.8)
TIBC: 353 ug/dL (ref 250–450)
UIBC: 210 ug/dL (ref 148–442)

## 2023-12-05 LAB — CBC WITH DIFFERENTIAL (CANCER CENTER ONLY)
Abs Immature Granulocytes: 0.01 10*3/uL (ref 0.00–0.07)
Basophils Absolute: 0 10*3/uL (ref 0.0–0.1)
Basophils Relative: 1 %
Eosinophils Absolute: 0 10*3/uL (ref 0.0–0.5)
Eosinophils Relative: 1 %
HCT: 41.3 % (ref 36.0–46.0)
Hemoglobin: 13.4 g/dL (ref 12.0–15.0)
Immature Granulocytes: 0 %
Lymphocytes Relative: 30 %
Lymphs Abs: 1.6 10*3/uL (ref 0.7–4.0)
MCH: 29.1 pg (ref 26.0–34.0)
MCHC: 32.4 g/dL (ref 30.0–36.0)
MCV: 89.6 fL (ref 80.0–100.0)
Monocytes Absolute: 0.4 10*3/uL (ref 0.1–1.0)
Monocytes Relative: 7 %
Neutro Abs: 3.4 10*3/uL (ref 1.7–7.7)
Neutrophils Relative %: 61 %
Platelet Count: 293 10*3/uL (ref 150–400)
RBC: 4.61 MIL/uL (ref 3.87–5.11)
RDW: 13.3 % (ref 11.5–15.5)
WBC Count: 5.5 10*3/uL (ref 4.0–10.5)
nRBC: 0 % (ref 0.0–0.2)

## 2023-12-05 LAB — CMP (CANCER CENTER ONLY)
ALT: 11 U/L (ref 0–44)
AST: 15 U/L (ref 15–41)
Albumin: 3.9 g/dL (ref 3.5–5.0)
Alkaline Phosphatase: 99 U/L (ref 38–126)
Anion gap: 7 (ref 5–15)
BUN: 12 mg/dL (ref 6–20)
CO2: 23 mmol/L (ref 22–32)
Calcium: 9.1 mg/dL (ref 8.9–10.3)
Chloride: 107 mmol/L (ref 98–111)
Creatinine: 0.75 mg/dL (ref 0.44–1.00)
GFR, Estimated: >60 mL/min (ref >60)
Glucose, Bld: 129 mg/dL — ABNORMAL HIGH (ref 70–99)
Potassium: 3.7 mmol/L (ref 3.5–5.1)
Sodium: 137 mmol/L (ref 135–145)
Total Bilirubin: 0.4 mg/dL (ref 0.0–1.2)
Total Protein: 6.8 g/dL (ref 6.5–8.1)

## 2023-12-05 LAB — FERRITIN: Ferritin: 107 ng/mL (ref 11–307)

## 2023-12-05 LAB — VITAMIN B12: Vitamin B-12: 239 pg/mL (ref 180–914)

## 2023-12-05 MED ORDER — BD LUER-LOK SYRINGE 23G X 1-1/2" 3 ML MISC: 1.0000 | 0 refills | Status: AC

## 2023-12-05 NOTE — Progress Notes (Signed)
Hematology and Oncology Follow Up Visit  Sheri [name] [id number] 1975/11/20 48 y.o. 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   Today she reports that she is doing ok overall.   Her last vitamin B12 level was 250 on 10/06/2024. She administers this at home. She needs a refill on syringes   There has been no bleeding to her knowledge: denies epistaxis, gingivitis, hemoptysis, hematemesis, hematuria, melena, excessive bruising, blood donation.   Has been no leg swelling. No chest pain, SOB, peripheral edema, unintentional weight loss. She has noticed some night sweats. She had a colonoscopy last year. She is UTD on her mammograms. She is taking wegovy.   Wt Readings from Last 3 Encounters:  12/05/23 234 lb 1.3 oz (106.2 kg)  10/21/23 233 lb (105.7 kg)  09/25/23 231 lb (104.8 kg)    Overall, I would say that her performance status is probably ECOG 0.   Medications:  Allergies as of 12/05/2023       Reactions   Aspirin Other (See Comments)   Pt. Has had gastric bypass surgery.  ASA causes ulcers.         Medication List        Accurate as of December 05, 2023  9:33 AM. If you have any questions, ask your nurse or doctor.          STOP taking these medications    docusate sodium 100 MG capsule Commonly known as: Colace Stopped by: Rushie Chestnut       TAKE these medications    acetaminophen 500 MG tablet Commonly known as: TYLENOL Take 2 tablets (1,000 mg total) by mouth 4 (four) times daily.   B-D 3CC LUER-LOK SYR 23GX1-1/2 23G X 1-1/2" 3 ML Misc Generic drug: SYRINGE-NEEDLE (DISP) 3 ML SMARTSIG:Injection Once a Month   cyanocobalamin 1000 MCG/ML injection Commonly  known as: VITAMIN B12 INJECT 1 ML (1,000 MCG TOTAL) INTO THE MUSCLE EVERY 30 DAYS.   escitalopram 20 MG tablet Commonly known as: Lexapro Take 1 tablet (20 mg total) by mouth daily.   levocetirizine 2.5 MG/5ML solution Commonly known as: XYZAL Take 2.5 mg by mouth every evening.   montelukast 10 MG tablet Commonly known as: SINGULAIR Take 1 tablet (10 mg total) by mouth at bedtime.   Norethindrone-Ethinyl Estradiol-Fe 0.8-25 MG-MCG tablet Commonly known as: Kaitlib Fe CHEW 1 TABLET DAILY. (TAKESCONTINUOUS ACTIVE ORAL CONTRACEPTIVE PILLS.)   Vitamin D (Ergocalciferol) 1.25 MG (50000 UNIT) Caps capsule Commonly known as: DRISDOL TAKE 1 CAPSULE BY MOUTH ONE TIME PER WEEK   Wegovy 2.4 MG/0.75ML Soaj Generic drug: Semaglutide-Weight Management Inject into the skin.        Allergies:  Allergies  Allergen Reactions   Aspirin Other (See Comments)    Pt. Has had gastric bypass surgery.  ASA causes ulcers.     Past Medical History, Surgical history, Social history, and Family History were reviewed and updated.  Review of Systems: Review of Systems  Constitutional: Negative.   HENT: Negative.    Eyes: Negative.   Respiratory: Negative.    Cardiovascular: Negative.   Gastrointestinal: Negative.   Genitourinary: Negative.   Musculoskeletal: Negative.   Skin: Negative.   Neurological:  Negative.   Endo/Heme/Allergies: Negative.   Psychiatric/Behavioral: Negative.     Marland Kitchen   Physical Exam:  height is 5\' 5"  (1.651 m) and weight is 234 lb 1.3 oz (106.2 kg). Her oral temperature is 98.5 F (36.9 C). Her blood pressure is 98/71 and her pulse is 80. Her respiration is 18 and oxygen saturation is 98%.   Wt Readings from Last 3 Encounters:  12/05/23 234 lb 1.3 oz (106.2 kg)  10/21/23 233 lb (105.7 kg)  09/25/23 231 lb (104.8 kg)    Physical Exam Vitals reviewed.  HENT:     Head: Normocephalic and atraumatic.  Eyes:     Pupils: Pupils are equal, round, and reactive to  light.  Cardiovascular:     Rate and Rhythm: Normal rate and regular rhythm.     Heart sounds: Normal heart sounds.  Pulmonary:     Effort: Pulmonary effort is normal.     Breath sounds: Normal breath sounds.  Abdominal:     General: Bowel sounds are normal.     Palpations: Abdomen is soft.  Musculoskeletal:        General: No tenderness or deformity. Normal range of motion.     Cervical back: Normal range of motion.  Lymphadenopathy:     Cervical: No cervical adenopathy.  Skin:    General: Skin is warm and dry.     Findings: No erythema or rash.  Neurological:     Mental Status: She is alert and oriented to person, place, and time.  Psychiatric:        Behavior: Behavior normal.        Thought Content: Thought content normal.        Judgment: Judgment normal.     Lab Results  Component Value Date   WBC 5.5 12/05/2023   HGB 13.4 12/05/2023   HCT 41.3 12/05/2023   MCV 89.6 12/05/2023   PLT 293 12/05/2023   Lab Results  Component Value Date   FERRITIN 156 09/25/2023   IRON 106 09/25/2023   TIBC 365 09/25/2023   UIBC 259 09/25/2023   IRONPCTSAT 29 09/25/2023   Lab Results  Component Value Date   RETICCTPCT 1.1 12/05/2023   RBC 4.59 12/05/2023   RBC 4.61 12/05/2023   No results found for: "KPAFRELGTCHN", "LAMBDASER", "KAPLAMBRATIO" No results found for: "IGGSERUM", "IGA", "IGMSERUM" No results found for: "TOTALPROTELP", "ALBUMINELP", "A1GS", "A2GS", "BETS", "BETA2SER", "GAMS", "MSPIKE", "SPEI"   Chemistry      Component Value Date/Time   NA 138 12/01/2022 2000   NA 144 10/08/2017 0812   NA 140 07/10/2017 0838   K 3.6 12/01/2022 2000   K 3.9 10/08/2017 0812   K 3.6 07/10/2017 0838   CL 108 12/01/2022 2000   CL 109 (H) 10/08/2017 0812   CO2 23 12/01/2022 2000   CO2 25 10/08/2017 0812   CO2 23 07/10/2017 0838   BUN 10 12/01/2022 2000   BUN 9 10/08/2017 0812   BUN 9.0 07/10/2017 0838   CREATININE 0.74 12/01/2022 2000   CREATININE 0.90 03/26/2022 1406    CREATININE 0.8 10/08/2017 0812   CREATININE 0.8 07/10/2017 0838      Component Value Date/Time   CALCIUM 9.5 12/01/2022 2000   CALCIUM 9.1 10/08/2017 0812   CALCIUM 9.2 07/10/2017 0838   ALKPHOS 73 12/01/2022 2000   ALKPHOS 78 10/08/2017 0812   ALKPHOS 127 07/10/2017 0838   AST 22 12/01/2022 2000   AST 20 03/26/2022 1406   AST 22 07/10/2017 0838   ALT  20 12/01/2022 2000   ALT 22 03/26/2022 1406   ALT 23 10/08/2017 0812   ALT 22 07/10/2017 0838   BILITOT 0.3 12/01/2022 2000   BILITOT 0.3 03/26/2022 1406   BILITOT 0.34 07/10/2017 1610      Encounter Diagnoses  Name Primary?   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   Today Hgb is normal at 13.4. Reticulocytes are normal.   We need to watch her night sweats closely. Good thing is that her weight is stable. Blood counts look good. May be related to her wegovy. Since she has not seen her PCP for general HM items/labs recently I have advised her to call to schedule this appointment.   RTC 3 months carter, labs (CBC, iron, ferritin, retic, B12)  Rushie Chestnut, PA-C 1/23/20259:33 AM

## 2023-12-14 DIAGNOSIS — Z419 Encounter for procedure for purposes other than remedying health state, unspecified: Secondary | ICD-10-CM | POA: Diagnosis not present

## 2024-01-11 DIAGNOSIS — Z419 Encounter for procedure for purposes other than remedying health state, unspecified: Secondary | ICD-10-CM | POA: Diagnosis not present

## 2024-01-15 ENCOUNTER — Other Ambulatory Visit (HOSPITAL_BASED_OUTPATIENT_CLINIC_OR_DEPARTMENT_OTHER): Payer: Self-pay | Admitting: Obstetrics & Gynecology

## 2024-01-15 DIAGNOSIS — T7840XD Allergy, unspecified, subsequent encounter: Secondary | ICD-10-CM

## 2024-01-16 ENCOUNTER — Encounter (HOSPITAL_BASED_OUTPATIENT_CLINIC_OR_DEPARTMENT_OTHER): Payer: Self-pay | Admitting: Obstetrics & Gynecology

## 2024-01-20 ENCOUNTER — Ambulatory Visit (INDEPENDENT_AMBULATORY_CARE_PROVIDER_SITE_OTHER): Admitting: Obstetrics & Gynecology

## 2024-01-20 ENCOUNTER — Encounter (HOSPITAL_BASED_OUTPATIENT_CLINIC_OR_DEPARTMENT_OTHER): Payer: Self-pay | Admitting: Obstetrics & Gynecology

## 2024-01-20 VITALS — BP 104/82 | HR 96 | Ht 65.25 in | Wt 232.8 lb

## 2024-01-20 DIAGNOSIS — E611 Iron deficiency: Secondary | ICD-10-CM | POA: Diagnosis not present

## 2024-01-20 DIAGNOSIS — Z9884 Bariatric surgery status: Secondary | ICD-10-CM | POA: Diagnosis not present

## 2024-01-20 DIAGNOSIS — Z79899 Other long term (current) drug therapy: Secondary | ICD-10-CM | POA: Diagnosis not present

## 2024-01-20 DIAGNOSIS — R232 Flushing: Secondary | ICD-10-CM | POA: Diagnosis not present

## 2024-01-20 NOTE — Progress Notes (Signed)
 GYNECOLOGY  VISIT  CC:   hot flashes  HPI: 48 y.o. G36P0020 Single Black or African American female here for complaint of worsening hot flashes and nights sweats that are definitely worse over the past few months.  She does have some malabsorption issues and has chronic issues with iron deficiency.  Has five iron infsion in November and December.  Last iron level was 12/05/2023 with ferritin 107 and iron was 142.  Hb was normal.  Has not had recent thyroid testing.  Is on OCPs and does not bleed with how she takes this.   Past Medical History:  Diagnosis Date   Allergy    seasonal allergies   Anemia    and low B12-on meds   Family history of adverse reaction to anesthesia    GERD (gastroesophageal reflux disease)    with certain foods/due to gastric bypass   Heart murmur    as child - no problems as adult   History of blood transfusion 2004   [facility], after surgery   Hypothyroidism    history=resolved   Inflammation of joint of knee    right knee-on anti-inflammatory, arthritis   Seasonal allergies    Sleep apnea    Vitamin B 12 deficiency     MEDS:   Current Outpatient Medications on File Prior to Visit  Medication Sig Dispense Refill   B-D 3CC LUER-LOK SYR 23GX1-1/2 23G X 1-1/2" 3 ML MISC Inject 1 each into the skin every 30 (thirty) days. SMARTSIG:Injection Once a Month 50 each 0   cyanocobalamin (VITAMIN B12) 1000 MCG/ML injection INJECT 1 ML (1,000 MCG TOTAL) INTO THE MUSCLE EVERY 30 DAYS. 10 mL 2   escitalopram (LEXAPRO) 20 MG tablet Take 1 tablet (20 mg total) by mouth daily. 90 tablet 5   levocetirizine (XYZAL) 2.5 MG/5ML solution Take 2.5 mg by mouth every evening.     montelukast (SINGULAIR) 10 MG tablet TAKE 1 TABLET BY MOUTH EVERYDAY AT BEDTIME 90 tablet 4   Norethindrone-Ethinyl Estradiol-Fe (KAITLIB FE) 0.8-25 MG-MCG tablet CHEW 1 TABLET DAILY. (TAKESCONTINUOUS ACTIVE ORAL CONTRACEPTIVE PILLS.) 112 tablet 4   Vitamin D, Ergocalciferol, (DRISDOL) 1.25  MG (50000 UNIT) CAPS capsule TAKE 1 CAPSULE BY MOUTH ONE TIME PER WEEK 12 capsule 4   WEGOVY 2.4 MG/0.75ML SOAJ Inject into the skin.     No current facility-administered medications on file prior to visit.    ALLERGIES: Aspirin  SH:  single, non smoker  Review of Systems  Constitutional:        Hot flashes    PHYSICAL EXAMINATION:    BP 104/82 (BP Location: Right Arm, Patient Position: Sitting, Cuff Size: Large)   Pulse 96   Ht 5' 5.25" (1.657 m)   Wt 232 lb 12.8 oz (105.6 kg)   LMP 11/12/2010 (Approximate)   BMI 38.44 kg/m     Physical Exam Constitutional:      Appearance: Normal appearance.  Neurological:     General: No focal deficit present.     Mental Status: She is alert.  Psychiatric:        Mood and Affect: Mood normal.     Assessment/Plan: 1. Hot flashes (Primary) - will check thyroid and FSH.  Options for treatment discussed today including HRT, Veozah, gabapentin.  She is on lexapro and doing well with this.  Doesn't seem to help hot flashes though. - TSH - Follicle stimulating hormone  2. High risk medication use - will check liver enzymes just in case we proceed  with veozah - Comprehensive metabolic panel  3. History of Roux-en-Y gastric bypass  4. Iron deficiency - has received iron infusions in November and December

## 2024-01-21 LAB — COMPREHENSIVE METABOLIC PANEL
ALT: 15 IU/L (ref 0–32)
AST: 20 IU/L (ref 0–40)
Albumin: 3.9 g/dL (ref 3.9–4.9)
Alkaline Phosphatase: 115 IU/L (ref 44–121)
BUN/Creatinine Ratio: 15 (ref 9–23)
BUN: 13 mg/dL (ref 6–24)
Bilirubin Total: 0.4 mg/dL (ref 0.0–1.2)
CO2: 22 mmol/L (ref 20–29)
Calcium: 9.3 mg/dL (ref 8.7–10.2)
Chloride: 105 mmol/L (ref 96–106)
Creatinine, Ser: 0.85 mg/dL (ref 0.57–1.00)
Globulin, Total: 2.8 g/dL (ref 1.5–4.5)
Glucose: 73 mg/dL (ref 70–99)
Potassium: 4.4 mmol/L (ref 3.5–5.2)
Sodium: 140 mmol/L (ref 134–144)
Total Protein: 6.7 g/dL (ref 6.0–8.5)
eGFR: 84 mL/min/{1.73_m2} (ref >59)

## 2024-01-21 LAB — TSH: TSH: 0.355 u[IU]/mL — ABNORMAL LOW (ref 0.450–4.500)

## 2024-01-21 LAB — FOLLICLE STIMULATING HORMONE: FSH: 9.1 m[IU]/mL

## 2024-01-24 ENCOUNTER — Other Ambulatory Visit (HOSPITAL_BASED_OUTPATIENT_CLINIC_OR_DEPARTMENT_OTHER): Payer: Self-pay | Admitting: Obstetrics & Gynecology

## 2024-01-24 DIAGNOSIS — R7989 Other specified abnormal findings of blood chemistry: Secondary | ICD-10-CM

## 2024-01-29 ENCOUNTER — Other Ambulatory Visit (HOSPITAL_BASED_OUTPATIENT_CLINIC_OR_DEPARTMENT_OTHER): Payer: Self-pay | Admitting: Obstetrics & Gynecology

## 2024-01-30 ENCOUNTER — Telehealth (HOSPITAL_BASED_OUTPATIENT_CLINIC_OR_DEPARTMENT_OTHER): Payer: Self-pay | Admitting: *Deleted

## 2024-01-30 NOTE — Telephone Encounter (Signed)
 DOB verified. Informed pt that TSH was abnormal and additional thyroid labs are recommended. Pt provided with appt for evaluation. Thank you.

## 2024-01-30 NOTE — Telephone Encounter (Signed)
-----   Message from Jerene Bears sent at 01/29/2024  5:49 AM EDT ----- Regarding: add on thyroid labs Can you call this pt?  Her TSh was abnormal.  I've tried to add on some additional thyroid labs and I thought it was going to be done but there doesn't seem to have been enough blood for reliable test results.  They tried.  Orders have been placed.  Can you have her come back in for repeat labs?  Thanks.  Rosalita Chessman

## 2024-01-31 ENCOUNTER — Other Ambulatory Visit (HOSPITAL_BASED_OUTPATIENT_CLINIC_OR_DEPARTMENT_OTHER): Payer: Self-pay | Admitting: *Deleted

## 2024-01-31 ENCOUNTER — Encounter: Payer: Self-pay | Admitting: Family

## 2024-01-31 ENCOUNTER — Other Ambulatory Visit (HOSPITAL_BASED_OUTPATIENT_CLINIC_OR_DEPARTMENT_OTHER)

## 2024-01-31 DIAGNOSIS — R7989 Other specified abnormal findings of blood chemistry: Secondary | ICD-10-CM

## 2024-02-01 LAB — T3: T3, Total: 151 ng/dL (ref 71–180)

## 2024-02-01 LAB — T4, FREE: Free T4: 1.3 ng/dL (ref 0.82–1.77)

## 2024-02-03 ENCOUNTER — Encounter (HOSPITAL_BASED_OUTPATIENT_CLINIC_OR_DEPARTMENT_OTHER): Payer: Self-pay | Admitting: Obstetrics & Gynecology

## 2024-02-11 ENCOUNTER — Other Ambulatory Visit (HOSPITAL_BASED_OUTPATIENT_CLINIC_OR_DEPARTMENT_OTHER): Payer: Self-pay | Admitting: Certified Nurse Midwife

## 2024-02-11 DIAGNOSIS — R61 Generalized hyperhidrosis: Secondary | ICD-10-CM

## 2024-02-11 MED ORDER — GABAPENTIN 100 MG PO CAPS: 100.0000 mg | ORAL_CAPSULE | Freq: Every day | ORAL | 1 refills | Status: DC

## 2024-02-18 LAB — T3: T3, Total: 158 ng/dL (ref 71–180)

## 2024-02-18 LAB — SPECIMEN STATUS REPORT

## 2024-02-18 LAB — T4, FREE: Free T4: 1.35 ng/dL (ref 0.82–1.77)

## 2024-02-19 ENCOUNTER — Inpatient Hospital Stay: Payer: No Typology Code available for payment source | Attending: Hematology & Oncology

## 2024-02-19 ENCOUNTER — Encounter: Payer: Self-pay | Admitting: Medical Oncology

## 2024-02-19 ENCOUNTER — Inpatient Hospital Stay (HOSPITAL_BASED_OUTPATIENT_CLINIC_OR_DEPARTMENT_OTHER): Payer: No Typology Code available for payment source | Admitting: Medical Oncology

## 2024-02-19 VITALS — BP 118/74 | HR 69 | Temp 98.1°F | Resp 18 | Ht 65.0 in | Wt 231.1 lb

## 2024-02-19 DIAGNOSIS — Z9884 Bariatric surgery status: Secondary | ICD-10-CM | POA: Diagnosis not present

## 2024-02-19 DIAGNOSIS — E559 Vitamin D deficiency, unspecified: Secondary | ICD-10-CM | POA: Insufficient documentation

## 2024-02-19 DIAGNOSIS — D510 Vitamin B12 deficiency anemia due to intrinsic factor deficiency: Secondary | ICD-10-CM

## 2024-02-19 DIAGNOSIS — D508 Other iron deficiency anemias: Secondary | ICD-10-CM

## 2024-02-19 DIAGNOSIS — L509 Urticaria, unspecified: Secondary | ICD-10-CM | POA: Diagnosis not present

## 2024-02-19 DIAGNOSIS — K912 Postsurgical malabsorption, not elsewhere classified: Secondary | ICD-10-CM | POA: Diagnosis not present

## 2024-02-19 DIAGNOSIS — D500 Iron deficiency anemia secondary to blood loss (chronic): Secondary | ICD-10-CM

## 2024-02-19 LAB — VITAMIN B12: Vitamin B-12: 507 pg/mL (ref 180–914)

## 2024-02-19 LAB — CBC
HCT: 43.1 % (ref 36.0–46.0)
Hemoglobin: 13.6 g/dL (ref 12.0–15.0)
MCH: 28.8 pg (ref 26.0–34.0)
MCHC: 31.6 g/dL (ref 30.0–36.0)
MCV: 91.1 fL (ref 80.0–100.0)
Platelets: 271 10*3/uL (ref 150–400)
RBC: 4.73 MIL/uL (ref 3.87–5.11)
RDW: 13.5 % (ref 11.5–15.5)
WBC: 5.6 10*3/uL (ref 4.0–10.5)
nRBC: 0 % (ref 0.0–0.2)

## 2024-02-19 LAB — RETIC PANEL
Immature Retic Fract: 3.4 % (ref 2.3–15.9)
RBC.: 4.67 MIL/uL (ref 3.87–5.11)
Retic Count, Absolute: 47.2 10*3/uL (ref 19.0–186.0)
Retic Ct Pct: 1 % (ref 0.4–3.1)
Reticulocyte Hemoglobin: 32.4 pg (ref >27.9)

## 2024-02-19 LAB — VITAMIN D 25 HYDROXY (VIT D DEFICIENCY, FRACTURES): Vit D, 25-Hydroxy: 53.12 ng/mL (ref 30–100)

## 2024-02-19 LAB — IRON AND IRON BINDING CAPACITY (CC-WL,HP ONLY)
Iron: 137 ug/dL (ref 28–170)
Saturation Ratios: 39 % — ABNORMAL HIGH (ref 10.4–31.8)
TIBC: 356 ug/dL (ref 250–450)
UIBC: 219 ug/dL (ref 148–442)

## 2024-02-19 LAB — FERRITIN: Ferritin: 137 ng/mL (ref 11–307)

## 2024-02-19 MED ORDER — PREDNISONE 10 MG PO TABS: 10.0000 mg | ORAL_TABLET | Freq: Every day | ORAL | 0 refills | Status: AC

## 2024-02-19 MED ORDER — CYANOCOBALAMIN 1000 MCG/ML IJ SOLN: INTRAMUSCULAR | 2 refills | Status: AC

## 2024-02-19 NOTE — Progress Notes (Signed)
 Hematology and Oncology Follow Up Visit  Sheri [name] [id number] 08-19-1976 48 y.o. 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   Today she states that she has been well. Her only concern is a rash that she gets yearly when the pollen levels are high. She has tried her normal regimen of Singulair, Xyzol, benadryl which helps but has not resolved the rash. Rash is on shoulder, arms, legs. She denies SOB, no fever. She asks for something for this today that is not topical given the location of the rash.   Her last vitamin B12 level was 239 on 12/05/2023. She administers this at home. She needs a refill on syringes   There has been no bleeding to her knowledge: denies epistaxis, gingivitis, hemoptysis, hematemesis, hematuria, melena, excessive bruising, blood donation.   Has been no leg swelling. No chest pain, SOB, peripheral edema, unintentional weight loss.   She is working with GYN regarding her night sweats. GYN feels that these are hormonal related and has her on gabapentin which has been helping to reduce the frequency.   She had a colonoscopy last year.  She is UTD on her mammograms.  She is taking wegovy.   Wt Readings from Last 3 Encounters:  02/19/24 231 lb 1.3 oz (104.8 kg)  01/20/24 232 lb 12.8 oz (105.6 kg)  12/05/23 234 lb 1.3 oz (106.2 kg)    Overall, I would say that her performance status is probably ECOG 0.   Medications:  Allergies as of 02/19/2024       Reactions   Aspirin Other (See Comments)   Pt. Has had gastric bypass surgery.  ASA causes ulcers.         Medication List        Accurate as of February 19, 2024  9:40 AM. If  you have any questions, ask your nurse or doctor.          B-D 3CC LUER-LOK SYR 23GX1-1/2 23G X 1-1/2" 3 ML Misc Generic drug: SYRINGE-NEEDLE (DISP) 3 ML Inject 1 each into the skin every 30 (thirty) days. SMARTSIG:Injection Once a Month   cyanocobalamin 1000 MCG/ML injection Commonly known as: VITAMIN B12 INJECT 1 ML (1,000 MCG TOTAL) INTO THE MUSCLE EVERY 14 DAYS What changed: additional instructions Changed by: Rushie Chestnut   escitalopram 20 MG tablet Commonly known as: Lexapro Take 1 tablet (20 mg total) by mouth daily.   gabapentin 100 MG capsule Commonly known as: Neurontin Take 1 capsule (100 mg total) by mouth at bedtime.   levocetirizine 2.5 MG/5ML solution Commonly known as: XYZAL Take 2.5 mg by mouth every evening.   montelukast 10 MG tablet Commonly known as: SINGULAIR TAKE 1 TABLET BY MOUTH EVERYDAY AT BEDTIME   Norethindrone-Ethinyl Estradiol-Fe 0.8-25 MG-MCG tablet Commonly known as: Kaitlib Fe CHEW 1 TABLET DAILY. (TAKESCONTINUOUS ACTIVE ORAL CONTRACEPTIVE PILLS.)   phentermine 37.5 MG tablet Commonly known as: ADIPEX-P Take 37.5 mg by mouth daily before breakfast.   Vitamin D (Ergocalciferol) 1.25 MG (50000 UNIT) Caps capsule Commonly known as: DRISDOL TAKE 1 CAPSULE BY MOUTH ONE TIME PER WEEK   Wegovy 2.4 MG/0.75ML Soaj Generic drug:  Semaglutide-Weight Management Inject into the skin.        Allergies:  Allergies  Allergen Reactions   Aspirin Other (See Comments)    Pt. Has had gastric bypass surgery.  ASA causes ulcers.     Past Medical History, Surgical history, Social history, and Family History were reviewed and updated.  Review of Systems: Review of Systems  Constitutional: Negative.   HENT: Negative.    Eyes: Negative.   Respiratory: Negative.    Cardiovascular: Negative.   Gastrointestinal: Negative.   Genitourinary: Negative.   Musculoskeletal: Negative.   Skin: Negative.   Neurological: Negative.    Endo/Heme/Allergies: Negative.   Psychiatric/Behavioral: Negative.     Marland Kitchen   Physical Exam:  height is 5\' 5"  (1.651 m) and weight is 231 lb 1.3 oz (104.8 kg). Her oral temperature is 98.1 F (36.7 C). Her blood pressure is 118/74 and her pulse is 69. Her respiration is 18 and oxygen saturation is 100%.   Wt Readings from Last 3 Encounters:  02/19/24 231 lb 1.3 oz (104.8 kg)  01/20/24 232 lb 12.8 oz (105.6 kg)  12/05/23 234 lb 1.3 oz (106.2 kg)    Physical Exam Vitals reviewed.  HENT:     Head: Normocephalic and atraumatic.  Eyes:     Pupils: Pupils are equal, round, and reactive to light.  Cardiovascular:     Rate and Rhythm: Normal rate and regular rhythm.     Heart sounds: Normal heart sounds.  Pulmonary:     Effort: Pulmonary effort is normal.     Breath sounds: Normal breath sounds.  Abdominal:     General: Bowel sounds are normal.     Palpations: Abdomen is soft.  Musculoskeletal:        General: No tenderness or deformity. Normal range of motion.     Cervical back: Normal range of motion.  Lymphadenopathy:     Cervical: No cervical adenopathy.  Skin:    General: Skin is warm and dry.     Findings: No erythema or rash.  Neurological:     Mental Status: She is alert and oriented to person, place, and time.  Psychiatric:        Behavior: Behavior normal.        Thought Content: Thought content normal.        Judgment: Judgment normal.     Lab Results  Component Value Date   WBC 5.6 02/19/2024   HGB 13.6 02/19/2024   HCT 43.1 02/19/2024   MCV 91.1 02/19/2024   PLT 271 02/19/2024   Lab Results  Component Value Date   FERRITIN 107 12/05/2023   IRON 143 12/05/2023   TIBC 353 12/05/2023   UIBC 210 12/05/2023   IRONPCTSAT 41 (H) 12/05/2023   Lab Results  Component Value Date   RETICCTPCT 1.0 02/19/2024   RBC 4.73 02/19/2024   No results found for: "KPAFRELGTCHN", "LAMBDASER", "KAPLAMBRATIO" No results found for: "IGGSERUM", "IGA", "IGMSERUM" No  results found for: "TOTALPROTELP", "ALBUMINELP", "A1GS", "A2GS", "BETS", "BETA2SER", "GAMS", "MSPIKE", "SPEI"   Chemistry      Component Value Date/Time   NA 140 01/20/2024 0929   NA 144 10/08/2017 0812   NA 140 07/10/2017 0838   K 4.4 01/20/2024 0929   K 3.9 10/08/2017 0812   K 3.6 07/10/2017 0838   CL 105 01/20/2024 0929   CL 109 (H) 10/08/2017 0812   CO2 22 01/20/2024 0929   CO2 25 10/08/2017 0812   CO2 23 07/10/2017 0838   BUN 13  01/20/2024 0929   BUN 9 10/08/2017 0812   BUN 9.0 07/10/2017 0838   CREATININE 0.85 01/20/2024 0929   CREATININE 0.75 12/05/2023 0859   CREATININE 0.8 10/08/2017 0812   CREATININE 0.8 07/10/2017 0838      Component Value Date/Time   CALCIUM 9.3 01/20/2024 0929   CALCIUM 9.1 10/08/2017 0812   CALCIUM 9.2 07/10/2017 0838   ALKPHOS 115 01/20/2024 0929   ALKPHOS 78 10/08/2017 0812   ALKPHOS 127 07/10/2017 0838   AST 20 01/20/2024 0929   AST 15 12/05/2023 0859   AST 22 07/10/2017 0838   ALT 15 01/20/2024 0929   ALT 11 12/05/2023 0859   ALT 23 10/08/2017 0812   ALT 22 07/10/2017 0838   BILITOT 0.4 01/20/2024 0929   BILITOT 0.4 12/05/2023 0859   BILITOT 0.34 07/10/2017 0838      Encounter Diagnoses  Name Primary?   Iron deficiency anemia secondary to inadequate dietary iron intake Yes   Pernicious anemia    Vitamin D deficiency     Impression and Plan: Ms. Kirksey is a very pleasant 48 yo [place] American female with iron, B-12 and Vit D deficiency 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   We are gong to increase her B12 to every 2 weeks from monthly to help raise her values.  Sending in 5 day course of low dose prednisone which she will take with food.  Today Hgb is normal at 13.6. Reticulocytes are normal.   RTC 3 months APP, labs (CBC, iron, ferritin, retic, B12)  Rushie Chestnut, PA-C 4/9/20259:40 AM

## 2024-02-22 DIAGNOSIS — Z419 Encounter for procedure for purposes other than remedying health state, unspecified: Secondary | ICD-10-CM | POA: Diagnosis not present

## 2024-03-23 DIAGNOSIS — Z419 Encounter for procedure for purposes other than remedying health state, unspecified: Secondary | ICD-10-CM | POA: Diagnosis not present

## 2024-04-23 DIAGNOSIS — Z419 Encounter for procedure for purposes other than remedying health state, unspecified: Secondary | ICD-10-CM | POA: Diagnosis not present

## 2024-05-20 ENCOUNTER — Inpatient Hospital Stay (HOSPITAL_BASED_OUTPATIENT_CLINIC_OR_DEPARTMENT_OTHER): Admitting: Medical Oncology

## 2024-05-20 ENCOUNTER — Encounter: Payer: Self-pay | Admitting: Medical Oncology

## 2024-05-20 ENCOUNTER — Inpatient Hospital Stay: Attending: Hematology & Oncology

## 2024-05-20 VITALS — BP 104/73 | HR 86 | Temp 98.6°F | Resp 18 | Ht 65.0 in | Wt 225.1 lb

## 2024-05-20 DIAGNOSIS — Z9884 Bariatric surgery status: Secondary | ICD-10-CM | POA: Diagnosis not present

## 2024-05-20 DIAGNOSIS — D508 Other iron deficiency anemias: Secondary | ICD-10-CM | POA: Insufficient documentation

## 2024-05-20 DIAGNOSIS — E538 Deficiency of other specified B group vitamins: Secondary | ICD-10-CM | POA: Diagnosis not present

## 2024-05-20 DIAGNOSIS — D510 Vitamin B12 deficiency anemia due to intrinsic factor deficiency: Secondary | ICD-10-CM

## 2024-05-20 DIAGNOSIS — E559 Vitamin D deficiency, unspecified: Secondary | ICD-10-CM | POA: Insufficient documentation

## 2024-05-20 DIAGNOSIS — K912 Postsurgical malabsorption, not elsewhere classified: Secondary | ICD-10-CM | POA: Insufficient documentation

## 2024-05-20 LAB — CBC
HCT: 40.6 % (ref 36.0–46.0)
Hemoglobin: 13.1 g/dL (ref 12.0–15.0)
MCH: 29 pg (ref 26.0–34.0)
MCHC: 32.3 g/dL (ref 30.0–36.0)
MCV: 89.8 fL (ref 80.0–100.0)
Platelets: 272 K/uL (ref 150–400)
RBC: 4.52 MIL/uL (ref 3.87–5.11)
RDW: 13.4 % (ref 11.5–15.5)
WBC: 5.5 K/uL (ref 4.0–10.5)
nRBC: 0 % (ref 0.0–0.2)

## 2024-05-20 LAB — FERRITIN: Ferritin: 167 ng/mL (ref 11–307)

## 2024-05-20 LAB — VITAMIN B12: Vitamin B-12: 365 pg/mL (ref 180–914)

## 2024-05-20 LAB — IRON AND IRON BINDING CAPACITY (CC-WL,HP ONLY)
Iron: 124 ug/dL (ref 28–170)
Saturation Ratios: 36 % — ABNORMAL HIGH (ref 10.4–31.8)
TIBC: 342 ug/dL (ref 250–450)
UIBC: 218 ug/dL (ref 148–442)

## 2024-05-20 NOTE — Progress Notes (Signed)
 Hematology and Oncology Follow Up Visit  Sheri [name] [id number] [birth year] 48 y.o. 05/20/2024   Principle Diagnosis:  Iron  deficiency anemia secondary to malabsorption - gastric bypass Pernicious anemia    Current Therapy:        IV iron  as indicated- Venofer  200 mg- last dose 09/25/2023 B 12 1,000 mcg SQ injection once every month - self administers- changed to every other week on 02/19/2024   Interim History:  Sheri [name] is here today for follow-up.   Today she states that she has been a bit tired but overall well.   Her last vitamin B12 level was 507 on 02/19/2024. She administers this at home.  There has been no bleeding to her knowledge: denies epistaxis, gingivitis, hemoptysis, hematemesis, hematuria, melena, excessive bruising, blood donation.   Has been no leg swelling. No chest pain, SOB, peripheral edema. She has had intentional weight loss.   She is working with GYN regarding her night sweats. GYN feels that these are hormonal related and has her on gabapentin  which has been helping to reduce the frequency.   She had a colonoscopy last year.  She is UTD on her mammograms.  She is taking wegovy.   Wt Readings from Last 3 Encounters:  05/20/24 225 lb 1.9 oz (102.1 kg)  02/19/24 231 lb 1.3 oz (104.8 kg)  01/20/24 232 lb 12.8 oz (105.6 kg)    Overall, I would say that her performance status is probably ECOG 0.   Medications:  Allergies as of 05/20/2024       Reactions   Aspirin Other (See Comments)   Pt. Has had gastric bypass surgery.  ASA causes ulcers.         Medication List        Accurate as of May 20, 2024  9:13 AM. If you have any questions, ask your nurse or doctor.          B-D 3CC LUER-LOK SYR 23GX1-1/2 23G X 1-1/2 3 ML Misc Generic drug: SYRINGE-NEEDLE (DISP) 3 ML Inject 1 each into the skin every 30 (thirty) days. SMARTSIG:Injection Once a Month   cyanocobalamin  1000 MCG/ML injection Commonly known as: VITAMIN B12 INJECT 1 ML  (1,000 MCG TOTAL) INTO THE MUSCLE EVERY 14 DAYS   escitalopram  20 MG tablet Commonly known as: Lexapro  Take 1 tablet (20 mg total) by mouth daily.   gabapentin  100 MG capsule Commonly known as: Neurontin  Take 1 capsule (100 mg total) by mouth at bedtime.   levocetirizine 2.5 MG/5ML solution Commonly known as: XYZAL Take 2.5 mg by mouth every evening.   montelukast  10 MG tablet Commonly known as: SINGULAIR  TAKE 1 TABLET BY MOUTH EVERYDAY AT BEDTIME   Norethindrone-Ethinyl Estradiol -Fe 0.8-25 MG-MCG tablet Commonly known as: Kaitlib Fe CHEW 1 TABLET DAILY. (TAKESCONTINUOUS ACTIVE ORAL CONTRACEPTIVE PILLS.)   phentermine 37.5 MG tablet Commonly known as: ADIPEX-P Take 37.5 mg by mouth daily before breakfast.   Vitamin D  (Ergocalciferol ) 1.25 MG (50000 UNIT) Caps capsule Commonly known as: DRISDOL  TAKE 1 CAPSULE BY MOUTH ONE TIME PER WEEK   Wegovy 2.4 MG/0.75ML Soaj Generic drug: Semaglutide-Weight Management Inject into the skin.        Allergies:  Allergies  Allergen Reactions   Aspirin Other (See Comments)    Pt. Has had gastric bypass surgery.  ASA causes ulcers.     Past Medical History, Surgical history, Social history, and Family History were reviewed and updated.  Review of Systems: Review of Systems  Constitutional: Negative.   HENT: Negative.  Eyes: Negative.   Respiratory: Negative.    Cardiovascular: Negative.   Gastrointestinal: Negative.   Genitourinary: Negative.   Musculoskeletal: Negative.   Skin: Negative.   Neurological: Negative.   Endo/Heme/Allergies: Negative.   Psychiatric/Behavioral: Negative.     Sheri [name]   Physical Exam:  height is 5' 5 (1.651 m) and weight is 225 lb 1.9 oz (102.1 kg). Her oral temperature is 98.6 F (37 C). Her blood pressure is 104/73 and her pulse is 86. Her respiration is 18 and oxygen saturation is 100%.   Wt Readings from Last 3 Encounters:  05/20/24 225 lb 1.9 oz (102.1 kg)  02/19/24 231 lb 1.3 oz (104.8  kg)  01/20/24 232 lb 12.8 oz (105.6 kg)    Physical Exam Vitals reviewed.  HENT:     Head: Normocephalic and atraumatic.  Eyes:     Pupils: Pupils are equal, round, and reactive to light.  Cardiovascular:     Rate and Rhythm: Normal rate and regular rhythm.     Heart sounds: Normal heart sounds.  Pulmonary:     Effort: Pulmonary effort is normal.     Breath sounds: Normal breath sounds.  Abdominal:     General: Bowel sounds are normal.     Palpations: Abdomen is soft.  Musculoskeletal:        General: No tenderness or deformity. Normal range of motion.     Cervical back: Normal range of motion.  Lymphadenopathy:     Cervical: No cervical adenopathy.  Skin:    General: Skin is warm and dry.     Findings: No erythema or rash.  Neurological:     Mental Status: She is alert and oriented to person, place, and time.  Psychiatric:        Behavior: Behavior normal.        Thought Content: Thought content normal.        Judgment: Judgment normal.     Lab Results  Component Value Date   WBC 5.5 05/20/2024   HGB 13.1 05/20/2024   HCT 40.6 05/20/2024   MCV 89.8 05/20/2024   PLT 272 05/20/2024   Lab Results  Component Value Date   FERRITIN 137 02/19/2024   IRON  137 02/19/2024   TIBC 356 02/19/2024   UIBC 219 02/19/2024   IRONPCTSAT 39 (H) 02/19/2024   Lab Results  Component Value Date   RETICCTPCT 1.0 02/19/2024   RBC 4.52 05/20/2024   No results found for: KPAFRELGTCHN, LAMBDASER, KAPLAMBRATIO No results found for: IGGSERUM, IGA, IGMSERUM No results found for: STEPHANY CARLOTA BENSON MARKEL EARLA JOANNIE DOC VICK, SPEI   Chemistry      Component Value Date/Time   NA 140 01/20/2024 0929   NA 144 10/08/2017 0812   NA 140 07/10/2017 0838   K 4.4 01/20/2024 0929   K 3.9 10/08/2017 0812   K 3.6 07/10/2017 0838   CL 105 01/20/2024 0929   CL 109 (H) 10/08/2017 0812   CO2 22 01/20/2024 0929   CO2 25 10/08/2017 0812   CO2  23 07/10/2017 0838   BUN 13 01/20/2024 0929   BUN 9 10/08/2017 0812   BUN 9.0 07/10/2017 0838   CREATININE 0.85 01/20/2024 0929   CREATININE 0.75 12/05/2023 0859   CREATININE 0.8 10/08/2017 0812   CREATININE 0.8 07/10/2017 0838      Component Value Date/Time   CALCIUM 9.3 01/20/2024 0929   CALCIUM 9.1 10/08/2017 0812   CALCIUM 9.2 07/10/2017 0838   ALKPHOS 115 01/20/2024 0929   ALKPHOS  78 10/08/2017 0812   ALKPHOS 127 07/10/2017 0838   AST 20 01/20/2024 0929   AST 15 12/05/2023 0859   AST 22 07/10/2017 0838   ALT 15 01/20/2024 0929   ALT 11 12/05/2023 0859   ALT 23 10/08/2017 0812   ALT 22 07/10/2017 0838   BILITOT 0.4 01/20/2024 0929   BILITOT 0.4 12/05/2023 0859   BILITOT 0.34 07/10/2017 0838      Encounter Diagnoses  Name Primary?   Iron  deficiency anemia secondary to inadequate dietary iron  intake Yes   Pernicious anemia    Vitamin D  deficiency    Impression and Plan: Ms. Malone is a very pleasant 48 yo African American female with iron , B-12 and Vit D deficiency 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   Today Hgb is normal at 13.1. Iron /B12 studied pending. Will replace as needed  RTC 3 months APP, labs (CBC, iron , ferritin, retic, B12)  Lauraine CHRISTELLA Dais, PA-C 7/9/20259:13 AM

## 2024-05-21 ENCOUNTER — Ambulatory Visit: Payer: Self-pay | Admitting: Medical Oncology

## 2024-05-23 DIAGNOSIS — Z419 Encounter for procedure for purposes other than remedying health state, unspecified: Secondary | ICD-10-CM | POA: Diagnosis not present

## 2024-06-23 DIAGNOSIS — Z419 Encounter for procedure for purposes other than remedying health state, unspecified: Secondary | ICD-10-CM | POA: Diagnosis not present

## 2024-07-24 DIAGNOSIS — Z419 Encounter for procedure for purposes other than remedying health state, unspecified: Secondary | ICD-10-CM | POA: Diagnosis not present

## 2024-08-06 ENCOUNTER — Other Ambulatory Visit (HOSPITAL_BASED_OUTPATIENT_CLINIC_OR_DEPARTMENT_OTHER): Payer: Self-pay | Admitting: Certified Nurse Midwife

## 2024-08-06 DIAGNOSIS — R61 Generalized hyperhidrosis: Secondary | ICD-10-CM

## 2024-08-20 ENCOUNTER — Ambulatory Visit: Payer: Self-pay | Admitting: Medical Oncology

## 2024-08-20 ENCOUNTER — Inpatient Hospital Stay (HOSPITAL_BASED_OUTPATIENT_CLINIC_OR_DEPARTMENT_OTHER): Admitting: Medical Oncology

## 2024-08-20 ENCOUNTER — Inpatient Hospital Stay: Attending: Hematology & Oncology

## 2024-08-20 VITALS — BP 113/59 | HR 95 | Temp 99.0°F | Resp 19 | Ht 65.0 in | Wt 233.1 lb

## 2024-08-20 DIAGNOSIS — D508 Other iron deficiency anemias: Secondary | ICD-10-CM | POA: Insufficient documentation

## 2024-08-20 DIAGNOSIS — E559 Vitamin D deficiency, unspecified: Secondary | ICD-10-CM | POA: Insufficient documentation

## 2024-08-20 DIAGNOSIS — K912 Postsurgical malabsorption, not elsewhere classified: Secondary | ICD-10-CM | POA: Insufficient documentation

## 2024-08-20 DIAGNOSIS — D510 Vitamin B12 deficiency anemia due to intrinsic factor deficiency: Secondary | ICD-10-CM

## 2024-08-20 DIAGNOSIS — E538 Deficiency of other specified B group vitamins: Secondary | ICD-10-CM | POA: Diagnosis present

## 2024-08-20 DIAGNOSIS — D500 Iron deficiency anemia secondary to blood loss (chronic): Secondary | ICD-10-CM | POA: Diagnosis not present

## 2024-08-20 DIAGNOSIS — Z9884 Bariatric surgery status: Secondary | ICD-10-CM | POA: Diagnosis not present

## 2024-08-20 LAB — CBC
HCT: 42.2 % (ref 36.0–46.0)
Hemoglobin: 13.3 g/dL (ref 12.0–15.0)
MCH: 28.5 pg (ref 26.0–34.0)
MCHC: 31.5 g/dL (ref 30.0–36.0)
MCV: 90.4 fL (ref 80.0–100.0)
Platelets: 276 K/uL (ref 150–400)
RBC: 4.67 MIL/uL (ref 3.87–5.11)
RDW: 14.3 % (ref 11.5–15.5)
WBC: 5.1 K/uL (ref 4.0–10.5)
nRBC: 0 % (ref 0.0–0.2)

## 2024-08-20 LAB — IRON AND IRON BINDING CAPACITY (CC-WL,HP ONLY)
Iron: 56 ug/dL (ref 28–170)
Saturation Ratios: 17 % (ref 10.4–31.8)
TIBC: 339 ug/dL (ref 250–450)
UIBC: 283 ug/dL

## 2024-08-20 LAB — VITAMIN B12: Vitamin B-12: 368 pg/mL (ref 180–914)

## 2024-08-20 LAB — FERRITIN: Ferritin: 104 ng/mL (ref 11–307)

## 2024-08-20 NOTE — Progress Notes (Addendum)
 Hematology and Oncology Follow Up Visit  Sheri [name] [id number] [birth year] 48 y.o. 08/20/2024   Principle Diagnosis:  Iron  deficiency anemia secondary to malabsorption - gastric bypass Pernicious anemia    Current Therapy:        IV iron  as indicated- Venofer  200 mg- last dose 09/25/2023 B 12 1,000 mcg SQ injection once every month - self administers- changed to every other week on 02/19/2024   Interim History:  Sheri [name] is here today for follow-up.   Today she states that she has been doing ok  Her last vitamin B12 level was 365 on 05/20/2024. She administers this at home every other week.   There has been no bleeding to her knowledge: gingivitis, hemoptysis, hematemesis, hematuria, melena, excessive bruising, blood donation. She reports a few episodes of nosebleeds which are able to be controlled fairly easily.   Has been no leg swelling. No chest pain, SOB, peripheral edema. She has had intentional weight loss.   She is working with GYN regarding her night sweats. GYN feels that these are hormonal related and had her on gabapentin  which has been helping to reduce the frequency but this caused too much daytime fatigue so she stopped this in August. Fatigue is better.   She had a colonoscopy last year.  She is UTD on her mammograms.  She is taking wegovy.   Wt Readings from Last 3 Encounters:  08/20/24 233 lb 1.9 oz (105.7 kg)  05/20/24 225 lb 1.9 oz (102.1 kg)  02/19/24 231 lb 1.3 oz (104.8 kg)    Overall, I would say that her performance status is probably ECOG 0.   Medications:  Allergies as of 08/20/2024       Reactions   Aspirin Other (See Comments)   Pt. Has had gastric bypass surgery.  ASA causes ulcers.         Medication List        Accurate as of August 20, 2024  9:40 AM. If you have any questions, ask your nurse or doctor.          B-D 3CC LUER-LOK SYR 23GX1-1/2 23G X 1-1/2 3 ML Misc Generic drug: SYRINGE-NEEDLE (DISP) 3 ML Inject 1 each  into the skin every 30 (thirty) days. SMARTSIG:Injection Once a Month   cyanocobalamin  1000 MCG/ML injection Commonly known as: VITAMIN B12 INJECT 1 ML (1,000 MCG TOTAL) INTO THE MUSCLE EVERY 14 DAYS   escitalopram  20 MG tablet Commonly known as: Lexapro  Take 1 tablet (20 mg total) by mouth daily.   gabapentin  100 MG capsule Commonly known as: NEURONTIN  TAKE 1 CAPSULE BY MOUTH AT BEDTIME.   levocetirizine 2.5 MG/5ML solution Commonly known as: XYZAL Take 2.5 mg by mouth every evening.   montelukast  10 MG tablet Commonly known as: SINGULAIR  TAKE 1 TABLET BY MOUTH EVERYDAY AT BEDTIME   Norethindrone-Ethinyl Estradiol -Fe 0.8-25 MG-MCG tablet Commonly known as: Kaitlib Fe CHEW 1 TABLET DAILY. (TAKESCONTINUOUS ACTIVE ORAL CONTRACEPTIVE PILLS.)   phentermine 37.5 MG tablet Commonly known as: ADIPEX-P Take 37.5 mg by mouth daily before breakfast.   Vitamin D  (Ergocalciferol ) 1.25 MG (50000 UNIT) Caps capsule Commonly known as: DRISDOL  TAKE 1 CAPSULE BY MOUTH ONE TIME PER WEEK   Wegovy 2.4 MG/0.75ML Soaj SQ injection Generic drug: semaglutide-weight management Inject into the skin.        Allergies:  Allergies  Allergen Reactions   Aspirin Other (See Comments)    Pt. Has had gastric bypass surgery.  ASA causes ulcers.     Past  Medical History, Surgical history, Social history, and Family History were reviewed and updated.  Review of Systems: Review of Systems  Constitutional: Negative.   HENT: Negative.    Eyes: Negative.   Respiratory: Negative.    Cardiovascular: Negative.   Gastrointestinal: Negative.   Genitourinary: Negative.   Musculoskeletal: Negative.   Skin: Negative.   Neurological: Negative.   Endo/Heme/Allergies: Negative.   Psychiatric/Behavioral: Negative.     Sheri [name]   Physical Exam:  height is 5' 5 (1.651 m) and weight is 233 lb 1.9 oz (105.7 kg). Her oral temperature is 99 F (37.2 C). Her blood pressure is 113/59 (abnormal) and her pulse is  95. Her respiration is 19 and oxygen saturation is 100%.   Wt Readings from Last 3 Encounters:  08/20/24 233 lb 1.9 oz (105.7 kg)  05/20/24 225 lb 1.9 oz (102.1 kg)  02/19/24 231 lb 1.3 oz (104.8 kg)    Physical Exam Vitals reviewed.  HENT:     Head: Normocephalic and atraumatic.  Eyes:     Pupils: Pupils are equal, round, and reactive to light.  Cardiovascular:     Rate and Rhythm: Normal rate and regular rhythm.     Heart sounds: Normal heart sounds.  Pulmonary:     Effort: Pulmonary effort is normal.     Breath sounds: Normal breath sounds.  Abdominal:     General: Bowel sounds are normal.     Palpations: Abdomen is soft.  Musculoskeletal:        General: No tenderness or deformity. Normal range of motion.     Cervical back: Normal range of motion.  Lymphadenopathy:     Cervical: No cervical adenopathy.  Skin:    General: Skin is warm and dry.     Findings: No erythema or rash.  Neurological:     Mental Status: She is alert and oriented to person, place, and time.  Psychiatric:        Behavior: Behavior normal.        Thought Content: Thought content normal.        Judgment: Judgment normal.     Lab Results  Component Value Date   WBC 5.1 08/20/2024   HGB 13.3 08/20/2024   HCT 42.2 08/20/2024   MCV 90.4 08/20/2024   PLT 276 08/20/2024   Lab Results  Component Value Date   FERRITIN 167 05/20/2024   IRON  124 05/20/2024   TIBC 342 05/20/2024   UIBC 218 05/20/2024   IRONPCTSAT 36 (H) 05/20/2024   Lab Results  Component Value Date   RETICCTPCT 1.0 02/19/2024   RBC 4.67 08/20/2024   No results found for: KPAFRELGTCHN, LAMBDASER, KAPLAMBRATIO No results found for: IGGSERUM, IGA, IGMSERUM No results found for: STEPHANY CARLOTA BENSON MARKEL EARLA JOANNIE DOC VICK, SPEI   Chemistry      Component Value Date/Time   NA 140 01/20/2024 0929   NA 144 10/08/2017 0812   NA 140 07/10/2017 0838   K 4.4 01/20/2024 0929    K 3.9 10/08/2017 0812   K 3.6 07/10/2017 0838   CL 105 01/20/2024 0929   CL 109 (H) 10/08/2017 0812   CO2 22 01/20/2024 0929   CO2 25 10/08/2017 0812   CO2 23 07/10/2017 0838   BUN 13 01/20/2024 0929   BUN 9 10/08/2017 0812   BUN 9.0 07/10/2017 0838   CREATININE 0.85 01/20/2024 0929   CREATININE 0.75 12/05/2023 0859   CREATININE 0.8 10/08/2017 0812   CREATININE 0.8 07/10/2017 9161  Component Value Date/Time   CALCIUM 9.3 01/20/2024 0929   CALCIUM 9.1 10/08/2017 0812   CALCIUM 9.2 07/10/2017 0838   ALKPHOS 115 01/20/2024 0929   ALKPHOS 78 10/08/2017 0812   ALKPHOS 127 07/10/2017 0838   AST 20 01/20/2024 0929   AST 15 12/05/2023 0859   AST 22 07/10/2017 0838   ALT 15 01/20/2024 0929   ALT 11 12/05/2023 0859   ALT 23 10/08/2017 0812   ALT 22 07/10/2017 0838   BILITOT 0.4 01/20/2024 0929   BILITOT 0.4 12/05/2023 0859   BILITOT 0.34 07/10/2017 0838      Encounter Diagnoses  Name Primary?   Iron  deficiency anemia secondary to inadequate dietary iron  intake Yes   Pernicious anemia    Iron  deficiency anemia due to chronic blood loss     Impression and Plan: Ms. Knabe is a very pleasant 48 yo [place] American female with iron , B-12 and Vit D deficiency 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   Today Hgb is normal at 13.3. Iron /B12 studied pending. Will replace as needed  RTC 3 months APP, labs (CBC, iron , ferritin, retic, B12)  Lauraine CHRISTELLA Dais, PA-C 10/9/20259:40 AM

## 2024-09-30 ENCOUNTER — Encounter: Payer: Self-pay | Admitting: Family

## 2024-10-05 ENCOUNTER — Encounter: Payer: Self-pay | Admitting: Family

## 2024-10-21 ENCOUNTER — Other Ambulatory Visit (HOSPITAL_BASED_OUTPATIENT_CLINIC_OR_DEPARTMENT_OTHER): Payer: Self-pay | Admitting: Certified Nurse Midwife

## 2024-10-21 ENCOUNTER — Other Ambulatory Visit (HOSPITAL_COMMUNITY)
Admission: RE | Admit: 2024-10-21 | Discharge: 2024-10-21 | Disposition: A | Discharge status: Home / Self Care | Source: Ambulatory Visit | Attending: Certified Nurse Midwife | Admitting: Certified Nurse Midwife

## 2024-10-21 ENCOUNTER — Encounter (HOSPITAL_BASED_OUTPATIENT_CLINIC_OR_DEPARTMENT_OTHER): Payer: Self-pay | Admitting: Certified Nurse Midwife

## 2024-10-21 ENCOUNTER — Ambulatory Visit (HOSPITAL_BASED_OUTPATIENT_CLINIC_OR_DEPARTMENT_OTHER): Admitting: Certified Nurse Midwife

## 2024-10-21 VITALS — BP 117/76 | HR 87 | Ht 65.5 in | Wt 230.2 lb

## 2024-10-21 DIAGNOSIS — Z23 Encounter for immunization: Secondary | ICD-10-CM

## 2024-10-21 DIAGNOSIS — Z01419 Encounter for gynecological examination (general) (routine) without abnormal findings: Secondary | ICD-10-CM | POA: Diagnosis not present

## 2024-10-21 DIAGNOSIS — Z1151 Encounter for screening for human papillomavirus (HPV): Secondary | ICD-10-CM | POA: Diagnosis not present

## 2024-10-21 DIAGNOSIS — Z3041 Encounter for surveillance of contraceptive pills: Secondary | ICD-10-CM | POA: Insufficient documentation

## 2024-10-21 DIAGNOSIS — Z793 Long term (current) use of hormonal contraceptives: Secondary | ICD-10-CM | POA: Diagnosis not present

## 2024-10-21 DIAGNOSIS — Z124 Encounter for screening for malignant neoplasm of cervix: Secondary | ICD-10-CM

## 2024-10-21 DIAGNOSIS — Z6837 Body mass index (BMI) 37.0-37.9, adult: Secondary | ICD-10-CM

## 2024-10-21 DIAGNOSIS — F341 Dysthymic disorder: Secondary | ICD-10-CM

## 2024-10-21 DIAGNOSIS — E559 Vitamin D deficiency, unspecified: Secondary | ICD-10-CM

## 2024-10-21 MED ORDER — NORETHIN-ETH ESTRADIOL-FE 0.8-25 MG-MCG PO CHEW: CHEWABLE_TABLET | ORAL | 4 refills | Status: AC

## 2024-10-21 MED ORDER — ESCITALOPRAM OXALATE 20 MG PO TABS: 20.0000 mg | ORAL_TABLET | Freq: Every day | ORAL | 3 refills | Status: AC

## 2024-10-21 NOTE — Progress Notes (Signed)
 48 y.o. G88P0020 Single Black or African American female here for annual exam.  She states she is taking oral contraceptives and does not have a period. Denies vaginal spotting or bleeding. Pt reports that she does skip a week of COC and does not take an active pill daily. During the placebo week she does not experience vaginal spotting or bleeding.  Patient's last menstrual period was 11/12/2010 (approximate).          Sexually active: No.  The current method of family planning is none.    Exercising: Yes.    Gym/ health club routine includes light weights and walking on track . Smoker:  no  Health Maintenance: Pap:  11/02/2020 Negative History of abnormal Pap:  no MMG:  08/09/2023 Negative Colonoscopy:  02/06/2021 BMD:   n/a Screening Labs: PCP   reports that she has never smoked. She has never used smokeless tobacco. She reports that she does not drink alcohol and does not use drugs.  Past Medical History:  Diagnosis Date   Allergy    seasonal allergies   Anemia    and low B12-on meds   Family history of adverse reaction to anesthesia    GERD (gastroesophageal reflux disease)    with certain foods/due to gastric bypass   Heart murmur    as child - no problems as adult   History of blood transfusion 2004   [facility], after surgery   Hypothyroidism    history=resolved   Inflammation of joint of knee    right knee-on anti-inflammatory, arthritis   Seasonal allergies    Sleep apnea    Vitamin B 12 deficiency     Past Surgical History:  Procedure Laterality Date   ABDOMINOPLASTY  2004   BREAST REDUCTION SURGERY  2005   CHOLECYSTECTOMY N/A 12/07/2022   Procedure: LAPAROSCOPIC CHOLECYSTECTOMY;  Surgeon: Paola Dreama SAILOR, MD;  Location: [facility] OR;  Service: General;  Laterality: N/A;   COLONOSCOPY  2023   GASTRIC BYPASS  2001   Dr Lenda, in [place]   LAPAROSCOPIC OVARIAN CYSTECTOMY Right 05/17/2014   Procedure: LAPAROSCOPIC  partial  right  salpingo-oohorectomy  and cystoscopy;  Surgeon: Ronal Elvie Pinal, MD;  Location: [facility];  Service: Gynecology;  Laterality: Right;   REDUCTION MAMMAPLASTY Bilateral 2004   WISDOM TOOTH EXTRACTION  2014   upper right    Current Outpatient Medications  Medication Sig Dispense Refill   B-D 3CC LUER-LOK SYR 23GX1-1/2 23G X 1-1/2 3 ML MISC Inject 1 each into the skin every 30 (thirty) days. SMARTSIG:Injection Once a Month 50 each 0   cyanocobalamin  (VITAMIN B12) 1000 MCG/ML injection INJECT 1 ML (1,000 MCG TOTAL) INTO THE MUSCLE EVERY 14 DAYS 10 mL 2   escitalopram  (LEXAPRO ) 20 MG tablet Take 1 tablet (20 mg total) by mouth daily. 90 tablet 5   gabapentin  (NEURONTIN ) 100 MG capsule TAKE 1 CAPSULE BY MOUTH AT BEDTIME. 90 capsule 0   levocetirizine (XYZAL) 2.5 MG/5ML solution Take 2.5 mg by mouth every evening.     montelukast  (SINGULAIR ) 10 MG tablet TAKE 1 TABLET BY MOUTH EVERYDAY AT BEDTIME 90 tablet 4   Norethindrone-Ethinyl Estradiol -Fe (KAITLIB FE) 0.8-25 MG-MCG tablet CHEW 1 TABLET DAILY. (TAKESCONTINUOUS ACTIVE ORAL CONTRACEPTIVE PILLS.) 112 tablet 4   phentermine (ADIPEX-P) 37.5 MG tablet Take 37.5 mg by mouth daily before breakfast.     Vitamin D , Ergocalciferol , (DRISDOL ) 1.25 MG (50000 UNIT) CAPS capsule TAKE 1 CAPSULE BY MOUTH ONE TIME PER WEEK 12 capsule 4  WEGOVY 2.4 MG/0.75ML SOAJ Inject into the skin.     No current facility-administered medications for this visit.    Family History  Problem Relation Age of Onset   Stroke Sister        stroke after cancer diagnosis around age 64   Diabetes Sister    Hypertension Sister    Breast cancer Sister 55   Diabetes Sister    Hypertension Sister    Hypertension Brother    Diabetes Brother    Heart disease Brother    Diabetes Brother    Hypertension Brother    Heart disease Brother    Diabetes Brother    Hypertension Brother    Heart disease Brother    CVA Maternal Grandmother    Colon polyps Neg Hx    Colon cancer Neg  Hx    Esophageal cancer Neg Hx    Rectal cancer Neg Hx    Stomach cancer Neg Hx     ROS: Constitutional: negative; Pt does report hot flashes/night sweats (less than 1 year ago but still bothersome). Genitourinary:negative  Exam:   LMP 11/12/2010 (Approximate)      General appearance: alert, cooperative and appears stated age Head: Normocephalic, without obvious abnormality, atraumatic Breasts: normal appearance, no masses or tenderness, Inspection negative, No nipple retraction or dimpling, No nipple discharge or bleeding, No axillary or supraclavicular adenopathy, Normal to palpation without dominant masses, Scars present from prior breast surgery Heart: regular rate and rhythm Abdomen: soft, non-tender; bowel sounds normal; no masses,  no organomegaly Extremities: extremities normal, atraumatic, no cyanosis or edema Skin: Skin color, texture, turgor normal. No rashes or lesions Lymph nodes: Cervical, supraclavicular, and axillary nodes normal. No abnormal inguinal nodes palpated Neurologic: Grossly normal   Pelvic: External genitalia:  no lesions              Urethra:  normal appearing urethra with no masses, tenderness or lesions              Bartholins and Skenes: normal                 Vagina: normal appearing vagina with normal color and no discharge, no lesions              Cervix: no bleeding following Pap, no cervical motion tenderness, and no lesions              Pap taken: Yes.   Bimanual Exam:  Uterus:  normal size, contour, position, consistency, mobility, non-tender              Adnexa: normal adnexa and no mass, fullness, tenderness               Rectovaginal: Confirms               Anus:  normal sphincter tone, no lesions  Chaperone,  CMA, was present for exam.  Assessment/Plan:  1. Well woman exam with routine gynecological exam (Primary) - Breast self awareness encouraged. Annual screening mammograms recommended. - Norethindrone-Ethinyl Estradiol -Fe  (KAITLIB FE) 0.8-25 MG-MCG tablet; CHEW 1 TABLET DAILY. (TAKESCONTINUOUS ACTIVE ORAL CONTRACEPTIVE PILLS.)  Dispense: 112 tablet; Refill: 4 - Colonoscopy UTD - Discussed recommendation for Shingles Vaccine (age 20). Pt will consider.   2. Cervical cancer screening - Pt desires routine STI Screening on pap smear - Cytology - PAP( )  3. Flu vaccine need - Pt desires Flu Vaccine today - Flu vaccine trivalent PF, 6mos and older(Flulaval,Afluria,Fluarix,Fluzone)  4. Dysthymia - escitalopram  (LEXAPRO )  20 MG tablet; Take 1 tablet (20 mg total) by mouth daily.  Dispense: 90 tablet; Refill: 3  5. Surveillance for birth control, oral contraceptives - Reviewed w/ patient that COC was ordered extended regimen (skip placebos).  RTO 1 year for annual gyn exam and prn if issues arise. Arland MARLA Roller

## 2024-10-23 LAB — CYTOLOGY - PAP
Adequacy: ABSENT
Chlamydia: NEGATIVE
Comment: NEGATIVE
Comment: NEGATIVE
Comment: NEGATIVE
Comment: NORMAL
Diagnosis: NEGATIVE
High risk HPV: NEGATIVE
Neisseria Gonorrhea: NEGATIVE
Trichomonas: NEGATIVE

## 2024-10-26 ENCOUNTER — Ambulatory Visit (HOSPITAL_BASED_OUTPATIENT_CLINIC_OR_DEPARTMENT_OTHER): Payer: Self-pay | Admitting: Certified Nurse Midwife

## 2024-11-10 ENCOUNTER — Encounter: Payer: Self-pay | Admitting: Family

## 2024-11-12 ENCOUNTER — Encounter: Payer: Self-pay | Admitting: Family

## 2024-11-17 ENCOUNTER — Inpatient Hospital Stay: Payer: Self-pay | Attending: Hematology & Oncology

## 2024-11-17 ENCOUNTER — Inpatient Hospital Stay: Payer: Self-pay | Admitting: Family

## 2024-12-10 ENCOUNTER — Encounter: Payer: Self-pay | Admitting: Family

## 2024-12-16 ENCOUNTER — Other Ambulatory Visit (HOSPITAL_BASED_OUTPATIENT_CLINIC_OR_DEPARTMENT_OTHER): Payer: Self-pay

## 2024-12-16 ENCOUNTER — Encounter: Payer: Self-pay | Admitting: Family

## 2024-12-16 MED ORDER — SHINGRIX 50 MCG/0.5ML IM SUSR
0.5000 mL | Freq: Once | INTRAMUSCULAR | 0 refills | Status: AC
  Filled 2024-12-16: qty 0.5, 1d supply, fill #0

## 2025-01-04 ENCOUNTER — Inpatient Hospital Stay: Payer: Self-pay | Admitting: Family

## 2025-01-04 ENCOUNTER — Inpatient Hospital Stay: Payer: Self-pay

## 2025-10-21 ENCOUNTER — Ambulatory Visit (HOSPITAL_BASED_OUTPATIENT_CLINIC_OR_DEPARTMENT_OTHER): Payer: No Typology Code available for payment source | Admitting: Certified Nurse Midwife
